# Patient Record
Sex: Female | Born: 2000
Health system: Southern US, Community
[De-identification: ages and names within clinical notes are randomized; demographics above are authoritative.]

## PROBLEM LIST (undated history)

## (undated) DIAGNOSIS — G40909 Epilepsy, unspecified, not intractable, without status epilepticus: Secondary | ICD-10-CM

## (undated) DIAGNOSIS — F32A Depression, unspecified: Secondary | ICD-10-CM

## (undated) DIAGNOSIS — R569 Unspecified convulsions: Secondary | ICD-10-CM

## (undated) HISTORY — PX: APPENDECTOMY (OPEN): SHX54

## (undated) HISTORY — DX: Depression, unspecified: F32.A

## (undated) HISTORY — DX: Epilepsy, unspecified, not intractable, without status epilepticus: G40.909

---
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
## 2020-05-30 NOTE — Congregational Nurse Program (Signed)
Patient mother came to see me requesting to see a doctor. States that patient has a "Cyst in the brain". Co-Sponsor reports developmental delay. Family requesting a referral to neurologist. I will call internal medicine to establish care with PCP. [name] RN BSn PCCN  [facility] [phone]-office

## 2020-06-20 ENCOUNTER — Ambulatory Visit (INDEPENDENT_AMBULATORY_CARE_PROVIDER_SITE_OTHER): Payer: Medicaid Other | Admitting: Student

## 2020-06-20 VITALS — BP 103/61 | HR 89 | Temp 98.7°F | Wt 117.7 lb

## 2020-06-20 DIAGNOSIS — Z0000 Encounter for general adult medical examination without abnormal findings: Secondary | ICD-10-CM

## 2020-06-20 DIAGNOSIS — G40909 Epilepsy, unspecified, not intractable, without status epilepticus: Secondary | ICD-10-CM

## 2020-06-20 DIAGNOSIS — F32A Depression, unspecified: Secondary | ICD-10-CM | POA: Diagnosis not present

## 2020-06-20 NOTE — Progress Notes (Signed)
   CC: Establish care, falls and shaking episodes   HPI:  [name] is a 20 y.o.with history below presents with mother and sponsor to establish as a new patient. Interview conducted with interpreter in the room. Please refer to problem based charting for further details and assessment and plan of current problem and chronic medical conditions.    Past Medical History:  Diagnosis Date  . Depression   . Seizure disorder (HCC)    Review of Systems:  Review of Systems  Constitutional: Negative for chills and fever.  HENT: Negative for congestion and sore throat.   Eyes: Positive for blurred vision. Negative for double vision and photophobia.  Respiratory: Negative for cough, shortness of breath and wheezing.   Cardiovascular: Negative for chest pain, palpitations and leg swelling.  Gastrointestinal: Negative for abdominal pain, blood in stool, diarrhea, nausea and vomiting.  Genitourinary: Negative for dysuria, frequency and hematuria.  Musculoskeletal: Negative for myalgias and neck pain.  Neurological: Positive for seizures and loss of consciousness. Negative for dizziness, sensory change, speech change, focal weakness and headaches.  Psychiatric/Behavioral: Positive for memory loss. Negative for depression and suicidal ideas. The patient does not have insomnia.      Physical Exam:  Vitals:   06/20/20 1000  BP: 103/61  Pulse: 89  Temp: 98.7 F (37.1 C)  TempSrc: Oral  SpO2: 100%  Weight: 117 lb 11.6 oz (53.4 kg)   Physical Exam Constitutional:      Appearance: Normal appearance.  HENT:     Head: Normocephalic and atraumatic.     Right Ear: External ear normal.     Left Ear: External ear normal.     Mouth/Throat:     Mouth: Mucous membranes are moist.     Pharynx: Oropharynx is clear.  Eyes:     Extraocular Movements: Extraocular movements intact.     Pupils: Pupils are equal, round, and reactive to light.  Cardiovascular:     Rate and Rhythm: Normal rate and  regular rhythm.     Pulses: Normal pulses.     Heart sounds: Normal heart sounds. No murmur heard.   Pulmonary:     Effort: Pulmonary effort is normal.     Breath sounds: Normal breath sounds. No wheezing.  Abdominal:     General: Abdomen is flat. Bowel sounds are normal. There is no distension.     Palpations: Abdomen is soft.     Tenderness: There is no abdominal tenderness.  Musculoskeletal:        General: No deformity or signs of injury. Normal range of motion.     Cervical back: Neck supple.     Right lower leg: No edema.     Left lower leg: No edema.  Neurological:     General: No focal deficit present.     Mental Status: She is alert and oriented to person, place, and time.     Cranial Nerves: No cranial nerve deficit.     Sensory: No sensory deficit.     Motor: No weakness.     Coordination: Coordination normal.     Gait: Gait normal.     Deep Tendon Reflexes: Reflexes normal.  Psychiatric:        Mood and Affect: Mood normal.        Behavior: Behavior normal.      Assessment & Plan:   See Encounters Tab for problem based charting.  Patient discussed with Dr. Antony Contras

## 2020-06-20 NOTE — Patient Instructions (Addendum)
It was a pleasure meeting you in clinic.   Today we discussed falls and shaking: I am concerned Sara Moon is having seizures that is causing this. Once we have her insurance information we will refer her to see a Neurologist and have and MRI. I have ordered lab work today and will call you with the results. I will start her on medication depending on the lab results and we will talk about it when I call you.  Please bring you paperwork for vaccines and other tests to you next visit. Please bring your insurance information if you have it too.  We can discuss having a Pap smear at our next visit as well.  Please come back for a visit in 2 weeks.   If you have any questions or concerns, please call our clinic at [phone] between 9am-5pm and after hours call [phone] and ask for the internal medicine resident on call. If you feel you are having a medical emergency please call 911.   Thank you, we look forward to helping you remain healthy!

## 2020-06-21 LAB — BMP8+ANION GAP
Anion Gap: 16 mmol/L (ref 10.0–18.0)
BUN/Creatinine Ratio: 23 (ref 9–23)
BUN: 14 mg/dL (ref 6–20)
CO2: 22 mmol/L (ref 20–29)
Calcium: 10.1 mg/dL (ref 8.7–10.2)
Chloride: 103 mmol/L (ref 96–106)
Creatinine, Ser: 0.6 mg/dL (ref 0.57–1.00)
GFR calc Af Amer: 151 mL/min/{1.73_m2} (ref >59)
GFR calc non Af Amer: 131 mL/min/{1.73_m2} (ref >59)
Glucose: 90 mg/dL (ref 65–99)
Potassium: 4.6 mmol/L (ref 3.5–5.2)
Sodium: 141 mmol/L (ref 134–144)

## 2020-06-21 MED ORDER — LEVETIRACETAM 500 MG PO TABS: 500.0000 mg | ORAL_TABLET | Freq: Two times a day (BID) | ORAL | 2 refills | Status: DC

## 2020-06-21 MED FILL — levETIRAcetam 500 MG TABS: 500 | 30 days supply | Qty: 60 | Fill #0

## 2020-06-23 ENCOUNTER — Encounter: Payer: Self-pay | Admitting: Student

## 2020-06-23 DIAGNOSIS — Z0000 Encounter for general adult medical examination without abnormal findings: Secondary | ICD-10-CM | POA: Insufficient documentation

## 2020-06-23 DIAGNOSIS — F32A Depression, unspecified: Secondary | ICD-10-CM | POA: Insufficient documentation

## 2020-06-23 DIAGNOSIS — G40909 Epilepsy, unspecified, not intractable, without status epilepticus: Secondary | ICD-10-CM | POA: Insufficient documentation

## 2020-06-23 NOTE — Assessment & Plan Note (Signed)
Patient with history of depression and prescribed medication for this while in [place] 2 years ago. Did not take the medications. Mother is concerned that patient is not as outgoing and prefers bing by self. Today states she is a happy person with much family support and positive outlook on life. Denies feeling depressed. States she has difficulty with criticism from family and is sometimes upset easily. Will revisit this as she adjusts to new living situation and have her complete PHQ9 and GAD at next visit.

## 2020-06-23 NOTE — Assessment & Plan Note (Signed)
Reports vaccines administered when entering country will bring records to next visit  Deferred pap smear at this visit, will revisit this

## 2020-06-23 NOTE — Assessment & Plan Note (Signed)
Patient is a refugee from [place] who presents with mother and sponsor today to establish care. Per mother patient has had episodes since she was 20 years old where she falls and has lapses in her memory. Patient states she sometimes feels that her vision is darkening and she suddenly become SOB with leg tingling prior to this, which is very frightening to her. She often has no memory of these episodes and is surprised when family talks bout it later. This most recently happened yesterday after her mother heard a thud in another room and found the patient laying on her back.. These episodes happen 1-2 times a day as last between 2-5 minutes. Today she denies any pain or injuries from falling. Generally mother tries to keep near her and keep her from falling and injuring herself.   Her mother also notes that's she also has episodes where she observes her daughter with shaking in her right arm and patient is unresponsive, during this time patient has had urinary incontinence. After the shaking stops patient has not knowledge of this and is confused by what has happened. This also happens about once a day. Mother feels that the patient has more episodes when patient is excited or agitated and staying calm helps prevent them.  Patient has multiple tests for these episodes including a heart cath in 2008 that was negative. She had a MRI in 2019 in [place] showing a 79mm small subarachnoid cyst. Mother was told to keep the patient calm but otherwise not started on any medications. While living in a refugee camp in [place] between August and November of 2021 patient was taken to a hospital 5 times for her episodes of shaking, mother states no workup was done due her refugee status.  Patient denies chest pain, dyspnea, exertional SOB, nausea, vomiting, weakness, dizziness, or headache. Denies prior facial injuries or fractures from falling or tongue biting with shaking episodes. Given her lack of awareness of the  shaking episodes and urinary incontinence suspect this may be a seizure disorder vs pseudoseizures. Do not think this is a cardiac issues. Patient currently uninsured but anticipates getting medicaid soon. Will check renal function and start on keppra if normal. Further workup with MRI and referral to neurology once she get insurance.   Plan BMP today wnl Start keppra 500 mg twice daily Follow up in 2 weeks

## 2020-06-25 NOTE — Progress Notes (Signed)
Internal Medicine Attending Note:  This patient's plan of care was discussed with the house staff. Please see their note for complete details. I concur with their findings.  Patient is here with history very concerning for seizures / epilepsy. She has frequent daily episodes of unresponsiveness with associated rhythmic shaking and urinary incontinence. Episodes started at age 20 and usually last a couple of minutes. History is limited due to language barrier but it sounds as if she might be post ictal following episodes as well. She has no memory of event afterwards. She is a refugee from [place] and currently uninsured. She is in the process of applying for medicaid. Pending approval, she will need urgent referral to neurology and neuroimaging. She did have an MRI done in [place] in 2019 and mother brought a copy of the report. 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 We have started Keppra BID today ($4 list at [facility] outpatient pharmacy) and she will need close follow up.   Reymundo Poll, MD 06/25/2020, 11:00 AM

## 2020-07-04 ENCOUNTER — Ambulatory Visit (INDEPENDENT_AMBULATORY_CARE_PROVIDER_SITE_OTHER): Payer: Medicaid Other | Admitting: Student

## 2020-07-04 ENCOUNTER — Other Ambulatory Visit: Payer: Self-pay

## 2020-07-04 ENCOUNTER — Other Ambulatory Visit: Payer: Self-pay | Admitting: Student

## 2020-07-04 ENCOUNTER — Encounter: Payer: Self-pay | Admitting: Student

## 2020-07-04 VITALS — BP 109/67 | HR 64 | Temp 98.4°F | Ht 65.0 in | Wt 116.1 lb

## 2020-07-04 DIAGNOSIS — Z0000 Encounter for general adult medical examination without abnormal findings: Secondary | ICD-10-CM

## 2020-07-04 DIAGNOSIS — G40909 Epilepsy, unspecified, not intractable, without status epilepticus: Secondary | ICD-10-CM

## 2020-07-04 MED ORDER — DIVALPROEX SODIUM 500 MG PO DR TAB: 500.0000 mg | DELAYED_RELEASE_TABLET | Freq: Two times a day (BID) | ORAL | 2 refills | Status: DC

## 2020-07-04 MED FILL — DIVALPROEX SOD DR 500 MG TA: 500 | 30 days supply | Qty: 60 | Fill #0

## 2020-07-04 NOTE — Progress Notes (Signed)
   CC: follow up of seizure like episodes  HPI:  [name] is a 20 y.o. with history below presents for follow up of seizure like episodes. Please refer to problem based charting for further details and assessment and plan of current problem and chronic medical conditions.   Past Medical History:  Diagnosis Date  . Concern for seizure disorder (HCC)   . Depression    Review of Systems:  Negative as per HPI  Physical Exam:  Vitals:   07/04/20 0918 07/04/20 1016  BP: (!) 82/66 109/67  Pulse: 86 64  Temp: 98.4 F (36.9 C)   TempSrc: Oral   SpO2: 100%   Weight: 116 lb 1.6 oz (52.7 kg)   Height: 5\' 5"  (1.651 m)    Physical Exam Constitutional:      Appearance: Normal appearance.  HENT:     Head: Normocephalic and atraumatic.     Right Ear: External ear normal.     Left Ear: External ear normal.     Mouth/Throat:     Mouth: Mucous membranes are moist.     Pharynx: Oropharynx is clear.  Eyes:     Extraocular Movements: Extraocular movements intact.     Pupils: Pupils are equal, round, and reactive to light.  Cardiovascular:     Rate and Rhythm: Normal rate and regular rhythm.     Pulses: Normal pulses.     Heart sounds: Normal heart sounds.  Pulmonary:     Effort: Pulmonary effort is normal.     Breath sounds: Normal breath sounds. No wheezing.  Abdominal:     General: Abdomen is flat. Bowel sounds are normal.     Palpations: Abdomen is soft.  Musculoskeletal:        General: No signs of injury. Normal range of motion.  Skin:    General: Skin is warm and dry.     Capillary Refill: Capillary refill takes less than 2 seconds.  Neurological:     Mental Status: She is alert and oriented to person, place, and time.     Cranial Nerves: Dysarthria present.     Sensory: Sensation is intact.     Motor: Motor function is intact. No weakness.     Coordination: Romberg sign negative. Coordination normal.     Gait: Gait abnormal (intermittent squats down when stepping  with the right foot).     Deep Tendon Reflexes:     Reflex Scores:      Bicep reflexes are 2+ on the right side and 2+ on the left side.      Brachioradialis reflexes are 2+ on the right side and 2+ on the left side.      Patellar reflexes are 2+ on the right side and 2+ on the left side.      Achilles reflexes are 2+ on the right side and 2+ on the left side.    Comments: Multiple episodes where patient jerks both arms over hands and extends her next during exam.   Psychiatric:        Behavior: Behavior normal.      Assessment & Plan:   See Encounters Tab for problem based charting.  Patient discussed with Dr. 

## 2020-07-04 NOTE — Patient Instructions (Addendum)
It was a pleasure seeing you in clinic. Today we discussed:   Seizure episodes: Please start taking depakote 500 mg twice a day. Do not take anymore of the keppra from before.   I have sent a referral to neurology to further discuss your shaking episodes.  I have ordered labs to monitor you liver function and you will need to follow up in 1 to 2 weeks to recheck your liver function.   If you have any questions or concerns, please call our clinic at [phone] between 9am-5pm and after hours call [phone] and ask for the internal medicine resident on call. If you feel you are having a medical emergency please call 911.   Thank you, we look forward to helping you remain healthy!

## 2020-07-05 LAB — CMP14 + ANION GAP
ALT: 7 IU/L (ref 0–32)
AST: 17 IU/L (ref 0–40)
Albumin/Globulin Ratio: 2.3 — ABNORMAL HIGH (ref 1.2–2.2)
Albumin: 5.3 g/dL — ABNORMAL HIGH (ref 3.9–5.0)
Alkaline Phosphatase: 54 IU/L (ref 44–121)
Anion Gap: 13 mmol/L (ref 10.0–18.0)
BUN/Creatinine Ratio: 20 (ref 9–23)
BUN: 12 mg/dL (ref 6–20)
Bilirubin Total: 0.8 mg/dL (ref 0.0–1.2)
CO2: 24 mmol/L (ref 20–29)
Calcium: 10.1 mg/dL (ref 8.7–10.2)
Chloride: 102 mmol/L (ref 96–106)
Creatinine, Ser: 0.59 mg/dL (ref 0.57–1.00)
GFR calc Af Amer: 152 mL/min/{1.73_m2} (ref >59)
GFR calc non Af Amer: 131 mL/min/{1.73_m2} (ref >59)
Globulin, Total: 2.3 g/dL (ref 1.5–4.5)
Glucose: 91 mg/dL (ref 65–99)
Potassium: 4.8 mmol/L (ref 3.5–5.2)
Sodium: 139 mmol/L (ref 134–144)
Total Protein: 7.6 g/dL (ref 6.0–8.5)

## 2020-07-05 LAB — CBC
Hematocrit: 37.7 % (ref 34.0–46.6)
Hemoglobin: 12.6 g/dL (ref 11.1–15.9)
MCH: 28.2 pg (ref 26.6–33.0)
MCHC: 33.4 g/dL (ref 31.5–35.7)
MCV: 84 fL (ref 79–97)
Platelets: 302 10*3/uL (ref 150–450)
RBC: 4.47 x10E6/uL (ref 3.77–5.28)
RDW: 13.1 % (ref 11.7–15.4)
WBC: 5.1 10*3/uL (ref 3.4–10.8)

## 2020-07-05 NOTE — Progress Notes (Signed)
Internal Medicine Clinic Attending ? ?Case discussed with Dr. Liang  At the time of the visit.  We reviewed the resident?s history and exam and pertinent patient test results.  I agree with the assessment, diagnosis, and plan of care documented in the resident?s note. ? ?

## 2020-07-05 NOTE — Assessment & Plan Note (Signed)
Patient brought vaccine documentation to this visit. Has had single dose of COVID vaccine and discussed need to get her second dose.

## 2020-07-05 NOTE — Assessment & Plan Note (Addendum)
Patient presents for follow up for episodes of shaking. She was started on Levetiracetam 500 mg twice a day and has not had any more episodes of shaking. However notes she has not developed intermittent right leg to foot numbness and sensation of left hand rigidity since starting the medication. These episodes of numbness and hand rigidity last a few minutes before she feels back to normal. States numbness in leg and foot make it difficult to walk and she need to squat down as she walks as she feels off balance.  In addition, 3 days ago she started having episodes of jerking of both arms over her head. Mother is also concern that the patient seesm more restless and agitated than before, she continues to have episodes where she forgets thing such as forgetting to put on clothes after showering. She and mother are concerned that these new symptoms are due to the new medication. Today she states she is not having any leg numbness, but feels that her left hand and wrist feels stiff and difficult to move. Patient denies fevers, chills, chest pain, recent falls, weakness, dizziness, changes in speech, or LOC.   On exam patient appears restless and intermittently gets up to pace however occasionally squats does when using her left leg. She was observed have 4-5 sudden brief episodes  throw both hands up in the air and extend her neck during the exam. This was motion was also elicited when she was startled when placing a tuning fork on her left hand. Was not able to elicit rigidity with ROM of the upper extremities.  Sensation to cold, proprioception, and vibration intact of bilateral lower extremities.  Patient's medicaid status is still pending. Mother expressed frustration patient has not been abled to see an neurologist. Discussed that I will make a referral to neurology today given patient is anticipated to have insurance in the near future and speak to the on call neurologist at the hospital today. Discussed case  with on call neurologist Dr. Amada Jupiter and concern that her new symptoms may be side effects of Keppra. Was advised that dystonic movements are very rarely associated with Keppra. Dr. Amada Jupiter expressed that patient may be having focal seizures with secondary generalization and the medication may be preventing secondary generalization and her current symptoms may represents residual symptoms of focal seizures. Recommended that she either be increased on her dose of keppra vs switching to Depakote 500 mg twice a daily. Did not that this is was a neurological emergency and did not need repeat imaging given recent MRI in 2019.   Discussed options with patient and her mother who would prefer switching to depakote.  Plan Start Depakote 500 mg twice daily CBC, CMP today WNL Referral to neurology Follow up in 2 weeks will need repeat CMP at that time

## 2020-07-09 ENCOUNTER — Ambulatory Visit: Payer: Medicaid Other | Admitting: Diagnostic Neuroimaging

## 2020-07-09 ENCOUNTER — Encounter: Payer: Self-pay | Admitting: Diagnostic Neuroimaging

## 2020-07-09 ENCOUNTER — Telehealth: Payer: Self-pay | Admitting: Diagnostic Neuroimaging

## 2020-07-09 ENCOUNTER — Other Ambulatory Visit: Payer: Self-pay

## 2020-07-09 VITALS — BP 111/65 | Temp 100.0°F | Ht 65.0 in | Wt 114.8 lb

## 2020-07-09 DIAGNOSIS — G40909 Epilepsy, unspecified, not intractable, without status epilepticus: Secondary | ICD-10-CM | POA: Diagnosis not present

## 2020-07-09 DIAGNOSIS — R6250 Unspecified lack of expected normal physiological development in childhood: Secondary | ICD-10-CM

## 2020-07-09 NOTE — Patient Instructions (Signed)
  developmental delay / seizure disorder / behavior spells - check MRI brain, EEG - start divalproex 500mg  daily; after 1-2 weeks increase to 500mg  twice a day   depression / PTSD - consider therapist / counselor

## 2020-07-09 NOTE — Progress Notes (Signed)
[facility]  PATIENT: Sara Moon DOB: Sep 07, 2000  REFERRING CLINICIAN: Tyson Alias,* HISTORY FROM: patient  REASON FOR VISIT: new consult    HISTORICAL  CHIEF COMPLAINT:  Chief Complaint  Patient presents with   Seizures    Rm 7 New Pt mom- [place], interpreter- Mahin "patient started on Levertiracetam but was having side effects, Divalproex ordered but hasn't started taking"     HISTORY OF PRESENT ILLNESS:   20 year old female here for evaluation of abnormal spells.  Patient accompanied by mother, Nurse, learning disability, and sponsor.  Patient was born in [place] in 2002, with normal uncomplicated birth.  However she had developmental delay and did not start talking or walking until age 89 years old.  Even at that time her language abilities were limited.  Eventually she was able to attend school and complete through 11th grade.  However she only attained seventh grade ability.    At age 76 years old patient was having some abnormal spells of eye blinking, freezing, falling down.  Her family took her to [place] for evaluation and patient had MRI, EEG and neurology evaluation.  No specific diagnosis was made but patient was started on a medication for about 6 months which seems to help.  Mother thinks this might have been an antiseizure medication but is not sure.  Patient returned to [place], completed medication for 6 months and then stopped it.  At age 19 years old patient had another evaluation in [place] at a different hospital, with MRI and EEG.  At that time she was diagnosed with a small arachnoid cyst, which is thought to be an incidental finding.  She was again started on some type of medication for about 6 months, possibly antiseizure medication.  She was recommended to follow-up, but could not because of Covid pandemic.  Patient came to the [place] as a refugee, initially to [place] and then to [place].  She saw internal medicine clinic  in [place], was started on levetiracetam but this caused some side effect and abnormal movements.  Medication was stopped and patient referred here for further evaluation.  Patient having at least two episodes per day of memory lapse, abnormal involuntary movements, almost falling down, falling down, confusion.  Patient had several of these episodes during her evaluation today, with abnormal flinging movements of her arm, almost falling down but then catching herself, without confusion or memory lapse.  These did not seem like seizure events but more like behavioral spells.   REVIEW OF SYSTEMS: Full 14 system review of systems performed and negative with exception of: As per HPI.  ALLERGIES: No Known Allergies  HOME MEDICATIONS: Outpatient Medications Prior to Visit  Medication Sig Dispense Refill   divalproex (DEPAKOTE) 500 MG DR tablet Take 1 tablet (500 mg total) by mouth 2 (two) times daily. (Patient not taking: Reported on 07/09/2020) 60 tablet 2   No facility-administered medications prior to visit.    PAST MEDICAL HISTORY: Past Medical History:  Diagnosis Date   Concern for seizure disorder [facility])    Depression     PAST SURGICAL HISTORY: Past Surgical History:  Procedure Laterality Date   APPENDECTOMY  2008    FAMILY HISTORY: No family history on file.  SOCIAL HISTORY: Social History   Socioeconomic History   Marital status: Single    Spouse name: Not on file   Number of children: 0   Years of education: Not on file   Highest education level: Not on file  Occupational History  Not on file  Tobacco Use   Smoking status: Never Smoker   Smokeless tobacco: Never Used  Vaping Use   Vaping Use: Never used  Substance and Sexual Activity   Alcohol use: Never   Drug use: Never   Sexual activity: Never    Birth control/protection: None  Other Topics Concern   Not on file  Social History Narrative   Lives with mother and father and older brother  in [place]. Sixth of seven children. Other siblings in refugee camps across [place] and [place] anticipate will be joining family soon. Currently taking English classes    Social Determinants of Health   Financial Resource Strain: Not on file  Food Insecurity: Not on file  Transportation Needs: Not on file  Physical Activity: Not on file  Stress: Not on file  Social Connections: Not on file  Intimate Partner Violence: Not on file     PHYSICAL EXAM  GENERAL EXAM/CONSTITUTIONAL: Vitals:  Vitals:   07/09/20 1106  BP: 111/65  Temp: 100 F (37.8 C)  Weight: 114 lb 12.8 oz (52.1 kg)  Height: 5\' 5"  (1.651 m)     Body mass index is 19.1 kg/m. Wt Readings from Last 3 Encounters:  07/09/20 114 lb 12.8 oz (52.1 kg)  07/04/20 116 lb 1.6 oz (52.7 kg)  06/20/20 117 lb 11.6 oz (53.4 kg)     Patient is in no distress; well developed, nourished and groomed; neck is supple  CARDIOVASCULAR:  Examination of carotid arteries is normal; no carotid bruits  Regular rate and rhythm, no murmurs  Examination of peripheral vascular system by observation and palpation is normal  EYES:  Ophthalmoscopic exam of optic discs and posterior segments is normal; no papilledema or hemorrhages  No exam data present  MUSCULOSKELETAL:  Gait, strength, tone, movements noted in Neurologic exam below  NEUROLOGIC: MENTAL STATUS:  No flowsheet data found.  awake, alert, oriented to person, place and time  recent and remote memory intact  normal attention and concentration  language fluent, comprehension intact, naming intact  fund of knowledge appropriate  CRANIAL NERVE:   2nd - no papilledema on fundoscopic exam  2nd, 3rd, 4th, 6th - pupils equal and reactive to light, visual fields full to confrontation, extraocular muscles intact, no nystagmus  5th - facial sensation symmetric  7th - facial strength symmetric  8th - hearing intact  9th - palate elevates symmetrically, uvula  midline  11th - shoulder shrug symmetric  12th - tongue protrusion midline  MOTOR:   normal bulk and tone, full strength in the BUE, BLE  SENSORY:   normal and symmetric to light touch, temperature, vibration  COORDINATION:   finger-nose-finger, fine finger movements normal  REFLEXES:   deep tendon reflexes present and symmetric  GAIT/STATION:   narrow based gait     DIAGNOSTIC DATA (LABS, IMAGING, TESTING) - I reviewed patient records, labs, notes, testing and imaging myself where available.  Lab Results  Component Value Date   WBC 5.1 07/04/2020   HGB 12.6 07/04/2020   HCT 37.7 07/04/2020   MCV 84 07/04/2020   PLT 302 07/04/2020      Component Value Date/Time   NA 139 07/04/2020 1126   K 4.8 07/04/2020 1126   CL 102 07/04/2020 1126   CO2 24 07/04/2020 1126   GLUCOSE 91 07/04/2020 1126   BUN 12 07/04/2020 1126   CREATININE 0.59 07/04/2020 1126   CALCIUM 10.1 07/04/2020 1126   PROT 7.6 07/04/2020 1126   ALBUMIN 5.3 (H)  07/04/2020 1126   AST 17 07/04/2020 1126   ALT 7 07/04/2020 1126   ALKPHOS 54 07/04/2020 1126   BILITOT 0.8 07/04/2020 1126   GFRNONAA 131 07/04/2020 1126   GFRAA 152 07/04/2020 1126   No results found for: CHOL, HDL, LDLCALC, LDLDIRECT, TRIG, CHOLHDL No results found for: RJJO8C No results found for: VITAMINB12 No results found for: TSH     ASSESSMENT AND PLAN  20 y.o. year old female here with developmental delay, possible seizure disorder, abnormal behavior spells, depression and PTSD.  Dx:  1. Developmental delay   2. Seizure disorder (HCC)     PLAN:  developmental delay / seizure disorder / behavior spells - check MRI brain, EEG - start divalproex 500mg  daily; after 1-2 weeks increase to 500mg  twice a day   depression / PTSD - consider therapist / counselor  Orders Placed This Encounter  Procedures   MR BRAIN W WO CONTRAST   EEG adult   Return in about 3 months (around 10/07/2020).    , MD 07/09/2020, 12:05 PM Certified in Neurology, Neurophysiology and Neuroimaging  [facility] [address] [place], [address] [place] [phone]

## 2020-07-09 NOTE — Telephone Encounter (Signed)
medicaid order sent to GI. No auth they will reach out to the patient.

## 2020-07-11 ENCOUNTER — Encounter: Payer: Self-pay | Admitting: Student

## 2020-07-19 ENCOUNTER — Telehealth: Payer: Self-pay | Admitting: Diagnostic Neuroimaging

## 2020-07-19 NOTE — Telephone Encounter (Signed)
Pt's sponsor, Jamey Ripa (on [place]) called,requesting physician to prescribe medication for her to take before her MRI on 2/10. Would like a call from the nurse.

## 2020-07-22 ENCOUNTER — Other Ambulatory Visit: Payer: Self-pay | Admitting: Diagnostic Neuroimaging

## 2020-07-22 MED ORDER — ALPRAZOLAM 0.5 MG PO TABS: ORAL_TABLET | ORAL | 0 refills | Status: DC

## 2020-07-22 MED FILL — ALPRAZolam 0.5 MG TABS: 0.5 | 1 days supply | Qty: 3 | Fill #0

## 2020-07-22 NOTE — Telephone Encounter (Signed)
Called Sara Moon and informed her of Rx sent in. She  verbalized understanding, appreciation.

## 2020-07-22 NOTE — Telephone Encounter (Signed)
Meds ordered this encounter  Medications  . ALPRAZolam (XANAX) 0.5 MG tablet    Sig: for sedation before MRI scan; take 1 tab 1 hour before scan; may repeat 1 tab 15 min before scan    Dispense:  3 tablet    Refill:  0    [name], MD 07/22/2020, 4:27 PM Certified in Neurology, Neurophysiology and Neuroimaging  [facility] [address], [address] [place], [place] [address] [phone]

## 2020-07-23 NOTE — Congregational Nurse Program (Signed)
Patient was seen today with her mother complaining of her heart beating fast and out of her chest. The patient has a history of a potential seizure disorder. She was recently switched from Levetiracetam to Depakote on 07/04/2020. Last seizure was 1 month ago.   According to the patient's mother she stopped the Depakote and has not taken it today because she felt like her heart was pounding. She denies any nausea, fever, or chest pain. No headache. No abdominal pain. The patient had 3 episodes of arm shaking and then falling yesterday. No LOC per mother. Afterwards, the patient for 10 minutes did not remember where she was and did not recognize her mother.   Blood pressure was checked today and was within normal limits at 101/60. Pulse was 86. Patient was counseled on stress relief techniques. Patient has an MRI scheduled for 2/10 at 2pm as well as follow up with her PCP that day at 9:45am.  [name] RN B[facility]  [facility] [phone]-cell [phone]-office

## 2020-07-24 ENCOUNTER — Telehealth: Payer: Self-pay

## 2020-07-24 ENCOUNTER — Telehealth: Payer: Self-pay | Admitting: Diagnostic Neuroimaging

## 2020-07-24 ENCOUNTER — Telehealth: Payer: Self-pay | Admitting: *Deleted

## 2020-07-24 NOTE — Telephone Encounter (Signed)
Pt.'s sponsor [name] is on [place]. She states that pt is having serious problems, she is having memory issues. Pt. is not eating, she states that pt. has stopped medication without consulting doctor first. She is concerned about pt.'s condition & is thinking of taking her to the ER. Please advise.

## 2020-07-24 NOTE — Telephone Encounter (Signed)
Called Sara Moon who stated the mother stopped giving patient depakote 6 days ago do to loss of appetite and "worry over her liver function". Mother called [name] today stating daughters not doing well, now has agitation, insomnia, memory loss.  Sara Moon asked if she could be having withdrawal form depakote, is unsure what to do. Patient has appointment tomorrow with Dr Maryla Morrow, Internal med. She also has MRI tomorrow. [name] also asked if it'll be okay to give patient Xanax before MRI tomorrow. I advised will discuss with Dr Marjory Lies and let her know. She verbalized understanding, appreciation.

## 2020-07-24 NOTE — Telephone Encounter (Signed)
Aqree thank you

## 2020-07-24 NOTE — Telephone Encounter (Addendum)
Called Maura, LVM advising her of Dr [employer] message, also advised per Dr Marjory Lies patient may take Xanax before MRI. Left # for questions.

## 2020-07-24 NOTE — Telephone Encounter (Signed)
Sara Moon called back stating mother says daughter needs to see a Dr today.  Stated she thinks daughter is hallucinating, caregiver asking what to do.  I advised if mother doesn't think they can wait until her PCP appointment tomorrow morning then she can call PCP or go to Urgent Care or ED. Maura verbalized understanding, appreciation.

## 2020-07-24 NOTE — Telephone Encounter (Signed)
Patient sponsor called me seeking advice related to non compliance with  Medication.Sponsor states that patient mother stopped anti-seizure medication because  the patient was not eating enough.she also stated that patient symptoms are exacerbated. Sponsor informed that it is not advisable to stop anti seizure medication without doctor`s advise. Sponsor advised to inform family to resume medication to prevent seizure occurrence and encourage patient to eat small frequent meals. Noted patient has appointment tomorrow and should discuss this with the doctor.   [name] RN [facility]  [facility] [phone]-cell [phone]-office

## 2020-07-24 NOTE — Telephone Encounter (Signed)
Maura, patient's co-sponsor, called in stating patient was referred to Neurology who started her on a med but patient stopped taking it 3 days ago and now her "sx are exacerbated." Patient has MRI scheduled for tomorrow. [name] is advised to call neurology now to advise. She is in agreement. Kinnie Feil, BSN, RN-BC

## 2020-07-24 NOTE — Telephone Encounter (Signed)
Stay off depakote. Follow up with PCP and may need to get lab testing. -VRP

## 2020-07-24 NOTE — Telephone Encounter (Addendum)
Received call from [facility] who stated mother called her, is worried about her daughter having psychosis. Mother is  wanting something to help her sleep. I advised Dr Marjory Lies will not prescribe sleeping medicine, but she may take OTC sleep aids. Sara Moon  verbalized understanding, appreciation.

## 2020-07-25 ENCOUNTER — Emergency Department (HOSPITAL_COMMUNITY): Payer: Medicaid Other

## 2020-07-25 ENCOUNTER — Ambulatory Visit: Payer: Medicaid Other | Admitting: Internal Medicine

## 2020-07-25 ENCOUNTER — Encounter: Payer: Self-pay | Admitting: Internal Medicine

## 2020-07-25 ENCOUNTER — Emergency Department (HOSPITAL_COMMUNITY)
Admission: EM | Admit: 2020-07-25 | Discharge: 2020-07-31 | Disposition: A | Discharge status: Home / Self Care | Payer: Medicaid Other | Attending: Emergency Medicine | Admitting: Emergency Medicine

## 2020-07-25 ENCOUNTER — Encounter (HOSPITAL_COMMUNITY): Payer: Self-pay | Admitting: Emergency Medicine

## 2020-07-25 ENCOUNTER — Other Ambulatory Visit: Payer: Self-pay

## 2020-07-25 ENCOUNTER — Ambulatory Visit: Payer: Self-pay

## 2020-07-25 VITALS — BP 111/69 | HR 123 | Temp 99.0°F | Ht 65.0 in | Wt 113.9 lb

## 2020-07-25 DIAGNOSIS — F329 Major depressive disorder, single episode, unspecified: Secondary | ICD-10-CM | POA: Insufficient documentation

## 2020-07-25 DIAGNOSIS — R000 Tachycardia, unspecified: Secondary | ICD-10-CM | POA: Diagnosis not present

## 2020-07-25 DIAGNOSIS — Z20822 Contact with and (suspected) exposure to covid-19: Secondary | ICD-10-CM | POA: Insufficient documentation

## 2020-07-25 DIAGNOSIS — F203 Undifferentiated schizophrenia: Secondary | ICD-10-CM | POA: Diagnosis not present

## 2020-07-25 DIAGNOSIS — Z046 Encounter for general psychiatric examination, requested by authority: Secondary | ICD-10-CM | POA: Insufficient documentation

## 2020-07-25 DIAGNOSIS — F29 Unspecified psychosis not due to a substance or known physiological condition: Secondary | ICD-10-CM | POA: Diagnosis not present

## 2020-07-25 DIAGNOSIS — F23 Brief psychotic disorder: Secondary | ICD-10-CM

## 2020-07-25 LAB — I-STAT BETA HCG BLOOD, ED (MC, WL, AP ONLY): I-stat hCG, quantitative: <5 m[IU]/mL (ref <5)

## 2020-07-25 LAB — COMPREHENSIVE METABOLIC PANEL
ALT: 13 U/L (ref 0–44)
AST: 16 U/L (ref 15–41)
Albumin: 5 g/dL (ref 3.5–5.0)
Alkaline Phosphatase: 39 U/L (ref 38–126)
Anion gap: 11 (ref 5–15)
BUN: 13 mg/dL (ref 6–20)
CO2: 24 mmol/L (ref 22–32)
Calcium: 9.5 mg/dL (ref 8.9–10.3)
Chloride: 104 mmol/L (ref 98–111)
Creatinine, Ser: 0.51 mg/dL (ref 0.44–1.00)
GFR, Estimated: >60 mL/min (ref >60)
Glucose, Bld: 99 mg/dL (ref 70–99)
Potassium: 3.3 mmol/L — ABNORMAL LOW (ref 3.5–5.1)
Sodium: 139 mmol/L (ref 135–145)
Total Bilirubin: 1 mg/dL (ref 0.3–1.2)
Total Protein: 8.4 g/dL — ABNORMAL HIGH (ref 6.5–8.1)

## 2020-07-25 LAB — CBC
HCT: 38.2 % (ref 36.0–46.0)
Hemoglobin: 13.1 g/dL (ref 12.0–15.0)
MCH: 29.4 pg (ref 26.0–34.0)
MCHC: 34.3 g/dL (ref 30.0–36.0)
MCV: 85.8 fL (ref 80.0–100.0)
Platelets: 321 10*3/uL (ref 150–400)
RBC: 4.45 MIL/uL (ref 3.87–5.11)
RDW: 13.7 % (ref 11.5–15.5)
WBC: 7.2 10*3/uL (ref 4.0–10.5)
nRBC: 0 % (ref 0.0–0.2)

## 2020-07-25 LAB — SALICYLATE LEVEL: Salicylate Lvl: <7 mg/dL — ABNORMAL LOW (ref 7.0–30.0)

## 2020-07-25 LAB — ETHANOL: Alcohol, Ethyl (B): <10 mg/dL (ref <10)

## 2020-07-25 LAB — ACETAMINOPHEN LEVEL: Acetaminophen (Tylenol), Serum: <10 ug/mL — ABNORMAL LOW (ref 10–30)

## 2020-07-25 MED ORDER — ZIPRASIDONE MESYLATE 20 MG IM SOLR
INTRAMUSCULAR | Status: AC
  Filled 2020-07-25: qty 20

## 2020-07-25 MED ORDER — LORAZEPAM 2 MG/ML IJ SOLN
1.0000 mg | Freq: Once | INTRAMUSCULAR | Status: DC
  Filled 2020-07-25: qty 1

## 2020-07-25 MED ORDER — STERILE WATER FOR INJECTION IJ SOLN
INTRAMUSCULAR | Status: AC
  Filled 2020-07-25: qty 10

## 2020-07-25 MED ORDER — LORAZEPAM 2 MG/ML IJ SOLN
2.0000 mg | Freq: Once | INTRAMUSCULAR | Status: AC
  Administered 2020-07-25: 2 mg via INTRAMUSCULAR
  Filled 2020-07-25: qty 1

## 2020-07-25 MED ORDER — LORAZEPAM 1 MG PO TABS
1.0000 mg | ORAL_TABLET | Freq: Once | ORAL | Status: DC
  Filled 2020-07-25: qty 1

## 2020-07-25 MED ORDER — ZIPRASIDONE MESYLATE 20 MG IM SOLR
10.0000 mg | Freq: Once | INTRAMUSCULAR | Status: AC
  Administered 2020-07-25: 10 mg via INTRAMUSCULAR

## 2020-07-25 NOTE — ED Notes (Signed)
Patient screaming out, patient trying to pull at restraints with teeth.
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
## 2020-07-25 NOTE — BH Assessment (Signed)
TTS:  Attempted 2028--pt had MRI done and is now sleeping (sedated). Per pt nurse, Colin Mulders, RN--will send secure chat once pt is awake and able to participate in assessment.  Farsi interpreter needed  Weyman Pedro, MSW, LCSW Outpatient Therapist/Triage Specialist
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
## 2020-07-25 NOTE — ED Notes (Signed)
Patient screaming out water, water offered and patient shakes her head and refuses. Patient rocking head back and forth
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
## 2020-07-25 NOTE — Patient Instructions (Addendum)
 ?~    ?    ?    ? ӁӐ?.   ? ?Ԑ ?    . ǐ ? ??   ?   ? ?? ?    [facility] long ?. Depakote    ?  ? ?      ~?.       ?  ?  2  ?   ~??~  ~?.  ?     ?   Z  ~?.      ? ?  ~??~ ?   [phone]  ??.  ~!   Thank you for allowing [place] to provide your care today.  I have ordered some labs for you. I will call if any are abnormal.   Please go to the [facility] long emergency room for further evaluation.  Do not take Depakote until further notice and more recommendation from neurology.   Please come back to clinic in 2 weeks or earlier if your symptoms get worse or not improved. As always, if having severe symptoms, please seek medical attention at emergency room. Should you have any questions or concerns please call the internal medicine clinic at [phone].    Thank you!

## 2020-07-25 NOTE — ED Notes (Signed)
Went to administered Ativan and patient refused. Tried to get mother to leave room to see if she would take medication and she followed mother out. Pt states " I am so sorry" over and over. Sister called and asked patient to take medication and patient still refused. PA notified
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
## 2020-07-25 NOTE — ED Notes (Signed)
Sara Moon [phone] to [place] to her and her family.

## 2020-07-25 NOTE — ED Notes (Signed)
Pt given dinner tray.

## 2020-07-25 NOTE — ED Provider Notes (Signed)
I provided a substantive portion of the care of this patient.  I personally performed the entirety of the medical decision making for this encounter.    20 year old female presents with acute psychosis.  Interpreter phone was used to communicate with her as he speaks [place].  Patient required sedation with Geodon as well as restraint.  Placed under IVC.  We will have psychiatry see   Lorre Nick, MD 07/25/20 [phone]

## 2020-07-25 NOTE — ED Notes (Signed)
Patient and mother states recent CT scan and doesn't want it.

## 2020-07-25 NOTE — ED Notes (Signed)
PA in room to talk to patient and family

## 2020-07-25 NOTE — ED Notes (Signed)
Patient unrestrained to attempt bathroom use and while washing hands patient ran for the door, patient restrained again.
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
## 2020-07-25 NOTE — ED Notes (Signed)
Order for soft restraints and MD IVC patient. Mother asked to leave due to escalating the situation, May call mother at [phone] and sister at [phone]. Will use soft restraints bilateral ankles and wrist. Tried to talk to patient with interpreter prior to applying and patient ran back to restroom locked door.

## 2020-07-25 NOTE — ED Notes (Signed)
Patient presents to TCU ambulatory, mother with patient.

## 2020-07-26 ENCOUNTER — Other Ambulatory Visit: Payer: Self-pay

## 2020-07-26 LAB — RAPID URINE DRUG SCREEN, HOSP PERFORMED
Amphetamines: NOT DETECTED
Barbiturates: NOT DETECTED
Benzodiazepines: POSITIVE — AB
Cocaine: NOT DETECTED
Opiates: NOT DETECTED
Tetrahydrocannabinol: NOT DETECTED

## 2020-07-26 LAB — CMP14 + ANION GAP
ALT: 9 IU/L (ref 0–32)
AST: 12 IU/L (ref 0–40)
Albumin/Globulin Ratio: 2.3 — ABNORMAL HIGH (ref 1.2–2.2)
Albumin: 5.2 g/dL — ABNORMAL HIGH (ref 3.9–5.0)
Alkaline Phosphatase: 51 IU/L (ref 44–121)
Anion Gap: 19 mmol/L — ABNORMAL HIGH (ref 10.0–18.0)
BUN/Creatinine Ratio: 22 (ref 9–23)
BUN: 13 mg/dL (ref 6–20)
Bilirubin Total: 0.5 mg/dL (ref 0.0–1.2)
CO2: 21 mmol/L (ref 20–29)
Calcium: 10.3 mg/dL — ABNORMAL HIGH (ref 8.7–10.2)
Chloride: 101 mmol/L (ref 96–106)
Creatinine, Ser: 0.6 mg/dL (ref 0.57–1.00)
GFR calc Af Amer: 151 mL/min/{1.73_m2} (ref >59)
GFR calc non Af Amer: 131 mL/min/{1.73_m2} (ref >59)
Globulin, Total: 2.3 g/dL (ref 1.5–4.5)
Glucose: 110 mg/dL — ABNORMAL HIGH (ref 65–99)
Potassium: 3.9 mmol/L (ref 3.5–5.2)
Sodium: 141 mmol/L (ref 134–144)
Total Protein: 7.5 g/dL (ref 6.0–8.5)

## 2020-07-26 LAB — VALPROIC ACID LEVEL: Valproic Acid Lvl: <4 ug/mL — ABNORMAL LOW (ref 50–100)

## 2020-07-26 LAB — BETA HCG QUANT (REF LAB): hCG Quant: <1 m[IU]/mL

## 2020-07-26 MED ORDER — TRAZODONE HCL 50 MG PO TABS
50.0000 mg | ORAL_TABLET | Freq: Every evening | ORAL | Status: DC | PRN
  Administered 2020-07-27: 50 mg via ORAL
  Filled 2020-07-26 (×4): qty 1

## 2020-07-26 MED ORDER — OLANZAPINE 5 MG PO TBDP
5.0000 mg | ORAL_TABLET | Freq: Two times a day (BID) | ORAL | Status: DC
  Administered 2020-07-26 – 2020-07-27 (×2): 5 mg via ORAL
  Filled 2020-07-26 (×2): qty 1

## 2020-07-26 NOTE — BH Assessment (Incomplete)
Comprehensive Clinical Assessment (CCA) Note  07/26/2020 Sara Moon [id number]  Chief Complaint:  Patient is 20 year old female refugee from [place] seen at [facility] for bizarre behavior. Interpreter [name] [phone]) used to translate. Patient complained of having a baby inside of her, "I want the baby out of my body." The patient pregnancy test is negative. Patient stated throughout the assessment she wanted the baby out of her body. Patient denied wanting to herself, denied wanting to hurt other people, denied seeing things and denied hearing things. Patient presented with bizarre and she rocked back and forth during the assessment and starred with a glaze. Due to language barrier and cultural difference additional information taking from chart review.          Chief Complaint  Patient presents with  . Psychiatric Evaluation   Visit Diagnosis: ***    CCA Screening, Triage and Referral (STR)  Patient Reported Information How did you hear about [place]? No data recorded Referral name: No data recorded Referral phone number: No data recorded  Whom do you see for routine medical problems? No data recorded Practice/Facility Name: No data recorded Practice/Facility Phone Number: No data recorded Name of Contact: No data recorded Contact Number: No data recorded Contact Fax Number: No data recorded Prescriber Name: No data recorded Prescriber Address (if known): No data recorded  What Is the Reason for Your Visit/Call Today? No data recorded How Long Has This Been Causing You Problems? No data recorded What Do You Feel Would Help You the Most Today? No data recorded  Have You Recently Been in Any Inpatient Treatment (Hospital/Detox/Crisis Center/28-Day Program)? No data recorded Name/Location of Program/Hospital:No data recorded How Long Were You There? No data recorded When Were You Discharged? No data recorded  Have You Ever Received Services From [facility] Before? No data  recorded Who Do You See at [facility]? No data recorded  Have You Recently Had Any Thoughts About Hurting Yourself? No data recorded Are You Planning to Commit Suicide/Harm Yourself At This time? No data recorded  Have you Recently Had Thoughts About Hurting Someone [name]? No data recorded Explanation: No data recorded  Have You Used Any Alcohol or Drugs in the Past 24 Hours? No data recorded How Long Ago Did You Use Drugs or Alcohol? No data recorded What Did You Use and How Much? No data recorded  Do You Currently Have a Therapist/Psychiatrist? No data recorded Name of Therapist/Psychiatrist: No data recorded  Have You Been Recently Discharged From Any Office Practice or Programs? No data recorded Explanation of Discharge From Practice/Program: No data recorded    CCA Screening Triage Referral Assessment Type of Contact: No data recorded Is this Initial or Reassessment? No data recorded Date Telepsych consult ordered in CHL:  No data recorded Time Telepsych consult ordered in CHL:  No data recorded  Patient Reported Information Reviewed? No data recorded Patient Left Without Being Seen? No data recorded Reason for Not Completing Assessment: No data recorded  Collateral Involvement: No data recorded  Does Patient Have a Court Appointed Legal Guardian? No data recorded Name and Contact of Legal Guardian: No data recorded If Minor and Not Living with Parent(s), Who has Custody? No data recorded Is CPS involved or ever been involved? No data recorded Is APS involved or ever been involved? No data recorded  Patient Determined To Be At Risk for Harm To Self or Others Based on Review of Patient Reported Information or Presenting Complaint? No data recorded Method: No data recorded Availability of  Means: No data recorded Intent: No data recorded Notification Required: No data recorded Additional Information for Danger to Others Potential: No data recorded Additional Comments for  Danger to Others Potential: No data recorded Are There Guns or Other Weapons in Your Home? No data recorded Types of Guns/Weapons: No data recorded Are These Weapons Safely Secured?                            No data recorded Who Could Verify You Are Able To Have These Secured: No data recorded Do You Have any Outstanding Charges, Pending Court Dates, Parole/Probation? No data recorded Contacted To Inform of Risk of Harm To Self or Others: No data recorded  Location of Assessment: No data recorded  Does Patient Present under Involuntary Commitment? No data recorded IVC Papers Initial File Date: No data recorded  [place] of Residence: No data recorded  Patient Currently Receiving the Following Services: No data recorded  Determination of Need: No data recorded  Options For Referral: No data recorded    CCA Biopsychosocial Intake/Chief Complaint:  No data recorded Current Symptoms/Problems: No data recorded  Patient Reported Schizophrenia/Schizoaffective Diagnosis in Past: No data recorded  Strengths: "I like shopping" "i like to clean"  Preferences: No data recorded Abilities: No data recorded  Type of Services Patient Feels are Needed: No data recorded  Initial Clinical Notes/Concerns: No data recorded  Mental Health Symptoms Depression:  -- ([place] refugee dealing with trauma and depression)   Duration of Depressive symptoms: No data recorded  Mania:  N/A   Anxiety:   N/A   Psychosis:  Delusions   Duration of Psychotic symptoms: -- (patient expressed psychotic break)   Trauma:  -- (patient refugee from [place] experienced trauma)   Obsessions:  N/A   Compulsions:  N/A   Inattention:  N/A   Hyperactivity/Impulsivity:  No data recorded  Oppositional/Defiant Behaviors:  No data recorded  Emotional Irregularity:  No data recorded  Other Mood/Personality Symptoms:  No data recorded   Mental Status Exam Appearance and self-care  Stature:  Small    Weight:  Thin   Clothing:  No data recorded  Grooming:  Bizarre   Cosmetic use:  No data recorded  Posture/gait:  Bizarre   Motor activity:  No data recorded  Sensorium  Attention:  Confused   Concentration:  -- (Bizarre behavior)   Orientation:  Place   Recall/memory:  No data recorded  Affect and Mood  Affect:  Labile   Mood:  No data recorded  Relating  Eye contact:  Avoided   Facial expression:  No data recorded  Attitude toward examiner:  No data recorded  Thought and Language  Speech flow: No data recorded  Thought content:  No data recorded  Preoccupation:  No data recorded  Hallucinations:  No data recorded  Organization:  No data recorded  [employer] of Knowledge:  -- (UTA)   Intelligence:  -- (UTA)   Abstraction:  No data recorded  Judgement:  No data recorded  Reality Testing:  No data recorded  Insight:  No data recorded  Decision Making:  No data recorded  Social Functioning  Social Maturity:  No data recorded  Social Judgement:  No data recorded  Stress  Stressors:  No data recorded  Coping Ability:  No data recorded  Skill Deficits:  No data recorded  Supports:  No data recorded    Religion:    Leisure/Recreation:  Exercise/Diet:     CCA Employment/Education Employment/Work Situation: Employment / Work Psychologist, occupational Employment situation:  Industrial/product designer)  Education:     CCA Family/Childhood History Family and Relationship History:    Childhood History:     Child/Adolescent Assessment:     CCA Substance Use Alcohol/Drug Use:                           ASAM's:  Six Dimensions of Multidimensional Assessment  Dimension 1:  Acute Intoxication and/or Withdrawal Potential:      Dimension 2:  Biomedical Conditions and Complications:      Dimension 3:  Emotional, Behavioral, or Cognitive Conditions and Complications:     Dimension 4:  Readiness to Change:     Dimension 5:  Relapse, Continued use, or  Continued Problem Potential:     Dimension 6:  Recovery/Living Environment:     ASAM Severity Score:    ASAM Recommended Level of Treatment:     Substance use Disorder (SUD)    Recommendations for Services/Supports/Treatments:    DSM5 Diagnoses: Patient Active Problem List   Diagnosis Date Noted  . Psychosis (HCC) 07/25/2020  . Seizure disorder (HCC) 06/23/2020  . Healthcare maintenance 06/23/2020  . Depression 06/23/2020    Patient Centered Plan: Patient is on the following Treatment Plan(s):  {CH[facility] AMB BH OP Treatment Plans:21091129}   Referrals to Alternative Service(s): Referred to Alternative Service(s):   Place:   Date:   Time:    Referred to Alternative Service(s):   Place:   Date:   Time:    Referred to Alternative Service(s):   Place:   Date:   Time:    Referred to Alternative Service(s):   Place:   Date:   Time:     Corliss Coggeshall, LCAS

## 2020-07-26 NOTE — Consult Note (Signed)
  Spoke with patient's RN who reports concern for patient's escalating behavior.  Attending nurse reports increasing aggressive behavior throughout this shift. Patient reviewed with Dr. Bronwen Betters. EKG order initiated.  Medication: -Zyprexa Zydis 5 mg twice daily

## 2020-07-26 NOTE — ED Notes (Signed)
Patient speaks the language Dari wall-e pad at door side.

## 2020-07-26 NOTE — ED Notes (Signed)
Patient ate a sandwich.

## 2020-07-26 NOTE — ED Notes (Signed)
Nurse having a difficulty with communication. The wall-e is hard to get a Dari interrupter. They are not understanding what the patient is saying. " she wants to take a knife and make the pain go away" Th interrupter states she saying a bunch of random things. Nurse talked with sponsor and mother will come to the hospital around 9.   * call sister she speaks good english  [phone]  Mom: [phone]  Brother:[phone]  Sponsor: [name] : [phone]

## 2020-07-26 NOTE — BH Assessment (Signed)
TTS per note review attempted to see patient on 07/25/20 at 2028 and patient was sleeping (sedated) after having a MRI completed. TTS was later informed at 0641 this date by RN who was sitting up WALL-E for assessment reporting at that time the translator was having problems communicating with patient using WALL-E. Per notes, patient speaks a dialect of Dari that the interrupter is having difficultly understanding. This writer attempted to assess patient at 1030 hours this date and was informed that in person interpretor that was located through [facility] was available only by appointment. This writer contact [name] [phone] at that time who reported they were not available until 1500 hours this date. Beth RN contacted this Clinical research associate once translator arrived to inquire if TTS was available at that time to assess patient. This Clinical research associate contacted all TTS staff to inquire if they were available for assessment. All staff were busy at [facility] with walk ins and informed RN. TTS will attempt to coordinate later this date.

## 2020-07-26 NOTE — ED Notes (Signed)
Pt having manic behaviors. Labile with emotions. Episode of banging head on bed, pt redirected and reassured.  Medication compliant.

## 2020-07-26 NOTE — ED Notes (Signed)
Patient woke up confused, staff used wall-e pad to orient patient to whereshe is at and where her family is at. Patient was cooperative and used the bathroom, brushed her teeth, and laid back down. Patient refused to eat but she did drink water.

## 2020-07-26 NOTE — BH Assessment (Signed)
Comprehensive Clinical Assessment (CCA) Note  07/26/2020 Sara Moon [id number]  Chief Complaint:  Patient is 20 year old female refugee from [place] seen at [facility] for[facility] psychosis. Interpreter [name] [phone]) used to translate. Patient complained of having a baby inside of her, "I want the baby out of my body." The patient pregnancy test is negative. Patient stated throughout the assessment she wanted the baby out of her body. Patient denied wanting to herself, denied wanting to hurt other people, denied seeing things and denied hearing things. Patient presented with bizarre and she rocked back and forth during the assessment and starred with a glaze. Due to language barrier and cultural difference additional information taking from chart review.   07/23/2020 patient seen by [name] RN [facility] complaining of her heart beating fast and out of her chest. The patient has a history of a potential seizure disorder. She was recently switched from Levetiracetam to Depakote on 07/04/2020. Last seizure was 1 month ago. According to the patient's mother she stopped the Depakote and has not taken it today because she felt like her heart was pounding. The patient had 3 episodes of arm shaking and then falling yesterday. Afterwards, the patient for 10 minutes did not remember where she was and did not recognize her mother.   07/24/2020 Pt.'s sponsor [name] is on DPR. She states that pt is having serious problems, she is having memory issues. Pt. is not eating, she states that pt. has stopped medication without consulting doctor first.   [name], MD - referred patient stay of depakote. Follow up with PCP and may need to get lab testing. -[facility]P  Pt having manic behaviors. Labile with emotions. Episode of banging head on bed, pt redirected and reassured.  Medication compliant.    Disposition:    Chief Complaint  Patient presents with  . Psychiatric Evaluation    Visit Diagnosis:    CCA Screening, Triage and Referral (STR)  Patient Reported Information How did you hear about [place]? No data recorded Referral name: No data recorded Referral phone number: No data recorded  Whom do you see for routine medical problems? No data recorded Practice/Facility Name: No data recorded Practice/Facility Phone Number: No data recorded Name of Contact: No data recorded Contact Number: No data recorded Contact Fax Number: No data recorded Prescriber Name: No data recorded Prescriber Address (if known): No data recorded  What Is the Reason for Your Visit/Call Today? No data recorded How Long Has This Been Causing You Problems? No data recorded What Do You Feel Would Help You the Most Today? No data recorded  Have You Recently Been in Any Inpatient Treatment (Hospital/Detox/Crisis Center/28-Day Program)? No data recorded Name/Location of Program/Hospital:No data recorded How Long Were You There? No data recorded When Were You Discharged? No data recorded  Have You Ever Received Services From [facility] Before? No data recorded Who Do You See at [facility]? No data recorded  Have You Recently Had Any Thoughts About Hurting Yourself? No data recorded Are You Planning to Commit Suicide/Harm Yourself At This time? No data recorded  Have you Recently Had Thoughts About Hurting Someone [name]? No data recorded Explanation: No data recorded  Have You Used Any Alcohol or Drugs in the Past 24 Hours? No data recorded How Long Ago Did You Use Drugs or Alcohol? No data recorded What Did You Use and How Much? No data recorded  Do You Currently Have a Therapist/Psychiatrist? No data recorded Name of Therapist/Psychiatrist: No data  recorded  Have You Been Recently Discharged From Any Office Practice or Programs? No data recorded Explanation of Discharge From Practice/Program: No data recorded    CCA Screening Triage Referral Assessment Type of Contact: No  data recorded Is this Initial or Reassessment? No data recorded Date Telepsych consult ordered in CHL:  No data recorded Time Telepsych consult ordered in CHL:  No data recorded  Patient Reported Information Reviewed? No data recorded Patient Left Without Being Seen? No data recorded Reason for Not Completing Assessment: No data recorded  Collateral Involvement: No data recorded  Does Patient Have a Court Appointed Legal Guardian? No data recorded Name and Contact of Legal Guardian: No data recorded If Minor and Not Living with Parent(s), Who has Custody? No data recorded Is CPS involved or ever been involved? No data recorded Is APS involved or ever been involved? No data recorded  Patient Determined To Be At Risk for Harm To Self or Others Based on Review of Patient Reported Information or Presenting Complaint? No data recorded Method: No data recorded Availability of Means: No data recorded Intent: No data recorded Notification Required: No data recorded Additional Information for Danger to Others Potential: No data recorded Additional Comments for Danger to Others Potential: No data recorded Are There Guns or Other Weapons in Your Home? No data recorded Types of Guns/Weapons: No data recorded Are These Weapons Safely Secured?                            No data recorded Who Could Verify You Are Able To Have These Secured: No data recorded Do You Have any Outstanding Charges, Pending Court Dates, Parole/Probation? No data recorded Contacted To Inform of Risk of Harm To Self or Others: No data recorded  Location of Assessment: No data recorded  Does Patient Present under Involuntary Commitment? No data recorded IVC Papers Initial File Date: No data recorded  [place] of Residence: No data recorded  Patient Currently Receiving the Following Services: No data recorded  Determination of Need: No data recorded  Options For Referral: No data recorded    CCA  Biopsychosocial Intake/Chief Complaint:  No data recorded Current Symptoms/Problems: No data recorded  Patient Reported Schizophrenia/Schizoaffective Diagnosis in Past: No data recorded  Strengths: "I like shopping" "i like to clean"  Preferences: No data recorded Abilities: No data recorded  Type of Services Patient Feels are Needed: No data recorded  Initial Clinical Notes/Concerns: No data recorded  Mental Health Symptoms Depression:  -- ([place] refugee dealing with trauma and depression)   Duration of Depressive symptoms: No data recorded  Mania:  N/A   Anxiety:   N/A   Psychosis:  Delusions   Duration of Psychotic symptoms: -- (patient expressed psychotic break)   Trauma:  -- (patient refugee from [place] experienced trauma)   Obsessions:  N/A   Compulsions:  N/A   Inattention:  N/A   Hyperactivity/Impulsivity:  No data recorded  Oppositional/Defiant Behaviors:  No data recorded  Emotional Irregularity:  No data recorded  Other Mood/Personality Symptoms:  No data recorded   Mental Status Exam Appearance and self-care  Stature:  Small   Weight:  Thin   Clothing:  No data recorded  Grooming:  Bizarre   Cosmetic use:  No data recorded  Posture/gait:  Bizarre   Motor activity:  No data recorded  Sensorium  Attention:  Confused   Concentration:  -- (Bizarre behavior)   Orientation:  Place  Recall/memory:  No data recorded  Affect and Mood  Affect:  Labile   Mood:  No data recorded  Relating  Eye contact:  Avoided   Facial expression:  No data recorded  Attitude toward examiner:  No data recorded  Thought and Language  Speech flow: No data recorded  Thought content:  No data recorded  Preoccupation:  No data recorded  Hallucinations:  No data recorded  Organization:  No data recorded  [employer] of Knowledge:  -- (UTA)   Intelligence:  -- (UTA)   Abstraction:  No data recorded  Judgement:  No data recorded   Reality Testing:  No data recorded  Insight:  No data recorded  Decision Making:  No data recorded  Social Functioning  Social Maturity:  No data recorded  Social Judgement:  No data recorded  Stress  Stressors:  No data recorded  Coping Ability:  No data recorded  Skill Deficits:  No data recorded  Supports:  No data recorded    Religion:    Leisure/Recreation:    Exercise/Diet:     CCA Employment/Education Employment/Work Situation: Employment / Work Psychologist, occupational Employment situation:  Industrial/product designer)  Education:     CCA Family/Childhood History Family and Relationship History:    Childhood History:     Child/Adolescent Assessment:     CCA Substance Use Alcohol/Drug Use:                           ASAM's:  Six Dimensions of Multidimensional Assessment  Dimension 1:  Acute Intoxication and/or Withdrawal Potential:      Dimension 2:  Biomedical Conditions and Complications:      Dimension 3:  Emotional, Behavioral, or Cognitive Conditions and Complications:     Dimension 4:  Readiness to Change:     Dimension 5:  Relapse, Continued use, or Continued Problem Potential:     Dimension 6:  Recovery/Living Environment:     ASAM Severity Score:    ASAM Recommended Level of Treatment:     Substance use Disorder (SUD)    Recommendations for Services/Supports/Treatments:    DSM5 Diagnoses: Patient Active Problem List   Diagnosis Date Noted  . Psychosis (HCC) 07/25/2020  . Seizure disorder (HCC) 06/23/2020  . Healthcare maintenance 06/23/2020  . Depression 06/23/2020    Patient Centered Plan: Patient is on the following Treatment Plan(s):  Depression   Referrals to Alternative Service(s): Referred to Alternative Service(s):   Place:   Date:   Time:    Referred to Alternative Service(s):   Place:   Date:   Time:    Referred to Alternative Service(s):   Place:   Date:   Time:    Referred to Alternative Service(s):   Place:   Date:   Time:      Skie Vitrano, LCAS

## 2020-07-26 NOTE — Progress Notes (Signed)
Internal Medicine Clinic Attending  Case discussed with Dr. Masoudi  At the time of the visit.  We reviewed the resident's history and exam and pertinent patient test results.  I agree with the assessment, diagnosis, and plan of care documented in the resident's note.  

## 2020-07-26 NOTE — BH Assessment (Signed)
Clinician attempted to completed patient's TTS assessment. However, patient will need interpretation services. Contacted nursing [employer],  RN) to inquire if interpreter services were present. She stated that the interpreter was not present, but could be called to make arrangements for services needed.   Clinician contacted the interpreter services directly. The interpreter is [facility]) [phone] states that it would take her approximately 30 minutes to arrive [facility] for interpretation services. Clinician discussed with [name] if "over the phone" options for interpretation  in order to expedite this clinician completing the TTS assessment. Per [name], based on her ealier interaction with patient she feels that "face to face" interpretation would better for patient.  Clinician will share the above information with next shift in order to complete patient's TTS assessment.
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
## 2020-07-26 NOTE — ED Provider Notes (Signed)
Emergency Medicine Observation Re-evaluation Note  Sara Moon is a 20 y.o. female, seen on rounds today.  Pt initially presented to the ED for complaints of Psychiatric Evaluation Currently, the patient is sitting up in bed looking frightened.  Physical Exam  BP 107/70   Pulse 86   Temp 98.3 F (36.8 C) (Oral)   Resp 18   SpO2 100%  Physical Exam General: No acute distress Cardiac: Well-perfused Lungs: Nonlabored Psych: Paranoid appearing  ED Course / MDM  EKG:  Clinical Course as of 07/26/20 0733  Thu Jul 25, 2020  1738 Lab work is generally reassuring.  Patient not taking any oral medications or following commands at this time.  Will IVC patient.  We will give a dose of Ativan as well as Geodon.  Will obtain an MRI of the brain. [LJ]    Clinical Course User Index [LJ] Placido Sou, PA-C   I have reviewed the labs performed to date as well as medications administered while in observation.  Recent changes in the last 24 hours include TTS attempted to evaluate patient.  They will try again today.  Mother is also supposed to be coming up today.  Apparently there is difficulty with communicating given via the iPad interpreter as the dialect is not covered..  Plan  Current plan is for TTS evaluation. Patient is under full IVC at this time.   Terrilee Files, MD 07/27/20 [phone]

## 2020-07-27 MED ORDER — LORAZEPAM 2 MG/ML IJ SOLN
1.0000 mg | Freq: Once | INTRAMUSCULAR | Status: AC
  Administered 2020-07-27: 1 mg via INTRAMUSCULAR
  Filled 2020-07-27: qty 1

## 2020-07-27 MED ORDER — HALOPERIDOL LACTATE 5 MG/ML IJ SOLN
5.0000 mg | Freq: Once | INTRAMUSCULAR | Status: AC
  Administered 2020-07-27: 5 mg via INTRAMUSCULAR
  Filled 2020-07-27: qty 1

## 2020-07-27 MED ORDER — OLANZAPINE 5 MG PO TBDP
5.0000 mg | ORAL_TABLET | Freq: Two times a day (BID) | ORAL | Status: DC
Start: 2020-07-27 — End: 2020-07-31
  Administered 2020-07-28 – 2020-07-31 (×7): 5 mg via ORAL
  Filled 2020-07-27 (×7): qty 1

## 2020-07-27 MED ORDER — DIPHENHYDRAMINE HCL 50 MG/ML IJ SOLN
50.0000 mg | Freq: Once | INTRAMUSCULAR | Status: AC
  Administered 2020-07-27: 50 mg via INTRAMUSCULAR
  Filled 2020-07-27: qty 1

## 2020-07-27 NOTE — Progress Notes (Signed)
Per Melbourne Abts, PA-C, patient meets criteria for inpatient treatment. There are no available beds at [facility] today. CSW faxed referrals to the following facilities for review:  [facility]  TTS will continue to seek bed placement.   Trula Slade, MSW, LCSW Clinical Social Worker 07/27/2020 12:08 PM
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
## 2020-07-27 NOTE — ED Provider Notes (Signed)
Emergency Medicine Observation Re-evaluation Note  Sara Moon is a 20 y.o. female, seen on rounds today.  Pt initially presented to the ED for complaints of Psychiatric Evaluation Currently, the patient is psychotic with IVC. Level 5 caveat patient speaks Farsi Physical Exam  BP (!) 93/58 (BP Location: Right Arm)   Pulse 70   Temp 97.8 F (36.6 C) (Axillary)   Resp 18   SpO2 95%  Physical Exam General: wdwn sitting up in bed  Cardiac: rrr  Psych:  Swaying head back and forth in bed  ED Course / MDM  EKG:EKG Interpretation  Date/Time:  Friday July 26 2020 16:36:43 EST Ventricular Rate:  106 PR Interval:  134 QRS Duration: 78 QT Interval:  320 QTC Calculation: 425 R Axis:   59 Text Interpretation: Sinus tachycardia no acute ST/T changes Confirmed by Pricilla Loveless [phone]) on 07/26/2020 4:49:13 PM  Clinical Course as of 07/27/20 0934  Thu Jul 25, 2020  1738 Lab work is generally reassuring.  Patient not taking any oral medications or following commands at this time.  Will IVC patient.  We will give a dose of Ativan as well as Geodon.  Will obtain an MRI of the brain. [LJ]    Clinical Course User Index [LJ] Placido Sou, PA-C   I have reviewed the labs performed to date as well as medications administered while in observation.  Recent changes in the last 24 hours include none.  Plan  Current plan is for inpatient placement. Patient is under full IVC at this time. Expire 08/01/20   Margarita Grizzle, MD 07/27/20 [phone]
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
## 2020-07-28 MED ORDER — HYDROXYZINE HCL 25 MG PO TABS: 25.0000 mg | ORAL_TABLET | Freq: Three times a day (TID) | ORAL | Status: DC | PRN

## 2020-07-28 MED ORDER — LIP MEDEX EX OINT
TOPICAL_OINTMENT | CUTANEOUS | Status: AC
  Administered 2020-07-28: 1
  Filled 2020-07-28: qty 7

## 2020-07-28 NOTE — ED Notes (Signed)
Messaging [facility] for an update on estimated time of TTS.

## 2020-07-28 NOTE — ED Notes (Signed)
TTS consult in process. 

## 2020-07-28 NOTE — ED Provider Notes (Signed)
Emergency Medicine Observation Re-evaluation Note  Sara Moon is a 20 y.o. female, seen on rounds today.  Pt initially presented to the ED for complaints of Psychiatric Evaluation Currently, the patient is awaiting placement.  Physical Exam  BP 106/64 (BP Location: Right Arm)   Pulse 66   Temp 98.3 F (36.8 C) (Oral)   Resp 18   SpO2 96%  Physical Exam General: Calm and cooperative  ED Course / MDM  EKG:EKG Interpretation  Date/Time:  Friday July 26 2020 16:36:43 EST Ventricular Rate:  106 PR Interval:  134 QRS Duration: 78 QT Interval:  320 QTC Calculation: 425 R Axis:   59 Text Interpretation: Sinus tachycardia no acute ST/T changes Confirmed by Pricilla Loveless [phone]) on 07/26/2020 4:49:13 PM  Clinical Course as of 07/28/20 0925  Thu Jul 25, 2020  1738 Lab work is generally reassuring.  Patient not taking any oral medications or following commands at this time.  Will IVC patient.  We will give a dose of Ativan as well as Geodon.  Will obtain an MRI of the brain. [LJ]    Clinical Course User Index [LJ] Placido Sou, PA-C   I have reviewed the labs performed to date as well as medications administered while in observation.  Recent changes in the last 24 hours include patient now with increased cooperation..  Plan  Current plan is for placement. Patient is under full IVC at this time.   Lorre Nick, MD 07/28/20 [phone]

## 2020-07-28 NOTE — ED Notes (Addendum)
Interpreter, Mahin Emmit Alexanders [phone], at bedside to interpret for TTS consult. She said the pt's family is upset because they do not understand what is going on with the pt. Said a doctor has not seen the pt and no one has explained what is going on with the pt. Messaged ED provider covering [facility] today. Messaging [facility] to let them know the interpreter is at bedside.

## 2020-07-28 NOTE — ED Notes (Signed)
EDP spoke with pt, pt mother, and interpreter. The interpreter thanked EDP for explaining results of medical testing and MRI. She said the family has a better understanding of pt's condition. They are satisfied with explanation EDP provided. And they would like her to be placed on a psychiatric inpatient unit asap. The interpreter, mother, and I created a paper with [place] and Farsi requests for blankets, food, and water for the pt to show [place] when she needs these things. Pt mother and interpreter departed.

## 2020-07-28 NOTE — BH Assessment (Signed)
Reassessment Note:  Pt's mother was present and interpreter services by Blanca Friend. Pt sitting upright in bed with her head in constant motion swinging side to side. Pt states she is doing well and much better. She denied SI, HI and AVH. Pt stated she is not pregnant anymore. She said she remembers she had earlier thought that was true. She stated a long time ago when she was a kid she had these thoughts before "but I don't remember it". Then pt stated the MD told her she was pregnant. When asked questions for clarification, pt answered she knows she is pregnant because she is feeling pregnant. She states she wants the MD to take the baby out.   Pt's mother reported concerns about pt's care in ED. Through interpreter she said she was considering calling the complaint line. Mother states she has not spoken with an MD about pt's MRI. She reports pt had an MRI in [place] that showed a cyst and she would like to know if there were any changes. Mother said "it's out of the question, pt cannot go to a hospital in [facility]- it's too far away and they don't have cars. She said getting to [facility] is even a burden for them.   Pt continues to meet criteria for inpt psychiatric tx

## 2020-07-28 NOTE — ED Notes (Signed)
Messaged and spoke with Ophelia Shoulder. Let them know interpreter's schedule and that the interpreter wants to be here for pt psych assessment today. Asking when a TTS consult will be scheduled. They will get back to me.

## 2020-07-28 NOTE — ED Notes (Signed)
Pt mother at bedside

## 2020-07-28 NOTE — ED Notes (Addendum)
Pt interpreter, Mahin Emmit Alexanders [phone], called for an update on pt and wants to know when her assessment is scheduled today. Said she is available now through 11:45 am, 1 pm-3:30 pm, and after 7:30 pm. Told her I would try to find out when her [facility] assessment is scheduled. Explained pt is medically cleared, and we are waiting on psychiatric inpatient placement.

## 2020-07-28 NOTE — ED Notes (Signed)
Messaging with [facility] for an update on TTS assessment. Let them know times the interpreter can be here to interpret for pt.

## 2020-07-28 NOTE — BH Assessment (Addendum)
Per am bed meeting, Dr. Jola Babinski recommended reaching out to [facility] in [place], [place]. He reported that patient's need for interpretation services would be accomodated at the facility. Clinician called [facility] and spoke to intake staff Josiah Lobo). She stated that they have not bed availability for today nor a wait list. She recommended that [facility] Disposition staff call back daily to inquire about bed availability.   Per Ophelia Shoulder, RN, patient meets criteria for inpatient treatment. There are no available beds at [facility] today. LCSW/Disposition Counselor faxed referrals to the following facilities for review: There are no appropriate beds at [facility] today. LCSW/Disposition Counselor re-faxed referrals to the following facilities for review:  [facility] [place] [facility] [place] [facility]en  LCSW/Disposition Counselor will continue to seek bed placement.

## 2020-07-29 NOTE — ED Provider Notes (Signed)
Emergency Medicine Observation Re-evaluation Note  Sara Moon is a 20 y.o. female, seen on rounds today.  Pt initially presented to the ED for complaints of Psychiatric Evaluation Currently, the patient is resting.  Physical Exam  BP (!) 103/57 (BP Location: Left Arm)   Pulse 65   Temp 97.7 F (36.5 C) (Oral)   Resp 16   SpO2 97%  Physical Exam General: in bed, answers to name Skin: dry Lungs: no respiratory distress, unlabored breathing Psych: currently calm and resting  ED Course / MDM  EKG:EKG Interpretation  Date/Time:  Friday July 26 2020 16:36:43 EST Ventricular Rate:  106 PR Interval:  134 QRS Duration: 78 QT Interval:  320 QTC Calculation: 425 R Axis:   59 Text Interpretation: Sinus tachycardia no acute ST/T changes Confirmed by Pricilla Loveless [phone]) on 07/26/2020 4:49:13 PM  Clinical Course as of 07/29/20 0849  Thu Jul 25, 2020  1738 Lab work is generally reassuring.  Patient not taking any oral medications or following commands at this time.  Will IVC patient.  We will give a dose of Ativan as well as Geodon.  Will obtain an MRI of the brain. [LJ]    Clinical Course User Index [LJ] Placido Sou, PA-C   I have reviewed the labs performed to date as well as medications administered while in observation.  Recent changes in the last 24 hours include awaiting placement.  Plan  Current plan is for inpt psychiatric care and treatment.   Rozelle Logan, [place] 07/29/20 [phone]

## 2020-07-29 NOTE — ED Notes (Signed)
Pt alert. Pt ate breakfast.  Pt took medication. Pt calm. Pt moves head side to side continuously.

## 2020-07-29 NOTE — BH Assessment (Signed)
BHH Assessment Progress Note  Per Shuvon Rankin, NP, this pt continues to require psychiatric hospitalization at this time.  Pt presents under IVC initiated by EDP Lorre Nick, MD.  The following facilities have been contacted to seek placement for this pt, with results as noted:  Beds available, information sent, decision pending: [facility]  At capacity: [facility]    Doylene Canning, [place] Behavioral Health Coordinator [phone]

## 2020-07-30 ENCOUNTER — Encounter (HOSPITAL_COMMUNITY): Payer: Self-pay | Admitting: Registered Nurse

## 2020-07-30 NOTE — Progress Notes (Signed)
Mom's # [phone]  Mom wants to be called when transported to facility.

## 2020-07-30 NOTE — Progress Notes (Signed)
Translator [phone]

## 2020-07-30 NOTE — Consult Note (Signed)
  [facility] Consult Tele psych Reassessment Patient location:  [facility] ED Provider location:  [facility]   [place], 20 y.o., Yr. old female patient at [facility] ED reassessed via tele health by this provider.  Chart reviewed and discussed with Dr. Nelly Rout on 07/30/20.  Interpreter used to assist with communication.  On evaluation Jeannine Virrueta continues to report she is pregnant.  Patient upset because no one believes what she is saying.  Patient reports she has been sleeping and eating without any difficulty.  States she has been taking her medications without any adverse reaction.  Patient denies suicidal/self-harm/homicidal ideations, psychosis, paranoia.  Patient asked if she knew why she was in the hospital and she responded yes " because of the baby."  During evaluation Carlise Curiale is alert/oriented x 3; calm/cooperative but mood is irritable related to the subject of no one believing she is pregnant and keeping her in the hospital.  Patient continues to have the delusional thinking that she is pregnant.  But she does not appear to be responding to auditory/visual hallucinations.  Patient is sitting up in bed and she shaking her head from left to right.  When asked why she was shaking her head patient stated that she does not sometimes when she is sitting and reports that it helps her to relax.  Patient was able to stop shaking her head when she wanted to demonstrate that she did not have to do it.    Recommendations: Continue seeking inpatient psychiatric treatment  Disposition:   Recommend psychiatric Inpatient admission when medically cleared.    Assunta Found, NP

## 2020-07-30 NOTE — ED Provider Notes (Signed)
Emergency Medicine Observation Re-evaluation Note  Sara Moon is a 20 y.o. female, seen on rounds today.  Pt initially presented to the ED for complaints of Psychiatric Evaluation Currently, the patient is eating breakfast with mother at her side.  Physical Exam  BP (!) 92/50 (BP Location: Right Arm)   Pulse (!) 58   Temp 97.8 F (36.6 C) (Oral)   Resp 14   SpO2 97%  Physical Exam General: Calm Cardiac: Heart rate slightly low  lungs: Normal respiratory rate Psych: Cooperative  ED Course / MDM  EKG:EKG Interpretation  Date/Time:  Friday July 26 2020 16:36:43 EST Ventricular Rate:  106 PR Interval:  134 QRS Duration: 78 QT Interval:  320 QTC Calculation: 425 R Axis:   59 Text Interpretation: Sinus tachycardia no acute ST/T changes Confirmed by Pricilla Loveless [phone]) on 07/26/2020 4:49:13 PM  Clinical Course as of 07/30/20 0957  Thu Jul 25, 2020  1738 Lab work is generally reassuring.  Patient not taking any oral medications or following commands at this time.  Will IVC patient.  We will give a dose of Ativan as well as Geodon.  Will obtain an MRI of the brain. [LJ]    Clinical Course User Index [LJ] Placido Sou, PA-C   I have reviewed the labs performed to date as well as medications administered while in observation.  Recent changes in the last 24 hours include being assessed by TTS.  Plan  Current plan is for psychiatric placement. Patient is under full IVC at this time.   Mancel Bale, MD 07/30/20 [phone]

## 2020-07-30 NOTE — BH Assessment (Signed)
BHH Assessment Progress Note  Per Shuvon Rankin, NP this involuntary pt continues to require psychiatric hospitalization at this time.  Pt is under consideration for admission to [facility].  Final decision is pending as of this writing.  Doylene Canning, [place] Behavioral Health Coordinator [phone]

## 2020-07-31 ENCOUNTER — Telehealth: Payer: Self-pay | Admitting: Pediatric Intensive Care

## 2020-07-31 ENCOUNTER — Encounter (HOSPITAL_COMMUNITY): Payer: Self-pay | Admitting: Psychiatry

## 2020-07-31 ENCOUNTER — Other Ambulatory Visit: Payer: Self-pay

## 2020-07-31 ENCOUNTER — Other Ambulatory Visit: Payer: Self-pay | Admitting: Registered Nurse

## 2020-07-31 ENCOUNTER — Inpatient Hospital Stay (HOSPITAL_COMMUNITY)
Admission: AD | Admit: 2020-07-31 | Discharge: 2020-08-03 | DRG: 885 | Disposition: A | Discharge status: Other Acute Inpatient Hospital | Payer: Medicaid Other | Source: Intra-hospital | Attending: Emergency Medicine | Admitting: Emergency Medicine

## 2020-07-31 DIAGNOSIS — E538 Deficiency of other specified B group vitamins: Secondary | ICD-10-CM | POA: Diagnosis not present

## 2020-07-31 DIAGNOSIS — Z046 Encounter for general psychiatric examination, requested by authority: Secondary | ICD-10-CM | POA: Diagnosis not present

## 2020-07-31 DIAGNOSIS — G629 Polyneuropathy, unspecified: Secondary | ICD-10-CM | POA: Diagnosis present

## 2020-07-31 DIAGNOSIS — G929 Unspecified toxic encephalopathy: Secondary | ICD-10-CM | POA: Diagnosis present

## 2020-07-31 DIAGNOSIS — G40909 Epilepsy, unspecified, not intractable, without status epilepticus: Secondary | ICD-10-CM | POA: Diagnosis present

## 2020-07-31 DIAGNOSIS — F23 Brief psychotic disorder: Secondary | ICD-10-CM | POA: Diagnosis present

## 2020-07-31 DIAGNOSIS — Z79899 Other long term (current) drug therapy: Secondary | ICD-10-CM

## 2020-07-31 DIAGNOSIS — F29 Unspecified psychosis not due to a substance or known physiological condition: Secondary | ICD-10-CM | POA: Diagnosis not present

## 2020-07-31 DIAGNOSIS — G930 Cerebral cysts: Secondary | ICD-10-CM | POA: Diagnosis present

## 2020-07-31 DIAGNOSIS — R32 Unspecified urinary incontinence: Secondary | ICD-10-CM | POA: Diagnosis present

## 2020-07-31 DIAGNOSIS — F22 Delusional disorders: Secondary | ICD-10-CM | POA: Diagnosis not present

## 2020-07-31 LAB — RESP PANEL BY RT-PCR (FLU A&B, COVID) ARPGX2
Influenza A by PCR: NEGATIVE
Influenza B by PCR: NEGATIVE
SARS Coronavirus 2 by RT PCR: NEGATIVE

## 2020-07-31 MED ORDER — HYDROXYZINE HCL 25 MG PO TABS
25.0000 mg | ORAL_TABLET | Freq: Three times a day (TID) | ORAL | Status: DC | PRN
  Filled 2020-07-31: qty 1

## 2020-07-31 MED ORDER — OLANZAPINE 5 MG PO TBDP
ORAL_TABLET | ORAL | Status: AC
  Filled 2020-07-31: qty 1

## 2020-07-31 MED ORDER — OLANZAPINE 5 MG PO TBDP
5.0000 mg | ORAL_TABLET | Freq: Two times a day (BID) | ORAL | Status: DC
  Administered 2020-07-31 – 2020-08-02 (×5): 5 mg via ORAL
  Filled 2020-07-31 (×14): qty 1

## 2020-07-31 MED ORDER — ACETAMINOPHEN 325 MG PO TABS: 650.0000 mg | ORAL_TABLET | Freq: Four times a day (QID) | ORAL | Status: DC | PRN

## 2020-07-31 MED ORDER — TRAZODONE HCL 50 MG PO TABS
50.0000 mg | ORAL_TABLET | Freq: Every evening | ORAL | Status: DC | PRN
  Administered 2020-07-31 – 2020-08-01 (×2): 50 mg via ORAL
  Filled 2020-07-31 (×3): qty 1

## 2020-07-31 NOTE — ED Notes (Signed)
Pt off unit to [facility] per provider. Pt alert, no s/s of distress.  DC information and belongings given to [employer]. Pt ambulatory off unit, escorted off unit and transported by [employer]
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
## 2020-07-31 NOTE — Tx Team (Signed)
Initial Treatment Plan 07/31/2020 4:21 PM [place] [facility] [id number]    PATIENT STRESSORS: Health problems Medication change or noncompliance Traumatic event   PATIENT STRENGTHS: Active sense of humor Capable of independent living Motivation for treatment/growth Supportive family/friends   PATIENT IDENTIFIED PROBLEMS: delusional  paranoid  disorganized  Med noncompliant                DISCHARGE CRITERIA:  Ability to meet basic life and health needs Improved stabilization in mood, thinking, and/or behavior Motivation to continue treatment in a less acute level of care Need for constant or close observation no longer present  PRELIMINARY DISCHARGE PLAN: Attend aftercare/continuing care group Outpatient therapy Participate in family therapy Return to previous living arrangement  PATIENT/FAMILY INVOLVEMENT: This treatment plan has been presented to and reviewed with the patient, Sara Moon.  The patient and family have been given the opportunity to ask questions and make suggestions.  Raylene Miyamoto, RN 07/31/2020, 4:21 PM

## 2020-07-31 NOTE — Telephone Encounter (Signed)
Left HIPAA compliant message to return call at [phone]. Shann Medal RN BSN CNP [phone]

## 2020-07-31 NOTE — Progress Notes (Signed)
Adult Psychoeducational Group Note  Date:  07/31/2020 Time:  11:22 PM  Group Topic/Focus:  Wrap-Up Group:   The focus of this group is to help patients review their daily goal of treatment and discuss progress on daily workbooks.  Participation Level:  Did Not Attend  Participation Quality:  Did Not Attend  Affect:  Did Not Attend  Cognitive:  Did Not Attend  Insight: None  Engagement in Group:  Did Not Attend  Modes of Intervention:  Did Not Attend  Additional Comments:  Pt did not attend evening wrap up group tonight.  Felipa Furnace 07/31/2020, 11:22 PM

## 2020-07-31 NOTE — BH Assessment (Addendum)
BHH Assessment Progress Note  Per Shuvon Rankin, NP, this pt requires psychiatric hospitalization.  Danika, RN, [facility] has assigned pt to [facility] Rm 502-1 to the service of Landry Mellow, MD.  [facility] will be ready to receive pt at 15:00.  Pt presents under IVC initiated by EDP Lorre Nick, MD, and IVC documents have been faxed to [facility].  EDP Derwood Kaplan, MD and pt's nurse, Waynetta Sandy, have been notified, and Waynetta Sandy agrees to call report to [phone].  Pt is to be transported via Patent examiner.  Of note, pt's family, who have been involved with her during this ED encounter, have reportedly called for updates on her status and are upset about lack of information provided.  Interpreter will reportedly come to [facility] at an unspecified time later today.  This Clinical research associate will speak to pt when she arrives and will discuss consent to release information with her.   Doylene Canning, [place] Behavioral Health Coordinator [phone]   Addendum:  Pt has signed Consent to Release information to her mother.  Doylene Canning, [place] Behavioral Health Coordinator [phone]

## 2020-08-01 DIAGNOSIS — F29 Unspecified psychosis not due to a substance or known physiological condition: Secondary | ICD-10-CM | POA: Diagnosis not present

## 2020-08-01 MED ORDER — LORAZEPAM 1 MG PO TABS
2.0000 mg | ORAL_TABLET | Freq: Four times a day (QID) | ORAL | Status: DC | PRN
  Administered 2020-08-01 – 2020-08-02 (×3): 2 mg via ORAL
  Filled 2020-08-01 (×3): qty 2

## 2020-08-01 MED ORDER — DIVALPROEX SODIUM 250 MG PO DR TAB
750.0000 mg | DELAYED_RELEASE_TABLET | Freq: Two times a day (BID) | ORAL | Status: DC
  Administered 2020-08-01 – 2020-08-02 (×3): 750 mg via ORAL
  Filled 2020-08-01 (×8): qty 3

## 2020-08-01 NOTE — BHH Suicide Risk Assessment (Signed)
[facility] Admission Suicide Risk Assessment   Nursing information obtained from:  Patient Demographic factors:  Unemployed,Adolescent or young adult Current Mental Status:  NA Loss Factors:  Decline in physical health Historical Factors:  Impulsivity Risk Reduction Factors:  Pregnancy,Sense of responsibility to family,Positive social support,Positive coping skills or problem solving skills,Religious beliefs about death,Living with another person, especially a relative,Positive therapeutic relationship  Total Time spent with patient: 20 minutes Principal Problem: Psychotic disorder (HCC) Diagnosis:  Principal Problem:   Psychotic disorder (HCC) Active Problems:   Seizure disorder (HCC)  Subjective Data: 20 year old female presenting in a psychotic state with delusional beliefs, SI, and bizarre aggressive behavior.   Continued Clinical Symptoms:  Alcohol Use Disorder Identification Test Final Score (AUDIT): 0 The "Alcohol Use Disorders Identification Test", Guidelines for Use in Primary Care, Second Edition.  World Science writer [facility]). Score between 0-7:  no or low risk or alcohol related problems. Score between 8-15:  moderate risk of alcohol related problems. Score between 16-19:  high risk of alcohol related problems. Score 20 or above:  warrants further diagnostic evaluation for alcohol dependence and treatment.   CLINICAL FACTORS:   Currently Psychotic  See H&P for mental status exam  COGNITIVE FEATURES THAT CONTRIBUTE TO RISK:  None    SUICIDE RISK:   Moderate:  Frequent suicidal ideation with limited intensity, and duration, some specificity in terms of plans, no associated intent, good self-control, limited dysphoria/symptomatology, some risk factors present, and identifiable protective factors, including available and accessible social support.   PLAN OF CARE: Continue care on inpatient unit  I certify that inpatient services furnished can reasonably be expected to  improve the patient's condition.   Clement Sayres, MD 08/01/2020, 4:18 PM
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
## 2020-08-01 NOTE — Progress Notes (Signed)
Pt denies HI/SI/AVH. Pt Preoccupied with wanting to go home.  RN spoke to pt through interpreter services machine.  Pt requesting anxiety medication. 2mg  ativan given.  Pt requested pitcher of water and asked that staff bring back pt's meals to her room.  Pt took medications without incident, and no adverse reactions were noted. Pt calm at this time. Pt remains safe with q 15 min checks in place.

## 2020-08-01 NOTE — BHH Counselor (Signed)
CSW attempted to complete PSA with this patient. CSW attempted to use language interpretor for Fars[place] after pt stated she was able to speak [place]. CSW attempted to use two different Farsi interpretors who both stated that this patient speaks Dari and will need to use a Dari interpretor. CSW attempted to use interpretor for Dari using the audio only option, however no interpretors were available. CSW scheduled an appointment with interpretor to complete PSA on Friday 2/18 at 10:00am.     Ruthann Cancer MSW, LCSW Clincal Social Worker  [facility]

## 2020-08-02 ENCOUNTER — Ambulatory Visit (HOSPITAL_COMMUNITY)
Admit: 2020-08-02 | Discharge: 2020-08-02 | Disposition: A | Discharge status: Home / Self Care | Payer: Medicaid Other | Attending: Psychiatry | Admitting: Psychiatry

## 2020-08-02 ENCOUNTER — Ambulatory Visit (HOSPITAL_COMMUNITY)
Admission: RE | Admit: 2020-08-02 | Discharge: 2020-08-02 | Disposition: A | Discharge status: Home / Self Care | Payer: Medicaid Other | Source: Ambulatory Visit | Attending: Psychiatry | Admitting: Psychiatry

## 2020-08-02 ENCOUNTER — Telehealth: Payer: Self-pay | Admitting: Pediatric Intensive Care

## 2020-08-02 ENCOUNTER — Other Ambulatory Visit (HOSPITAL_COMMUNITY): Payer: Medicaid Other

## 2020-08-02 DIAGNOSIS — G929 Unspecified toxic encephalopathy: Secondary | ICD-10-CM | POA: Diagnosis present

## 2020-08-02 DIAGNOSIS — F29 Unspecified psychosis not due to a substance or known physiological condition: Secondary | ICD-10-CM | POA: Diagnosis not present

## 2020-08-02 LAB — CBC WITH DIFFERENTIAL/PLATELET
Abs Immature Granulocytes: 0.04 10*3/uL (ref 0.00–0.07)
Basophils Absolute: 0.1 10*3/uL (ref 0.0–0.1)
Basophils Relative: 1 %
Eosinophils Absolute: 0.2 10*3/uL (ref 0.0–0.5)
Eosinophils Relative: 3 %
HCT: 39.7 % (ref 36.0–46.0)
Hemoglobin: 12.7 g/dL (ref 12.0–15.0)
Immature Granulocytes: 1 %
Lymphocytes Relative: 29 %
Lymphs Abs: 1.7 10*3/uL (ref 0.7–4.0)
MCH: 28.4 pg (ref 26.0–34.0)
MCHC: 32 g/dL (ref 30.0–36.0)
MCV: 88.8 fL (ref 80.0–100.0)
Monocytes Absolute: 0.4 10*3/uL (ref 0.1–1.0)
Monocytes Relative: 7 %
Neutro Abs: 3.5 10*3/uL (ref 1.7–7.7)
Neutrophils Relative %: 59 %
Platelets: 319 10*3/uL (ref 150–400)
RBC: 4.47 MIL/uL (ref 3.87–5.11)
RDW: 13.6 % (ref 11.5–15.5)
WBC: 5.9 10*3/uL (ref 4.0–10.5)
nRBC: 0 % (ref 0.0–0.2)

## 2020-08-02 LAB — TSH: TSH: 0.406 u[IU]/mL (ref 0.350–4.500)

## 2020-08-02 LAB — COMPREHENSIVE METABOLIC PANEL
ALT: 12 U/L (ref 0–44)
AST: 15 U/L (ref 15–41)
Albumin: 4.4 g/dL (ref 3.5–5.0)
Alkaline Phosphatase: 50 U/L (ref 38–126)
Anion gap: 11 (ref 5–15)
BUN: 16 mg/dL (ref 6–20)
CO2: 24 mmol/L (ref 22–32)
Calcium: 9.1 mg/dL (ref 8.9–10.3)
Chloride: 106 mmol/L (ref 98–111)
Creatinine, Ser: 0.87 mg/dL (ref 0.44–1.00)
GFR, Estimated: >60 mL/min (ref >60)
Glucose, Bld: 112 mg/dL — ABNORMAL HIGH (ref 70–99)
Potassium: 3.9 mmol/L (ref 3.5–5.1)
Sodium: 141 mmol/L (ref 135–145)
Total Bilirubin: 0.6 mg/dL (ref 0.3–1.2)
Total Protein: 7.3 g/dL (ref 6.5–8.1)

## 2020-08-02 LAB — LIPID PANEL
Cholesterol: 152 mg/dL (ref 0–200)
HDL: 54 mg/dL (ref >40)
LDL Cholesterol: 85 mg/dL (ref 0–99)
Total CHOL/HDL Ratio: 2.8 RATIO
Triglycerides: 64 mg/dL (ref <150)
VLDL: 13 mg/dL (ref 0–40)

## 2020-08-02 LAB — HEMOGLOBIN A1C
Hgb A1c MFr Bld: 5.3 % (ref 4.8–5.6)
Mean Plasma Glucose: 105.41 mg/dL

## 2020-08-02 MED ORDER — LORAZEPAM 1 MG PO TABS: 2.0000 mg | ORAL_TABLET | Freq: Once | ORAL | Status: DC

## 2020-08-02 MED ORDER — LORAZEPAM 1 MG PO TABS
1.0000 mg | ORAL_TABLET | Freq: Once | ORAL | Status: AC | PRN
  Administered 2020-08-02: 1 mg via ORAL
  Filled 2020-08-02: qty 1

## 2020-08-02 MED ORDER — GADOBUTROL 1 MMOL/ML IV SOLN
5.5000 mL | Freq: Once | INTRAVENOUS | Status: AC | PRN
  Administered 2020-08-02: 5.5 mL via INTRAVENOUS

## 2020-08-02 NOTE — Progress Notes (Signed)
Pt returned from MRI at the hospital. Pt VS assessed. VS WNL. Pt given fluids and nighttime meds per MAR. According to sitter, pt because agitated when time to leave and had to be escorted from the facility by security. Pt did not want to leave. Pt also given PRN medication per [facility] for agitation. Will continue to monitor.

## 2020-08-02 NOTE — BHH Counselor (Signed)
CSW spoke with Garwin Brothers with [employer] who has been assisting this patient and her family in the community. She is currently serving as their intensive case Production designer, theatre/television/film. Ms Chelette's contact information is: Cell- [phone] and Office- [phone].   CSW will try to obtain ROI from patient in order to speak further with Ms. Chelette.     Ruthann Cancer MSW, LCSW Clincal Social Worker  [facility]

## 2020-08-02 NOTE — Tx Team (Signed)
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 acetaminophen (TYLENOL) tablet 650 mg  650 mg Oral Q6H PRN Rankin, Shuvon B, NP      . divalproex (DEPAKOTE) DR tablet 750 mg  750 mg Oral BID Briant Cedar, MD   750 mg at 08/02/20 0909  . hydrOXYzine (ATARAX/VISTARIL) tablet 25 mg  25 mg Oral TID PRN Rankin, Shuvon B, NP      . LORazepam (ATIVAN) tablet 2 mg  2 mg Oral Q6H PRN Briant Cedar, MD   2 mg at 08/01/20 2126  . OLANZapine zydis (ZYPREXA) disintegrating tablet 5 mg  5 mg Oral BID Rankin, Shuvon B, NP   5 mg at 08/02/20 0909  . traZODone (DESYREL) tablet 50 mg  50 mg Oral QHS PRN Rankin, Shuvon B, NP   50 mg at 08/01/20 2126   PTA Medications: Medications Prior to Admission  Medication Sig Dispense Refill Last Dose  . ALPRAZolam (XANAX) 0.5 MG tablet for sedation before MRI scan; take 1 tab 1 hour before scan; may repeat 1 tab 15 min before scan 3 tablet 0   . divalproex (DEPAKOTE) 500 MG DR tablet Take 1 tablet (500 mg total) by mouth 2 (two) times daily. (Patient not taking: No sig reported) 60 tablet 2     Patient Stressors: Health problems Medication change or noncompliance Traumatic event  Patient Strengths: Active sense of humor Capable of independent living Motivation for treatment/growth Supportive family/friends  Treatment Modalities: Medication Management, Group therapy, Case management,  1 to 1 session with clinician, Psychoeducation, Recreational therapy.   Physician Treatment Plan for Primary Diagnosis: Psychotic disorder ([place]) Long Term Goal(s): Improvement in symptoms so as ready  for discharge Improvement in symptoms so as ready for discharge   Short Term Goals: Ability to verbalize feelings will improve Ability to disclose and discuss suicidal ideas Ability to demonstrate self-control will improve Ability to identify and develop effective coping behaviors will improve Ability to verbalize feelings will improve Ability to disclose and discuss suicidal ideas Ability to demonstrate self-control will improve Ability to identify and develop effective coping behaviors will improve  Medication Management: Evaluate patient's response, side effects, and tolerance of medication regimen.  Therapeutic Interventions: 1 to 1 sessions, Unit Group sessions and Medication administration.  Evaluation of Outcomes: Not Met  Physician Treatment Plan for Secondary Diagnosis: Principal Problem:   Psychotic disorder ([place]) Active Problems:   Seizure disorder ([place])  Long Term Goal(s): Improvement in symptoms so as ready for discharge Improvement in symptoms so as ready for discharge   Short Term Goals: Ability to verbalize feelings will improve Ability to disclose and discuss suicidal ideas Ability to demonstrate self-control will improve Ability to identify and develop effective coping behaviors will improve Ability to verbalize feelings will improve Ability to disclose and discuss suicidal ideas Ability to demonstrate self-control will improve Ability to identify and develop effective coping behaviors will improve     Medication Management: Evaluate patient's response, side effects, and tolerance of medication regimen.  Therapeutic Interventions: 1 to 1 sessions, Unit Group sessions and Medication administration.  Evaluation of Outcomes: Not Met   RN Treatment Plan for Primary Diagnosis: Psychotic disorder ([place]) Long Term Goal(s):  Knowledge of disease and therapeutic regimen to maintain health will improve  Short Term Goals: Ability to remain free from injury will  improve, Ability to participate in decision making will improve, Ability to verbalize feelings will improve, Ability to disclose and discuss suicidal ideas and Ability to identify and develop effective coping behaviors will improve  Medication Management: RN will administer medications as ordered by provider, will assess and evaluate patient's response and provide education to patient for prescribed medication. RN will report any adverse and/or side effects to prescribing provider.  Therapeutic Interventions: 1 on 1 counseling sessions, Psychoeducation, Medication administration, Evaluate responses to treatment, Monitor vital signs and CBGs as ordered, Perform/monitor CIWA, COWS, AIMS and Fall Risk screenings as ordered, Perform wound care treatments as ordered.  Evaluation of Outcomes: Not Met   LCSW Treatment Plan for Primary Diagnosis: Psychotic disorder [facility]) Long Term Goal(s): Safe transition to appropriate next level of care at discharge, Engage patient in therapeutic group addressing interpersonal concerns.  Short Term Goals: Engage patient in aftercare planning with referrals and resources, Increase social support, Increase emotional regulation, Facilitate acceptance of mental health diagnosis and concerns, Identify triggers associated with mental health/substance abuse issues and Increase skills for wellness and recovery  Therapeutic Interventions: Assess for all discharge needs, 1 to 1 time with Social worker, Explore available resources and support systems, Assess for adequacy in community support network, Educate family and significant other(s) on suicide prevention, Complete Psychosocial Assessment, Interpersonal group therapy.  Evaluation of Outcomes: Not Met   Progress in Treatment: Attending groups: No. Participating in groups: No. Taking medication as prescribed: Yes. Toleration medication: Yes. Family/Significant other contact made: No, will contact:  If consents are given  \ Patient understands diagnosis: No. Discussing patient identified problems/goals with staff: Yes. Medical problems stabilized or resolved: Yes. Denies suicidal/homicidal ideation: Yes. Issues/concerns per patient self-inventory: No.   New problem(s) identified: No, Describe:  None  New Short Term/Long Term Goal(s): medication stabilization, elimination of SI thoughts, development of comprehensive mental wellness plan.   Patient Goals:  "To go home"   Discharge Plan or Barriers: Patient recently admitted. CSW will continue to follow and assess for appropriate referrals and possible discharge planning.   Reason for Continuation of Hospitalization: Delusions  Medication stabilization  Estimated Length of Stay: 3 to 5 days   Attendees: Patient: Sara Moon 08/02/2020   Physician: Lala Lund, MD 08/02/2020   Nursing:  08/02/2020   RN Care Manager: 08/02/2020   Social Worker: Verdis Frederickson, Ephraim  08/02/2020   Recreational Therapist:  08/02/2020   Other:  08/02/2020   Other:  08/02/2020   Other: 08/02/2020     Scribe for Treatment Team: Darleen Crocker, LCSWA 08/02/2020 10:57 AM

## 2020-08-02 NOTE — Progress Notes (Signed)
   08/02/20 2241  Psych Admission Type (Psych Patients Only)  Admission Status Involuntary  Psychosocial Assessment  Patient Complaints Other (Comment) (thirsty)  Eye Contact Fair  Facial Expression Anxious;Pensive;Worried  Affect Anxious;Apprehensive;Preoccupied  Speech Language other than English (Speaks Dari)  Interaction Assertive  Motor Activity Slow  Appearance/Hygiene Unremarkable  Behavior Characteristics Cooperative;Anxious;Guarded  Mood Anxious;Sad  Thought Process  Coherency Circumstantial  Content Preoccupation  Delusions Somatic (believes she is pregnant)  Perception UTA  Hallucination None reported or observed  Judgment WDL  Confusion None  Danger to Self  Current suicidal ideation? Denies  Danger to Others  Danger to Others None reported or observed   Spoke with pt using Dari audio interpreter [place] [phone]. Pt denies SI, HI, AVH and pain. Pt denies anxiety although has anxious affect. Pt given water and VS assessed upon return from MRI. Pt sad and guarded as well.

## 2020-08-02 NOTE — [facility] Group Notes (Signed)
08/02/20   Type of Therapy and Topic:  Group Therapy:  Setting Goals   Participation Level:  Did not attend   Description of Group: In this process group, patients discussed using strengths to work toward goals and address challenges.  Patients identified two positive things about themselves and one goal they were working on.  Patients were given the opportunity to share openly and support each other's plan for self-empowerment.  The group discussed the value of gratitude and were encouraged to have a daily reflection of positive characteristics or circumstances.  Patients were encouraged to identify a plan to utilize their strengths to work on current challenges and goals.   Therapeutic Goals 1. Patient will verbalize personal strengths/positive qualities and relate how these can assist with achieving desired personal goals 2. Patients will verbalize affirmation of peers plans for personal change and goal setting 3. Patients will explore the value of gratitude and positive focus as related to successful achievement of goals 4. Patients will verbalize a plan for regular reinforcement of personal positive qualities and circumstances.   Summary of Patient Progress: Patient identified the definition of goals. Patients was given the opportunity to share openly and support other group members' plan for self-empowerment. Patient verbalized personal strength and how they relate to achieving the desired goal. Patient was able to identify positive goals to work towards when she returns home.     Sara Moon did not attend the group and did not want to accept the handouts.  She began shaking her head no and making noises at the CSW.  CSW left the group packet in Sara Moon's room.    Therapeutic Modalities Cognitive Behavioral Therapy Motivational Interviewing

## 2020-08-02 NOTE — BHH Counselor (Signed)
CSW met with this pt with the assistance of [name] for interpretation [phone]). CSW was unable to complete PSA with this patient due to patient being frustrated and yelling at interpretor. Pt told interpretor to answer all the questions for her and would not provide answers. She stated she felt as if she is going crazy and that we need to help her with this. Patient expressed that she wants to go home, but also states she does not feel right. Patient believes she is pregnant and that this is the only issue she came to seek help for.   CSW was unable to get consent for pt's family at this time.  CSW will attempt to schedule another appointment with [name] to attempt PSA at a later time.     Darletta Moll MSW, LCSW Clincal Social Worker  [facility]

## 2020-08-02 NOTE — Progress Notes (Signed)
Dar Note: Patient presents with irritable affect and mood.  Used Licensed [employer] for [employer].  Patient voiced concern and frustration about being in the hospital.  Stated she is more stressed being here than when at home.  Requesting for discharge and wants to know her plan of care after discharge.  Denies suicidal thoughts, auditory and visual hallucinations.  Medication given as prescribed.  Routine safety checks maintained.  Patient is safe on the unit.

## 2020-08-02 NOTE — Progress Notes (Signed)
   08/01/20 2030  Psych Admission Type (Psych Patients Only)  Admission Status Involuntary  Psychosocial Assessment  Patient Complaints Worrying  Eye Contact Fair  Facial Expression Anxious;Pensive;Worried  Affect Anxious;Apprehensive;Preoccupied  Speech Language other than English (Farsi)  Teacher, music;Restless  Appearance/Hygiene Unremarkable  Behavior Characteristics Cooperative;Anxious;Fidgety  Mood Anxious;Preoccupied  Thought Process  Coherency Disorganized;Flight of ideas  Content Preoccupation  Delusions Other (Comment) (wanting food)  Perception UTA  Hallucination None reported or observed  Judgment WDL  Confusion None  Danger to Self  Current suicidal ideation? Denies  Danger to Others  Danger to Others None reported or observed   Used Interpreter Services ([employer] [phone]) for Farsi to speak with patient. Pt asking about meat provided in meals and asking if it complies with standards of Halal. Told pt that it was doubtful that there were any Halal-compliant meats prepared here. Pt asked for other foods. Given garden salad and saltine crackers to eat. Pt denies SI, HI, AVH and pain. Pt states her day was good. Pt fidgety and anxious even though denies anxiety. Pt took all meds as prescribed.
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
## 2020-08-02 NOTE — Progress Notes (Signed)
Recreation Therapy Notes  Date:  2.18.22 Time: 0930 Location: [address] Group Room  Group Topic: Stress Management  Goal Area(s) Addresses:  Patient will identify positive stress management techniques. Patient will identify benefits of using stress management post d/c.  Intervention: Stress Management  Activity: Meditation.  LRT played a meditation on mindful walking. Patients were to listen and envision themselves walking along the beach enjoying sights, sounds and the feeling of what it would be like to feel the sun and water on their skin.  Patients were to listen and follow along as meditation played to engage in activity.    Education:  Stress Management, Discharge Planning.   Education Outcome: Acknowledges Education  Clinical Observations/Feedback: Pt did not attend group session.    Farris Geiman, LRT/CTRS         Sara Moon A 08/02/2020 11:28 AM 

## 2020-08-03 ENCOUNTER — Encounter (HOSPITAL_COMMUNITY): Payer: Self-pay | Admitting: Internal Medicine

## 2020-08-03 ENCOUNTER — Other Ambulatory Visit (HOSPITAL_COMMUNITY): Payer: Self-pay | Admitting: Psychiatry

## 2020-08-03 ENCOUNTER — Inpatient Hospital Stay (HOSPITAL_COMMUNITY)
Admit: 2020-08-03 | Discharge: 2020-08-05 | DRG: 101 | Disposition: A | Discharge status: Home / Self Care | Payer: Medicaid Other | Attending: Internal Medicine | Admitting: Internal Medicine

## 2020-08-03 DIAGNOSIS — R45851 Suicidal ideations: Secondary | ICD-10-CM | POA: Diagnosis present

## 2020-08-03 DIAGNOSIS — G40909 Epilepsy, unspecified, not intractable, without status epilepticus: Secondary | ICD-10-CM | POA: Diagnosis not present

## 2020-08-03 DIAGNOSIS — F29 Unspecified psychosis not due to a substance or known physiological condition: Secondary | ICD-10-CM | POA: Diagnosis not present

## 2020-08-03 DIAGNOSIS — F22 Delusional disorders: Secondary | ICD-10-CM | POA: Diagnosis present

## 2020-08-03 DIAGNOSIS — R32 Unspecified urinary incontinence: Secondary | ICD-10-CM | POA: Diagnosis present

## 2020-08-03 DIAGNOSIS — Z9049 Acquired absence of other specified parts of digestive tract: Secondary | ICD-10-CM

## 2020-08-03 DIAGNOSIS — F09 Unspecified mental disorder due to known physiological condition: Secondary | ICD-10-CM

## 2020-08-03 DIAGNOSIS — E538 Deficiency of other specified B group vitamins: Secondary | ICD-10-CM

## 2020-08-03 DIAGNOSIS — Z79899 Other long term (current) drug therapy: Secondary | ICD-10-CM

## 2020-08-03 DIAGNOSIS — Z20822 Contact with and (suspected) exposure to covid-19: Secondary | ICD-10-CM | POA: Diagnosis present

## 2020-08-03 DIAGNOSIS — F32A Depression, unspecified: Secondary | ICD-10-CM | POA: Diagnosis present

## 2020-08-03 DIAGNOSIS — G9340 Encephalopathy, unspecified: Secondary | ICD-10-CM | POA: Diagnosis present

## 2020-08-03 MED ORDER — DIVALPROEX SODIUM 250 MG PO DR TAB
1000.0000 mg | DELAYED_RELEASE_TABLET | Freq: Every day | ORAL | Status: DC
  Administered 2020-08-04: 1000 mg via ORAL
  Filled 2020-08-03: qty 4

## 2020-08-03 MED ORDER — DIVALPROEX SODIUM 500 MG PO DR TAB: 1000.0000 mg | DELAYED_RELEASE_TABLET | Freq: Every day | ORAL | 2 refills | Status: DC

## 2020-08-03 MED ORDER — OLANZAPINE 10 MG PO TABS
10.0000 mg | ORAL_TABLET | Freq: Every day | ORAL | Status: DC
  Administered 2020-08-03: 10 mg via ORAL
  Filled 2020-08-03 (×2): qty 1

## 2020-08-03 MED ORDER — ACETAMINOPHEN 650 MG RE SUPP: 650.0000 mg | Freq: Four times a day (QID) | RECTAL | Status: DC | PRN

## 2020-08-03 MED ORDER — SODIUM CHLORIDE 0.9 % IV SOLN
75.0000 mL/h | INTRAVENOUS | Status: AC
  Administered 2020-08-04: 75 mL/h via INTRAVENOUS

## 2020-08-03 MED ORDER — ACETAMINOPHEN 325 MG PO TABS: 650.0000 mg | ORAL_TABLET | Freq: Four times a day (QID) | ORAL | Status: DC | PRN

## 2020-08-03 MED ORDER — DIVALPROEX SODIUM ER 500 MG PO TB24
1000.0000 mg | ORAL_TABLET | Freq: Every day | ORAL | Status: DC
  Administered 2020-08-03: 1000 mg via ORAL
  Filled 2020-08-03 (×2): qty 2

## 2020-08-03 NOTE — H&P (Signed)
Sara Moon JXB:[id number] DOB: 23-May-2001 DOA: 08/03/2020     PCP: Quincy Simmonds, MD   Outpatient Specialists:  NONE    Patient arrived to ER on  at  Referred by Attending Therisa Doyne, MD   Patient coming from: home Lives With family   Chief Complaint: psychosis   HPI: Sara Moon is a Farsi  Speaking Afgan 20 y.o. 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 seizure Do   Interpreter was not available at the time of my evaluation of the patient history was obtained through the chart interpreter may be available tomorrow Presented with   originally presented to emergency department last week with delusions that she is pregnant and wanting to kill it because she is not a married woman she had a plan to starve herself to death. Family stated patient's behavior has been progressively getting more bizarre for past 6 months Patient as noted above [place] and has gradually decompensated the last few months with ongoing depressive symptoms. Admitted to behavioral health for psychosis Per family she has been having daily episodes of unresponsiveness associated rhythmic shaking urine incontinence Since age 58 lasting a few minutes.  Apparently NUS patient was started on Depakote.  This was stopped about a week ago as patient was not tolerating he had no appetite. Neurology was consulted while behavioral health recommended restarting Depakote at 750 p.o. twice daily Patient was somnolent during the day so her Depakote was changed again for ER 1000 mg p.o. 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      Hospitalist was called for admission for evaluation of possible seizure DO   Otherwise labs showing:    Recent Labs  Lab 08/02/20 1805  NA 141  K 3.9  CO2 24  GLUCOSE 112*  BUN 16  CREATININE 0.87  CALCIUM 9.1    Cr stable,   Lab Results  Component Value Date   CREATININE 0.87 08/02/2020   CREATININE 0.51 07/25/2020   CREATININE 0.60 07/25/2020    Recent Labs  Lab 08/02/20 1805  AST 15  ALT 12  ALKPHOS 50  BILITOT 0.6  PROT 7.3  ALBUMIN 4.4   Lab Results  Component Value Date   CALCIUM 9.1 08/02/2020  WBC     Component Value Date/Time   WBC 5.9 08/02/2020 1805   LYMPHSABS 1.7 08/02/2020 1805   MONOABS 0.4 08/02/2020 1805   EOSABS 0.2 08/02/2020 1805   BASOSABS 0.1 08/02/2020 1805    Plt: Lab Results  Component Value Date   PLT 319 08/02/2020       COVID-19 Labs  No results for input(s): DDIMER, FERRITIN, LDH, CRP in the last 72 hours.  Lab Results  Component Value Date   SARSCOV2NAA NEGATIVE 07/31/2020    HG/HCT  stable,     Component Value Date/Time   HGB 12.7 08/02/2020 1805   HGB 12.6 07/04/2020 1126   HCT 39.7 08/02/2020 1805   HCT 37.7 07/04/2020 1126   MCV 88.8 08/02/2020 1805  MCV 84 07/04/2020 1126    ECG: Ordered Personally reviewed by me showing: HR : 106 Rhythm:  Sinus tachycardia no evidence of ischemic changes QTC 425   BNP (last 3 results) No results for input(s): BNP in the last 8760 hours.        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 These are nonspecific and could be chronic insults, even related to prenatal or perinatal insult.    ED Triage Vitals [08/03/20 2211]  Enc Vitals Group     BP  108/69     Pulse Rate 86     Resp 20     Temp 98.9 F (37.2 C)     Temp Source Oral     SpO2 100 %     Weight      Height      Head Circumference      Peak Flow      Pain Score      Pain Loc      Pain Edu?      Excl. in GC?   ZOXW(96)@TMAX(24)@       Latest  Blood pressure 108/69, pulse 86, temperature 98.9 F (37.2 C), temperature source Oral, resp. rate 20, SpO2 100 %.     Review of Systems:    Pertinent positives include:  confusion change in mood depression or anxiety.  Constitutional:  No weight loss, night sweats, Fevers, chills, fatigue, weight loss  HEENT:  No headaches, Difficulty swallowing,Tooth/dental problems,Sore throat,  No sneezing, itching, ear ache, nasal congestion, post nasal drip,  Cardio-vascular:  No chest pain, Orthopnea, PND, anasarca, dizziness, palpitations.no Bilateral lower extremity swelling  GI:  No heartburn, indigestion, abdominal pain, nausea, vomiting, diarrhea, change in bowel habits, loss of appetite, melena, blood in stool, hematemesis Resp:  no shortness of breath at rest. No dyspnea on exertion, No excess mucus, no productive cough, No non-productive cough, No coughing up of blood.No change in color of mucus.No wheezing. Skin:  no rash or lesions. No jaundice GU:  no dysuria, change in color of urine, no urgency or frequency. No straining to urinate.  No flank pain.  Musculoskeletal:  No joint pain or no joint swelling. No decreased range of motion. No back pain.  Psych:  No or affect. No  No memory loss.  Neuro: no localizing neurological complaints, no tingling, no weakness, no double vision, no gait abnormality, no slurred speech, no All systems reviewed and apart from HOPI all are negative  Past Medical History:   Past Medical History:  Diagnosis Date  . Concern for seizure disorder (HCC)   . Depression        Past Surgical History:  Procedure Laterality Date  . APPENDECTOMY  2008    Social History:  Ambulatory   independently     reports that she has never smoked. She has never used smokeless tobacco. She reports that she does not drink alcohol and does not use drugs.   Family History:   Family History  Problem Relation Age of Onset  . Hypertension Other     Allergies: No Known Allergies   Prior to Admission medications   Medication Sig Start Date End Date Taking? Authorizing Provider  divalproex (DEPAKOTE) 500 MG DR tablet Take 2 tablets (1,000 mg total) by  mouth at bedtime. 08/03/20 11/01/20  Laveda Abbe, NP   Physical Exam: Vitals with BMI 08/03/2020 08/02/2020 07/31/2020  Height - - 5\' 1"   Weight - - 112 lbs  BMI - - 21.17  Systolic 108 113 97  Diastolic 69 68 56  Pulse 86 91 98   1. General:  in No Acute distress   Chronically ill  -appearing 2. Psychological: Alert and  Oriented 3. Head/ENT:     Dry Mucous Membranes                          Head Non traumatic, neck supple                            Poor Dentition 4. SKIN:   decreased Skin turgor,  Skin clean Dry and intact no rash 5. Heart: Regular rate and rhythm no  Murmur, no Rub or gallop 6. Lungs:  , no wheezes or crackles   7. Abdomen: Soft,  non-tender, Non distended  bowel sounds present 8. Lower extremities: no clubbing, cyanosis, no edema 9. Neurologically   strength 5 out of 5 in all 4 extremities cranial nerves II through XII intact 10. MSK: Normal range of motion   All other LABS:     Recent Labs  Lab 08/02/20 1805  WBC 5.9  NEUTROABS 3.5  HGB 12.7  HCT 39.7  MCV 88.8  PLT 319     Recent Labs  Lab 08/02/20 1805  NA 141  K 3.9  CL 106  CO2 24  GLUCOSE 112*  BUN 16  CREATININE 0.87  CALCIUM 9.1     Recent Labs  Lab 08/02/20 1805  AST 15  ALT 12  ALKPHOS 50  BILITOT 0.6  PROT 7.3  ALBUMIN 4.4   Cultures: No results found for: SDES, SPECREQUEST, CULT, REPTSTATUS   Radiological Exams on Admission: MR BRAIN W CONTRAST  Result Date: 08/03/2020 CLINICAL DATA:   Encephalopathy. Follow-up of MRI without contrast done 07/25/2020. EXAM: MRI HEAD WITH CONTRAST TECHNIQUE: Multiplanar, multiecho pulse sequences of the brain and surrounding structures were obtained with intravenous contrast. CONTRAST:  5.34mL GADAVIST GADOBUTROL 1 MMOL/ML IV SOLN COMPARISON:  None. FINDINGS: There is no abnormal contrast enhancement. Nonspecific foci of hyperintense T2-weighted signal within the periventricular white matter are unchanged. Benign arachnoid cyst adjacent to the right frontal operculum. IMPRESSION: No contrast-enhancing lesion. Unchanged nonspecific foci of hyperintense T2-weighted signal. Electronically Signed   By: 4m M.D.   On: 08/03/2020 03:03   MR CERVICAL SPINE W WO CONTRAST  Result Date: 08/03/2020 CLINICAL DATA:  Encephalopathy.  Question demyelinating disease. EXAM: MRI CERVICAL SPINE WITHOUT AND WITH CONTRAST TECHNIQUE: Multiplanar and multiecho pulse sequences of the cervical spine, to include the craniocervical junction and cervicothoracic junction, were obtained without and with intravenous contrast. CONTRAST:  5.5 mL GADAVIST IV SOLN COMPARISON:  Brain MRI this same day. FINDINGS: Alignment: Normal. Vertebrae: No fracture, evidence of discitis, or bone lesion. Cord: Normal signal and morphology. No pathologic enhancement after contrast administration. Posterior Fossa, vertebral arteries, paraspinal tissues: Negative. Disc levels: Disc height and hydration are maintained at all levels. The central canal and foramina are widely patent throughout. IMPRESSION: Negative for demyelinating disease.  Normal cervical spine MRI. Electronically Signed   By: 08/05/2020 M.D.   On: 08/03/2020 11:18    Chart has been reviewed  Assessment/Plan  20 y.o. 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 seizure Do Admitted for work up for possible seizure  Present on Admission: . Psychotic disorder (HCC) -behavioral health  consult ordered Sitter for safety  until assessed by psychiatry  Possible seizure Do - appreciate neurology consult, initiate work up for rheumatologic causes with ANA, sed rate, CRP, check heavy metal screen, cerulloplasmin Other plan as per orders. Seizure precautions Continue Depakote Appreciate neurology consult for EEG continues with bedside and video monitoring.  History of suicidal ideations -initially was endorsing suicidal ideations appears to be at rest currently but unable to elucidate that currently secondary to lack of interpreter.  Continue with safety precautions for now DVT prophylaxis:  SCD        Code Status:    Code Status: Prior FULL CODE   Family Communication:   Family not at  Bedside     Disposition Plan:     likely will long-term psychiatric placement                          Following barriers for discharge:                                                       Will need consultants to evaluate patient prior to discharge                       Behavioral health  consulted                    Consults called: neurology is aware   Obs   Level of care   tele  For 12H    Lab Results  Component Value Date   SARSCOV2NAA NEGATIVE 07/31/2020     Precautions: admitted as   Covid Negative     PPE: Used by the provider:   N95  eye Goggles,  Gloves    Sara Moon 08/04/2020, 1:16 AM    [facility]     after 2 AM please page floor coverage PA If 7AM-7PM, please contact the day team taking care of the patient using [email or web address]   Patient was evaluated in the context of the global COVID-19 pandemic, which necessitated consideration that the patient might be at risk for infection with the SARS-CoV-2 virus that causes COVID-19. Institutional protocols and algorithms that pertain to the evaluation of patients at risk for COVID-19 are in a state of rapid change based on information released by regulatory bodies including the CDC and federal and  state organizations. These policies and algorithms were followed during the patient's care.
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
## 2020-08-03 NOTE — Consult Note (Addendum)
Neurology Consultation  Reason for Consult: seizure like activity Referring Physician: Wilhelmenia Blaselary Gm MD  CC: seizure like activity  History is obtained from: chart  HPI: [place] is a 20 y.o. female with a PMHx of seizure like activity and behavioral issues. She is a refugee from [place]. Per chart, mother states she is withdrawn at home and spends most of her time in a dark room. She also retreats there when she hears anything positive about her siblings. Over past couple of days patient expressed that her family and her needed to die. Patient also thought she was pregnant. [facility].   Also, this seizure like activity is reported by family as frequent daily episodes of unresponsiveness with associated rhythmic shaking an urinary incontinence. Episodes started at age 89 and usually last a couple of minutes. There is a question of her being post ictal afterwards. 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 Keppra was started then and unknown her hx since then except that when patient arrived in [place], she was evaluated and put on Depakot. Mother d/c'd this a week ago due to lack of appetite.   [facility], pt's out patient neurologist was contact and advised to restart Depakote at 750mg  po bid. Depakote level pending.   Interpretor used today. Patient answered a few of my questions, concentrating on only asking when she could leave. After a few minutes, she told NP that she did not want to talk to NP or anyone anymore.   Neurology was asked to consult for seizures. Per RN, they have not seen any seizure like activity since admission.  ROS: Unable to obtain due to lack of cooperation.   Past Medical History:  Diagnosis Date  . Concern for seizure disorder (HCC)   . Depression     History reviewed. No pertinent family history.  ? seizures  Social History:   reports that she has never smoked. She has never  used smokeless tobacco. She reports that she does not drink alcohol and does not use drugs.  Medications  Current Facility-Administered Medications:  .  acetaminophen (TYLENOL) tablet 650 mg, 650 mg, Oral, Q6H PRN, Rankin, Shuvon B, NP .  divalproex (DEPAKOTE ER) 24 hr tablet 1,000 mg, 1,000 mg, Oral, QHS, Pashayan, Mardelle MatteAlexander S, MD .  hydrOXYzine (ATARAX/VISTARIL) tablet 25 mg, 25 mg, Oral, TID PRN, Rankin, Shuvon B, NP .  LORazepam (ATIVAN) tablet 2 mg, 2 mg, Oral, Q6H PRN, Lauro FranklinPashayan, Alexander S, MD, 2 mg at 08/02/20 2221 .  OLANZapine (ZYPREXA) tablet 10 mg, 10 mg, Oral, QHS, Pashayan, Mardelle MatteAlexander S, MD .  traZODone (DESYREL) tablet 50 mg, 50 mg, Oral, QHS PRN, Rankin, Shuvon B, NP, 50 mg at 08/01/20 2126   Exam: Current vital signs: BP 113/68 (BP Location: Right Arm)   Pulse 91   Temp 98 F (36.7 C) (Oral)   Resp 16   Ht 5\' 1"  (1.549 m)   Wt 50.8 kg   SpO2 100%   BMI 21.16 kg/m  Vital signs in last 24 hours: Pulse Rate:  [91] 91 (02/18 2229) BP: (113)/(68) 113/68 (02/18 2229) SpO2:  [100 %] 100 % (02/18 2229)  GENERAL: Awake, alert in NAD HEENT: - Normocephalic and atraumatic LUNGS - Normal respiratory effort.  CV: uncooperative for exam. ABDOMEN - uncooperative Psych: distracted, withdrawn, calm  NEURO:  Mental Status: AA&Ox3  Speech/Language: speech is without dysarthria. Would not participated in naming,  repetition, fluency. Comprehension is intact with use of interpretor.  Cranial Nerves:  II: III, IV, VI: Lid elevation symmetric and full.  V, VII-would not allow testing.  VIII:hearing intact to voice IX, X: Phonation normal.  XI, XII-would not allow testing. Motor: would not allow testing. Bulk is normal. Ambulation is without ataxia.  Sensation-would not allow testing.  Coordination: would not allow testing.  Gait- observed patient walking to bathroom. No ataxia or slowing of ambulation. Normal stride and arm movement.   Labs I have reviewed labs in epic and  the results pertinent to this consultation are:  CBC    Component Value Date/Time   WBC 5.9 08/02/2020 1805   RBC 4.47 08/02/2020 1805   HGB 12.7 08/02/2020 1805   HGB 12.6 07/04/2020 1126   HCT 39.7 08/02/2020 1805   HCT 37.7 07/04/2020 1126   PLT 319 08/02/2020 1805   PLT 302 07/04/2020 1126   MCV 88.8 08/02/2020 1805   MCV 84 07/04/2020 1126   MCH 28.4 08/02/2020 1805   MCHC 32.0 08/02/2020 1805   RDW 13.6 08/02/2020 1805   RDW 13.1 07/04/2020 1126   LYMPHSABS 1.7 08/02/2020 1805   MONOABS 0.4 08/02/2020 1805   EOSABS 0.2 08/02/2020 1805   BASOSABS 0.1 08/02/2020 1805    CMP     Component Value Date/Time   NA 141 08/02/2020 1805   NA 141 07/25/2020 1020   K 3.9 08/02/2020 1805   CL 106 08/02/2020 1805   CO2 24 08/02/2020 1805   GLUCOSE 112 (H) 08/02/2020 1805   BUN 16 08/02/2020 1805   BUN 13 07/25/2020 1020   CREATININE 0.87 08/02/2020 1805   CALCIUM 9.1 08/02/2020 1805   PROT 7.3 08/02/2020 1805   PROT 7.5 07/25/2020 1020   ALBUMIN 4.4 08/02/2020 1805   ALBUMIN 5.2 (H) 07/25/2020 1020   AST 15 08/02/2020 1805   ALT 12 08/02/2020 1805   ALKPHOS 50 08/02/2020 1805   BILITOT 0.6 08/02/2020 1805   BILITOT 0.5 07/25/2020 1020   GFRNONAA >60 08/02/2020 1805   GFRAA 151 07/25/2020 1020    Lipid Panel     Component Value Date/Time   CHOL 152 08/02/2020 1805   TRIG 64 08/02/2020 1805   HDL 54 08/02/2020 1805   CHOLHDL 2.8 08/02/2020 1805   VLDL 13 08/02/2020 1805   LDLCALC 85 08/02/2020 1805     Imaging MD reviewed the images obtained  MRI brain with contrast No contrast-enhancing lesion. Unchanged nonspecific foci of hyperintense T2-weighted signal.  Assessment: 20 yo female with a questionable history of seizures but has been on medicines in the past. Keppra was started here, then on advice of out patient neurologist, was changed to Depakote 750mg  bid. Daily dose made her sleepy, so resident changed dose to Depakote ER 1000mg  po qhs. She was  admitted to [facility] for psychosis.    Impression: Seizure like activity with history of seizure disorder in the past.   Recommendations: -Continue Depakote ER 1 Gm at hs.  -We would consider EEG, but am unsure if this can be done at your facility and NP questions her cooperation.    Pt seen by , NP/Neuro and later by MD. Note/plan to be edited by MD as needed.  Pager: [facility]  Attestation:  I saw this patient with the APP on 08/03/20, obtained pertinent aspects of the history, and performed relevant physical and neurological examination as documented. Also, I reviewed the available laboratory data and neuroimages, and other relevant tests/notes/procedures.  I obtained history from the patient through a Dari interpreter, and also from the mother and members of the health care team. She has a history of a developmental delay of some sort, and functions at the "7th grade" level. She has a pathological jealousy about her new sister-in-law, and also has a history of seizure like episodes consisting of amnestic spells and shaking/jerking. The jerks do not occur during sleep and there is no family history of seizures. In addition to the jerks which sound myoclonic-like, she also has mannerisms consisting of rocking back and forth or side to side head swaying/turning. She denies depression or obsessions, but has a delusion of being pregnant. She's expressed homicidal and suicidal thoughts at times. She is currently on an involuntary commitment at the [facility]. Her first evaluation in the computer was 06/20/20, at which point she was noted to have episodes of rhythmic shaking suspicious for seizures. A history dating back to 20 years of age was obtained, and her mother says that she was treated in [place] with an unknown medication, and this completely stopped the posturing/deforming body contortions that occurred with these episodes. An EEG was ordered, but has not been  completed. 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 Basic metabolic screening labs are unrevealing.  Of interest, she was treated with depakote for the jerking spells, and subsequently seemed to get worse, with acute mania (slept only 2 hours a day), agitation, and threatening speech/behaviors. She developed paranoid delusions and the delusion that she is pregnant afterwards. Was taken off due to lack of appetite, and the level was barely detectable upon admission to the behavioral health hospital. She was subsequently increased to 1500 mg daily, and given neuroleptics. Now she's sleeping excessively and the dose was reduced to 1000 mg qhs. 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 In order to expedite her organic workup I was asked to see her and facilitate transfer to [facility] where she could undergo EEG monitoring and other testing as outlined below.  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 Question microcephaly (did not measure). Heart and lung examination normal. CN's II through XII normal with no obvious KF rings. Full visual fields. No nystagmus. Normal hearing. Normal tongue/palate. On motor exam, there is no tremor. No rigidity and normal muscle tone/bulk. The swaying movements (rocking her body and turning her head) are fully suppressible on command. Strength is 5/5. Coordination normal to HKS, FO, FNF, and FTN maneuvers. Sensory examination suggests subtle distal temperature and vibratory deficit. Areflexic in LEs and hyporeflexic in UE. Toes downgoing, bilaterally. She continues to be delusional, and had trouble understanding when I tried to explain the reason for transfer for more testing.  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 History of seizures in childhood, with spells suspicious of seizures, recently Borderline intellectual function vs poor educational attainment, or both Language barrier complicating assessment Episode of manic-like behavior (not diagnosed as true bipolar disorder by psychiatry) perhaps provoked by depakote Pathological jealousy suggesting that some of this behavior may be behavioral rather than organic  Recommendations: Transfer to [facility] for full organic workup to include: EEG and LTVM EEG to capture spells Medication taper (depakote), while on LTVM to assess whether this may in fact  be contributing to mental status changes Blood work to include full peripheral neuropathy screen, nutritional screen, autoimmune disorders, etc. Copper and ceruloplasmin (though I see no KF rings, and there is no susceptibility effect in lentiform nuclei on MRI) CSF testing for MS profile and other infectious, autoimmune disorders Urine testing for heavy metals, porphyrins Other testing per oncoming medicine and neurology teams.  I spent at least 2 hours in direct patient care, including conferring with several team members, the patient, family, interpreter, and performing the history and examination of this complicated patient.  I have place a call to the bed control office [phone]) to initiate the transfer process.  Thank you.  Meredeth Ide, MD

## 2020-08-03 NOTE — [facility] Group Notes (Signed)
[facility] Group Notes: (Clinical Social Work)   08/03/2020      Type of Therapy:  Group Therapy   Participation Level:  Did Not Attend - was invited both individually by [facility] and by overhead announcement, chose not to attend.   Ambrose Mantle, LCSW 08/03/2020, 3:11 PM
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
## 2020-08-03 NOTE — BHH Suicide Risk Assessment (Signed)
[facility] Discharge Suicide Risk Assessment   Principal Problem: Psychotic disorder [facility]) Discharge Diagnoses: Principal Problem:   Psychotic disorder (HCC) Active Problems:   Seizure disorder (HCC)  Total Time Spent in Direct Patient Care:  I personally spent 30 minutes on the unit in direct patient care. The direct patient care time included face-to-face time with the patient, reviewing the patient's chart, communicating with other professionals, and coordinating care. Greater than 50% of this time was spent in counseling or coordinating care with the patient regarding goals of hospitalization, psycho-education, and discharge planning needs.  Psychiatric Specialty Exam:   Blood pressure 113/68, pulse 91, temperature 98 F (36.7 C), temperature source Oral, resp. rate 16, height 5\' 1"  (1.549 m), weight 50.8 kg, SpO2 100 %.Body mass index is 21.16 kg/m.  General Appearance: casually dressed, fair hygiene  Eye Contact:: Fair - frequently glances about the room  Speech:  speaks via interpreter - Dari  Volume:  Decreased  Mood:  Anxious and Dysphoric  Affect:  Flat  Thought Process:  Concrete, superficially goal directed  Orientation:  Disoriented to year, month, or city  Thought Content:  Delusions that she has been pregnant; does not appear to be responding to interna/external stimuli; appears paranoid on exam  Suicidal Thoughts:  Denied  Homicidal Thoughts:  Denied  Memory:  Recent;   Limited  Judgement:  Impaired  Insight:  Lacking  Psychomotor Activity:  Occasional rocking of body and head swaying with sitting up in bed but appears to clear at times when speaking  Concentration:  Fair  Recall:  Poor  Fund of Knowledge:Limited  Language: unable to assess - speaks with interpreter  Akathisia:  Negative  Assets:  Desire for Improvement Resilience Social Support  Sleep:  Number of Hours: 6.5  Cognition: Impaired,  Mild  ADL's:  fair   Mental Status Per Nursing Assessment::   On  Admission: delusions, and agitation  Demographic Factors:  Adolescent or young adult, unemployed, Refugee from [phone]  Loss Factors: Refugee from [place]   Historical Factors: Impulsivity  Risk Reduction Factors:   Sense of responsibility to family, Living with another person, especially a relative and Positive social support  Continued Clinical Symptoms:  Currently delusional  Cognitive Features That Contribute To Risk:  Reportedly 7th grade working knowledge by hx    Suicide Risk:  Mild:  Suicidal ideation of limited frequency, intensity, duration, and specificity.  There are no identifiable plans, no associated intent, mild dysphoria and related symptoms, good self-control (both objective and subjective assessment), few other risk factors, and identifiable protective factors, including available and accessible social support.   Plan Of Care/Follow-up recommendations:  Activity:  as tolerated Diet:  heart healthy Other:  Transfer to Hospitalist/Neuro service for continued medical w/u of current symptoms.   [place], MD, FAPA 08/03/2020, 7:01 PM

## 2020-08-03 NOTE — Progress Notes (Signed)
Psychoeducational Group Note  Date:  08/03/2020 Time:  2015 Group Topic/Focus:  wrap up group  Participation Level: Did Not Attend  Participation Quality:  Not Applicable  Affect:  Not Applicable  Cognitive:  Not Applicable  Insight:  Not Applicable  Engagement in Group: Not Applicable  Additional Comments:  Pt was in her room during wrap up group   Marcille Buffy 08/03/2020, 8:43 PM

## 2020-08-03 NOTE — BHH Group Notes (Signed)
Psychoeducational Group Note  Date: 08-03-2020 Time: 0900-1000    Goal Setting   Purpose of Group: This group helps to provide patients with the steps of setting a goal that is specific, measurable, attainable, realistic and time specific. A discussion on how we keep ourselves stuck with negative self talk.    Participation Level:  Did not attend   Janalyn Higby A  

## 2020-08-03 NOTE — Progress Notes (Signed)
   08/03/20 1600  Psych Admission Type (Psych Patients Only)  Admission Status Involuntary  Psychosocial Assessment  Patient Complaints None  Eye Contact Fair  Facial Expression Anxious;Pensive;Worried  Affect Anxious;Apprehensive;Preoccupied  Speech Language other than English (Speaks Dari)  Interaction Assertive  Motor Activity Slow  Appearance/Hygiene Unremarkable  Behavior Characteristics Cooperative;Anxious;Calm  Mood Anxious;Sad  Thought Process  Coherency Circumstantial  Content Preoccupation  Delusions Somatic (believes she is pregnant)  Perception UTA  Hallucination None reported or observed  Judgment WDL  Confusion None  Danger to Self  Current suicidal ideation? Denies  Danger to Others  Danger to Others None reported or observed

## 2020-08-03 NOTE — Progress Notes (Signed)
Meet with patient's mother, Family Sponsor, Engineer, technical sales, and Neurologist Dr. Clydene Pugh. Discussed Neuro findings and plan to transfer patient to [facility] for further Neuro work up. Meeting took approximately 60 minutes.

## 2020-08-03 NOTE — Progress Notes (Addendum)
Received pt via carelink, reported by the [facility] RN that Pt's daytime interpreter is Mahin, [phone]. Admissions paged regarding pt's arrival and awaiting MD orders. Admission requirements could not be completed due to language barrier

## 2020-08-03 NOTE — Progress Notes (Signed)
Pt transferred to [facility] at 2130 via [facility].  Per dayshift family of patient made aware and patient aware of reason to be transferred.  Interpreter unavailable at time of transfer.  Pt pleasant, appears somewhat confused by process.  All belongings and discharge information sent with patient.  AC spoke with [facility] to arrange transport, report called by this staff member to [facility].  Pt will be going to room #39.

## 2020-08-03 NOTE — Progress Notes (Addendum)
[facility] MD Progress Note  08/03/2020 7:07 AM Sara Moon  MRN:  [id number] Subjective:   Sara Moon is a 20 yr old female who presents with acute psychosis. PPHx is significant for being a recent refugee from [place], seizure like activity, and behavioral issues with family.  During interview a Farsi interpreter was used because a dedicated Armed forces training and education officer was not available.  She reports that she does no think she currently has a baby in her body. However, when asked why she is in the hospital she states that her baby was sick and so came here for help. When told that she was not pregnant she does not accept this. She reports she does not know the last time she showered. She states she does not know the month, she does not know the year, and she does not know where she is. She reports no SI, HI, or AVH.  Lab work shows that A1C is 5.3 WNL. CBC, CMP, and Lipid Panel are all WNL. TSH is 0.406 WNL.  Principal Problem: Psychotic disorder (HCC) Diagnosis: Principal Problem:   Psychotic disorder (HCC) Active Problems:   Seizure disorder (HCC)  Total Time spent with patient: 30 minutes  Past Psychiatric History: Denies any  Past Medical History:  Past Medical History:  Diagnosis Date  . Concern for seizure disorder (HCC)   . Depression     Past Surgical History:  Procedure Laterality Date  . APPENDECTOMY  2008   Family History: History reviewed. No pertinent family history. Family Psychiatric  History: She reports none Social History:  Social History   Substance and Sexual Activity  Alcohol Use Never     Social History   Substance and Sexual Activity  Drug Use Never    Social History   Socioeconomic History  . Marital status: Single    Spouse name: Not on file  . Number of children: 0  . Years of education: Not on file  . Highest education level: Not on file  Occupational History  . Not on file  Tobacco Use  . Smoking status: Never Smoker  . Smokeless tobacco:  Never Used  Vaping Use  . Vaping Use: Never used  Substance and Sexual Activity  . Alcohol use: Never  . Drug use: Never  . Sexual activity: Never    Birth control/protection: None  Other Topics Concern  . Not on file  Social History Narrative   Lives with mother and father and older brother in [place]. Sixth of seven children. Other siblings in refugee camps across [place] and [place] anticipate will be joining family soon. Currently taking English classes    Social Determinants of Health   Financial Resource Strain: Not on file  Food Insecurity: Not on file  Transportation Needs: Not on file  Physical Activity: Not on file  Stress: Not on file  Social Connections: Not on file   Additional Social History:                         Sleep: Good  Appetite:  Fair  Current Medications: Current Facility-Administered Medications  Medication Dose Route Frequency Provider Last Rate Last Admin  . acetaminophen (TYLENOL) tablet 650 mg  650 mg Oral Q6H PRN Rankin, Shuvon B, NP      . divalproex (DEPAKOTE) DR tablet 750 mg  750 mg Oral BID Lauro Franklin, MD   750 mg at 08/02/20 2220  . hydrOXYzine (ATARAX/VISTARIL) tablet 25 mg  25 mg Oral TID PRN  Rankin, Shuvon B, NP      . LORazepam (ATIVAN) tablet 2 mg  2 mg Oral Q6H PRN Lauro Franklin, MD   2 mg at 08/02/20 2221  . OLANZapine zydis (ZYPREXA) disintegrating tablet 5 mg  5 mg Oral BID Rankin, Shuvon B, NP   5 mg at 08/02/20 1756  . traZODone (DESYREL) tablet 50 mg  50 mg Oral QHS PRN Rankin, Shuvon B, NP   50 mg at 08/01/20 2126    Lab Results:  Results for orders placed or performed during the hospital encounter of 07/31/20 (from the past 48 hour(s))  Comprehensive metabolic panel     Status: Abnormal   Collection Time: 08/02/20  6:05 PM  Result Value Ref Range   Sodium 141 135 - 145 mmol/L   Potassium 3.9 3.5 - 5.1 mmol/L   Chloride 106 98 - 111 mmol/L   CO2 24 22 - 32 mmol/L   Glucose, Bld 112 (H) 70 - 99  mg/dL    Comment: Glucose reference range applies only to samples taken after fasting for at least 8 hours.   BUN 16 6 - 20 mg/dL   Creatinine, Ser 8.18 0.44 - 1.00 mg/dL   Calcium 9.1 8.9 - 59.0 mg/dL   Total Protein 7.3 6.5 - 8.1 g/dL   Albumin 4.4 3.5 - 5.0 g/dL   AST 15 15 - 41 U/L   ALT 12 0 - 44 U/L   Alkaline Phosphatase 50 38 - 126 U/L   Total Bilirubin 0.6 0.3 - 1.2 mg/dL   GFR, Estimated >93 >11 mL/min    Comment: (NOTE) Calculated using the CKD-EPI Creatinine Equation (2021)    Anion gap 11 5 - 15    Comment: Performed at [facility], [address] [place], [place] [address]  CBC with Differential/Platelet     Status: None   Collection Time: 08/02/20  6:05 PM  Result Value Ref Range   WBC 5.9 4.0 - 10.5 K/uL   RBC 4.47 3.87 - 5.11 MIL/uL   Hemoglobin 12.7 12.0 - 15.0 g/dL   HCT 46.9 50.7 - 22.5 %   MCV 88.8 80.0 - 100.0 fL   MCH 28.4 26.0 - 34.0 pg   MCHC 32.0 30.0 - 36.0 g/dL   RDW 75.0 51.8 - 33.5 %   Platelets 319 150 - 400 K/uL   nRBC 0.0 0.0 - 0.2 %   Neutrophils Relative % 59 %   Neutro Abs 3.5 1.7 - 7.7 K/uL   Lymphocytes Relative 29 %   Lymphs Abs 1.7 0.7 - 4.0 K/uL   Monocytes Relative 7 %   Monocytes Absolute 0.4 0.1 - 1.0 K/uL   Eosinophils Relative 3 %   Eosinophils Absolute 0.2 0.0 - 0.5 K/uL   Basophils Relative 1 %   Basophils Absolute 0.1 0.0 - 0.1 K/uL   Immature Granulocytes 1 %   Abs Immature Granulocytes 0.04 0.00 - 0.07 K/uL    Comment: Performed at [facility], [address] [place], [place] [address]  Hemoglobin A1c     Status: None   Collection Time: 08/02/20  6:05 PM  Result Value Ref Range   Hgb A1c MFr Bld 5.3 4.8 - 5.6 %    Comment: (NOTE) Pre diabetes:          5.7%-6.4%  Diabetes:              >6.4%  Glycemic control for   <7.0% adults with diabetes  Mean Plasma Glucose 105.41 mg/dL    Comment: Performed at [facility], [address] [place], [place] [address]  TSH      Status: None   Collection Time: 08/02/20  6:05 PM  Result Value Ref Range   TSH 0.406 0.350 - 4.500 uIU/mL    Comment: Performed by a 3rd Generation assay with a functional sensitivity of <=0.01 uIU/mL. Performed at [facility], [address], [place], [place] [address]   Lipid panel     Status: None   Collection Time: 08/02/20  6:05 PM  Result Value Ref Range   Cholesterol 152 0 - 200 mg/dL   Triglycerides 64 <009 mg/dL   HDL 54 >38 mg/dL   Total CHOL/HDL Ratio 2.8 RATIO   VLDL 13 0 - 40 mg/dL   LDL Cholesterol 85 0 - 99 mg/dL    Comment:        Total Cholesterol/HDL:CHD Risk Coronary Heart Disease Risk Table                     Men   Women  1/2 Average Risk   3.4   3.3  Average Risk       5.0   4.4  2 X Average Risk   9.6   7.1  3 X Average Risk  23.4   11.0        Use the calculated Patient Ratio above and the CHD Risk Table to determine the patient's CHD Risk.        ATP III CLASSIFICATION (LDL):  <100     mg/dL   Optimal  182-993  mg/dL   Near or Above                    Optimal  130-159  mg/dL   Borderline  716-967  mg/dL   High  >893     mg/dL   Very High Performed at [facility], [address] [place], [place] [address]     Blood Alcohol level:  Lab Results  Component Value Date   ETH <10 07/25/2020    Metabolic Disorder Labs: Lab Results  Component Value Date   HGBA1C 5.3 08/02/2020   MPG 105.41 08/02/2020   No results found for: PROLACTIN Lab Results  Component Value Date   CHOL 152 08/02/2020   TRIG 64 08/02/2020   HDL 54 08/02/2020   CHOLHDL 2.8 08/02/2020   VLDL 13 08/02/2020   LDLCALC 85 08/02/2020    Physical Findings: AIMS: Facial and Oral Movements Muscles of Facial Expression: None, normal Lips and Perioral Area: None, normal Jaw: None, normal Tongue: None, normal,Extremity Movements Upper (arms, wrists, hands, fingers): None, normal Lower (legs, knees, ankles, toes): None, normal,  Trunk Movements Neck, shoulders, hips: None, normal, Overall Severity Severity of abnormal movements (highest score from questions above): None, normal Incapacitation due to abnormal movements: None, normal Patient's awareness of abnormal movements (rate only patient's report): No Awareness, Dental Status Current problems with teeth and/or dentures?: No Does patient usually wear dentures?: No  CIWA:    COWS:     Musculoskeletal: Strength & Muscle Tone: within normal limits Gait & Station: normal Patient leans: N/A  Psychiatric Specialty Exam: Physical Exam Vitals and nursing note reviewed.  Constitutional:      General: She is not in acute distress.    Appearance: Normal appearance. She is normal weight. She is not ill-appearing, toxic-appearing or diaphoretic.  HENT:  Head: Normocephalic and atraumatic.  Cardiovascular:     Rate and Rhythm: Normal rate.  Pulmonary:     Effort: Pulmonary effort is normal.  Neurological:     Mental Status: She is alert.     Review of Systems  Constitutional: Negative for fever.  Cardiovascular: Negative for chest pain.  Gastrointestinal: Negative for abdominal pain and nausea.  Neurological: Negative for headaches.  Psychiatric/Behavioral: Negative for suicidal ideas.    Blood pressure 113/68, pulse 91, temperature 98 F (36.7 C), temperature source Oral, resp. rate 16, height 5\' 1"  (1.549 m), weight 50.8 kg, SpO2 100 %.Body mass index is 21.16 kg/m.  General Appearance: Casual and Disheveled  Eye Contact:  Good  Speech:  Clear and Coherent and Normal Rate  Volume:  Normal  Mood:  Dysphoric  Affect:  Flat  Thought Process:  Coherent  Orientation:  Other:  States she does not know the year, month, or where she is  Thought Content:  Delusions  Suicidal Thoughts:  No  Homicidal Thoughts:  No  Memory:  Immediate;   Fair Recent;   Fair  Judgement:  Impaired  Insight:  Shallow  Psychomotor Activity:  Normal  Concentration:   Concentration: Fair and Attention Span: Fair  Recall:  FiservFair  Fund of Knowledge:  Fair  Language:  Fair  Akathisia:  Negative  Handed:  Right  AIMS (if indicated):     Assets:  Manufacturing systems engineerCommunication Skills Physical Health Resilience Social Support  ADL's:  Impaired  Cognition:  WNL  Sleep:  Number of Hours: 6.5     Treatment Plan Summary: Daily contact with patient to assess and evaluate symptoms and progress in treatment  Sara Bonney AidHamdard is a 20 yr old female who presents with acute psychosis. PPHx is significant for being a recent refugee from [place], seizure like activity, and behavioral issues with family.   MRI Brain W Contrast shows No contrast-enhancing lesion. Unchanged nonspecific foci of hyperintense T2-weighted signal. Cervical Spine MRI results Negative for demyelinating disease. She has improved some in that she does not think she is pregnant but does not know where she or why she is there. Neuro did come to see her so will await their recommendations. Because of her sedation will consolidate her medications to QHS.   Psychotic Disorder, Unspecified: -Change Zyprexa to 10 mg QHS   Seizures/Epilepsy: -Change Depakote to Depakote ER 1000 mg QHS -Draw Depakote level on Monday morning -Start Ativan 2 mg PRN seizures   -Continue PRN's: Tylenol, Atarax, Trazodone   Lauro FranklinAlexander S Bertine Schlottman, MD 08/03/2020, 7:07 AM

## 2020-08-04 ENCOUNTER — Inpatient Hospital Stay (HOSPITAL_COMMUNITY): Payer: Medicaid Other

## 2020-08-04 ENCOUNTER — Encounter (HOSPITAL_COMMUNITY): Payer: Self-pay | Admitting: Internal Medicine

## 2020-08-04 ENCOUNTER — Telehealth: Payer: Self-pay

## 2020-08-04 DIAGNOSIS — E538 Deficiency of other specified B group vitamins: Secondary | ICD-10-CM

## 2020-08-04 DIAGNOSIS — F09 Unspecified mental disorder due to known physiological condition: Secondary | ICD-10-CM

## 2020-08-04 DIAGNOSIS — Z79899 Other long term (current) drug therapy: Secondary | ICD-10-CM | POA: Diagnosis not present

## 2020-08-04 DIAGNOSIS — R32 Unspecified urinary incontinence: Secondary | ICD-10-CM | POA: Diagnosis present

## 2020-08-04 DIAGNOSIS — G9340 Encephalopathy, unspecified: Secondary | ICD-10-CM | POA: Diagnosis present

## 2020-08-04 DIAGNOSIS — R45851 Suicidal ideations: Secondary | ICD-10-CM | POA: Diagnosis present

## 2020-08-04 DIAGNOSIS — F22 Delusional disorders: Secondary | ICD-10-CM | POA: Diagnosis present

## 2020-08-04 DIAGNOSIS — Z20822 Contact with and (suspected) exposure to covid-19: Secondary | ICD-10-CM | POA: Diagnosis present

## 2020-08-04 DIAGNOSIS — Z9049 Acquired absence of other specified parts of digestive tract: Secondary | ICD-10-CM | POA: Diagnosis not present

## 2020-08-04 DIAGNOSIS — F32A Depression, unspecified: Secondary | ICD-10-CM | POA: Diagnosis present

## 2020-08-04 DIAGNOSIS — G40909 Epilepsy, unspecified, not intractable, without status epilepticus: Secondary | ICD-10-CM | POA: Diagnosis present

## 2020-08-04 LAB — CBC WITH DIFFERENTIAL/PLATELET
Abs Immature Granulocytes: 0.04 10*3/uL (ref 0.00–0.07)
Basophils Absolute: 0.1 10*3/uL (ref 0.0–0.1)
Basophils Relative: 1 %
Eosinophils Absolute: 0.2 10*3/uL (ref 0.0–0.5)
Eosinophils Relative: 3 %
HCT: 36.3 % (ref 36.0–46.0)
Hemoglobin: 12.2 g/dL (ref 12.0–15.0)
Immature Granulocytes: 1 %
Lymphocytes Relative: 29 %
Lymphs Abs: 1.9 10*3/uL (ref 0.7–4.0)
MCH: 28.6 pg (ref 26.0–34.0)
MCHC: 33.6 g/dL (ref 30.0–36.0)
MCV: 85 fL (ref 80.0–100.0)
Monocytes Absolute: 0.6 10*3/uL (ref 0.1–1.0)
Monocytes Relative: 9 %
Neutro Abs: 3.7 10*3/uL (ref 1.7–7.7)
Neutrophils Relative %: 57 %
Platelets: 303 10*3/uL (ref 150–400)
RBC: 4.27 MIL/uL (ref 3.87–5.11)
RDW: 14 % (ref 11.5–15.5)
WBC: 6.5 10*3/uL (ref 4.0–10.5)
nRBC: 0 % (ref 0.0–0.2)

## 2020-08-04 LAB — COMPREHENSIVE METABOLIC PANEL
ALT: 12 U/L (ref 0–44)
AST: 14 U/L — ABNORMAL LOW (ref 15–41)
Albumin: 3.6 g/dL (ref 3.5–5.0)
Alkaline Phosphatase: 35 U/L — ABNORMAL LOW (ref 38–126)
Anion gap: 10 (ref 5–15)
BUN: 11 mg/dL (ref 6–20)
CO2: 23 mmol/L (ref 22–32)
Calcium: 9 mg/dL (ref 8.9–10.3)
Chloride: 108 mmol/L (ref 98–111)
Creatinine, Ser: 0.45 mg/dL (ref 0.44–1.00)
GFR, Estimated: >60 mL/min (ref >60)
Glucose, Bld: 79 mg/dL (ref 70–99)
Potassium: 3.7 mmol/L (ref 3.5–5.1)
Sodium: 141 mmol/L (ref 135–145)
Total Bilirubin: 0.3 mg/dL (ref 0.3–1.2)
Total Protein: 5.8 g/dL — ABNORMAL LOW (ref 6.5–8.1)

## 2020-08-04 LAB — SEDIMENTATION RATE: Sed Rate: 3 mm/hr (ref 0–22)

## 2020-08-04 LAB — HIV ANTIBODY (ROUTINE TESTING W REFLEX): HIV Screen 4th Generation wRfx: NONREACTIVE

## 2020-08-04 LAB — PHOSPHORUS: Phosphorus: 3.8 mg/dL (ref 2.5–4.6)

## 2020-08-04 LAB — MAGNESIUM: Magnesium: 1.8 mg/dL (ref 1.7–2.4)

## 2020-08-04 LAB — VITAMIN B12: Vitamin B-12: 169 pg/mL — ABNORMAL LOW (ref 180–914)

## 2020-08-04 LAB — RPR: RPR Ser Ql: NONREACTIVE

## 2020-08-04 LAB — C-REACTIVE PROTEIN: CRP: 0.6 mg/dL (ref <1.0)

## 2020-08-04 LAB — PREALBUMIN: Prealbumin: 18.8 mg/dL (ref 18–38)

## 2020-08-04 LAB — FOLATE: Folate: 7.9 ng/mL (ref >5.9)

## 2020-08-04 MED ORDER — THIAMINE HCL 100 MG PO TABS
100.0000 mg | ORAL_TABLET | Freq: Every day | ORAL | Status: DC
  Administered 2020-08-04 – 2020-08-05 (×2): 100 mg via ORAL
  Filled 2020-08-04 (×2): qty 1

## 2020-08-04 MED ORDER — CYANOCOBALAMIN 1000 MCG/ML IJ SOLN: 1000.0000 ug | Freq: Once | INTRAMUSCULAR | Status: DC

## 2020-08-04 MED ORDER — CYANOCOBALAMIN 1000 MCG/ML IJ SOLN
1000.0000 ug | INTRAMUSCULAR | Status: DC
  Administered 2020-08-04: 1000 ug via INTRAMUSCULAR
  Filled 2020-08-04: qty 1

## 2020-08-04 MED ORDER — SODIUM CHLORIDE 0.9 % IV BOLUS
500.0000 mL | Freq: Once | INTRAVENOUS | Status: AC
  Administered 2020-08-04: 500 mL via INTRAVENOUS

## 2020-08-04 NOTE — Progress Notes (Signed)
vLTM EEG started. Notified neuro.  

## 2020-08-04 NOTE — Progress Notes (Signed)
  [facility] Adult Case Management Discharge Plan :  Will you be returning to the same living situation after discharge:  No.  Transferred to medical floor for neurological work-up At discharge, do you have transportation home?: Yes,  when discharges home, sponsor can transport Do you have the ability to pay for your medications: Yes,  Medicaid  Release of information consent forms will need to be completed when discharge plan is determined.  Patient to Follow up at:  TBD - has discharged from [facility] to medical floor   Next level of care provider has access to [facility] Link:yes  Safety Planning and Suicide Prevention discussed: No.     Has patient been referred to the Quitline?: N/A patient is not a smoker  Patient has been referred for addiction treatment: N/A  Lynnell Chad, LCSW 08/04/2020, 11:52 AM

## 2020-08-04 NOTE — Progress Notes (Signed)
Neurology Progress Note  S: No events overnight. No seizure like activity reported by patient or RN. Patient says she is doing fine and is hungry.   Interpretor used for exam.  O: Current vital signs: BP (!) 100/55 (BP Location: Left Arm)   Pulse 62   Temp 98 F (36.7 C) (Oral)   Resp 18   SpO2 99%  Vital signs in last 24 hours: Temp:  [98 F (36.7 C)-98.9 F (37.2 C)] 98 F (36.7 C) (02/20 0651) Pulse Rate:  [60-86] 62 (02/20 0652) Resp:  [16-20] 18 (02/20 0651) BP: (87-108)/(40-69) 100/55 (02/20 0651) SpO2:  [99 %-100 %] 99 % (02/20 0651)  GENERAL: Awake, alert in NAD. She is smiling today. She continues to move her head back and forth, which seems to be purposeful and stops on occasion. HEENT: Normocephalic and atraumatic LUNGS: Normal respiratory effort.  CV: RRR ABDOMEN: Soft, nontender Ext: warm   NEURO:  Mental Status: AA&Ox3  Speech/Language: speech is without dysarthria.  Naming, repetition, fluency, and comprehension intact.  Cranial Nerves:  II: PERRL. Visual fields full.  III, IV, VI: EOMI. Eyelids elevate symmetrically.  V: Sensation is intact to light touch and symmetrical to face.  VII: Smile is symmetrical.  VIII: hearing intact to voice. IX, X: Palate elevates symmetrically. Phonation is normal.  UU:[id number] shrug 5/5. XII: tongue is midline without fasciculations. Motor: 5/5 strength to all muscle groups tested.  Tone: is normal and bulk is normal Sensation- Intact to light touch bilaterally. Extinction absent to light touch DSS.   Coordination: FTN intact bilaterally. No drift.  DTRs: Areflexic in BLEs and hyporeflexic in BUEs Gait- steady with normal step and arm swing.   Medications  Current Facility-Administered Medications:  .  acetaminophen (TYLENOL) tablet 650 mg, 650 mg, Oral, Q6H PRN **OR** acetaminophen (TYLENOL) suppository 650 mg, 650 mg, Rectal, Q6H PRN, Doutova, Anastassia, MD .  cyanocobalamin ((VITAMIN B-12)) injection 1,000  mcg, 1,000 mcg, Subcutaneous, Once, Rizwan, Saima, MD .  divalproex (DEPAKOTE) DR tablet 1,000 mg, 1,000 mg, Oral, QHS, Doutova, Anastassia, MD  Pertinent Labs: Vit B12 169            A1c 5.3        TSH 0.406  Imaging  MRI Brain  No contrast-enhancing lesion. Unchanged nonspecific foci of hyperintense T2-weighted signal.  Assessment: 20 yo female who was having odd behavior at home and was admitted to [facility] with psychosis. She also voiced desire for her family to die as well as herself. See yesterday's note for details. She is smiling today and participates with questions and exam. Her seizure like activity was related by mother to have started at age 44. Depakote was restarted at [facility]. Due to the need for EEG and more complicated lab studies, she was transferred to [facility].   Plan:  1. EEG 2. Labs include full peripheral neuropathy screen, nutritional screen, autoimmune disorders, etc. Copper and ceruloplasmin are pending.  3. CSF testing for MS profile and other infectious, autoimmune disorders.  4. Urine for heavy metals, porphyrins 5. Vitamin B12 low, will replete.    Pt seen by Jimmye Norman, MSN, APN-BC/Nurse Practitioner/Neuro and later by MD. Note and plan to be edited as needed by MD.  Pager: [phone]

## 2020-08-04 NOTE — Procedures (Signed)
Patient Name: Sara Moon  MRN: [id number]  Epilepsy Attending: Charlsie Quest  Referring Physician/Provider: Dr Merri Ray Date: 08/04/2020 Duration: 24.01 mins  Patient history: 19yo F with ams and psychosis. EEG to evaluate for seizure  Level of alertness: Awake, asleep  AEDs during EEG study: VPA  Technical aspects: This EEG study was done with scalp electrodes positioned according to the 10-20 International system of electrode placement. Electrical activity was acquired at a sampling rate of 500Hz  and reviewed with a high frequency filter of 70Hz  and a low frequency filter of 1Hz . EEG data were recorded continuously and digitally stored.   Description: No posterior dominant rhythm was seen. Sleep was characterized by vertex waves, sleep spindles (12 to 14 Hz), maximal frontocentral region.  EEG showed continuous generalized 3 to 6 Hz theta-delta slowing. Independent spikes were seen in left and right frontotemporal region. Hyperventilation and photic stimulation were not performed.     ABNORMALITY -Continuous slow, generalized -Spike, left and right frontotemporal region   IMPRESSION: This study showed evidence of independent epileptogenicity arising   from left and right frontotemporal region as well as moderate diffuse encephalopathy, nonspecific etiology. No seizures were seen throughout the recording.  Beatryce Colombo 

## 2020-08-04 NOTE — Consult Note (Signed)
20 yo client transferred to the medical floor from [facility] last evening.  This provider went to complete consult approximately 12:30 pm and EEG tech requested to give her 15 minutes.  Spoke with her RN who reported she was initially reluctant to care but resolved and calm this morning.  Left to complete other consults, returned 20 minutes later and she requested more time.  Wally interpreter machine moved and in use in another room.  Spoke with the RN again who reported it would be awhile, explained follow up would occur tomorrow as other consults at [place] were in need to be completed.  She stated understanding and nothing needed on her end or the clients.  Nanine Means, PMHNP

## 2020-08-04 NOTE — Telephone Encounter (Signed)
Patient sponsor contacted me with information that Ms Sara Moon has been transfered to [facility] [phone]. Patient mother would like to stay over night for support. I have spoken with nurse Lurena Joiner taking care of patient today. Unfortunately the patient does not qualify for unrestricted visitation.Patient mother informed. I will continue to offer support as needed.  [name] RN BSn PCCN  [facility] Nurse [phone]-cell [phone]-office

## 2020-08-04 NOTE — Progress Notes (Signed)
EEG completed, results pending. 

## 2020-08-04 NOTE — Progress Notes (Signed)
PROGRESS NOTE    Sara Moon   FAO:[id number]  DOB: Aug 02, 2000  DOA: 08/03/2020 PCP: Iona Beard, MD   Brief Narrative:  Sara Moon is a 20  y/o [place] who presents to the ED for months of progressive depression, confusion and delusions. At this point she believes she is pregnant, does not know who the father is and is asking for an abortion. Family notes episodes of unresponsiveness with rhythmic shaking. She was admitted to be evaluated for seizures.    Subjective: She has no complaints but asks me what is being done to help her have an abortion. She knows she is in [place] and in the hospital but when asked what month or year it is, she states she does not know. She does not know her age either when asked. She is not aware of how she may have gotten pregnant and does not know how long she has been pregnant for.     Assessment & Plan:   Principal Problem:   Psychotic disorder  - needs ongoing psychiatric and psychological care - TSH normal - ESR normal - UDS + for benzodiazepines - HCG negative- patient is not pregnant as she claims to be  Active Problems:  Possible seizure disorder  - awaiting EEG - MRI unrevealing  - appreciate neurology work up  B12 deficiency - B12 level is 169-- oddly, there is no macrocytosis or pancytopenia ( which I often see in B12 deficiency)  - I have ordered s/c B12 today - Will check level again tomorrow - Will order MMA level   Time spent in minutes: 35 DVT prophylaxis: SCDs Start: 08/03/20 2302  Code Status: Full code Family Communication:  Level of Care: Level of care: Telemetry Medical Disposition Plan:  Status is: Observation  The patient will require care spanning > 2 midnights and should be moved to inpatient because: Inpatient level of care appropriate due to severity of illness  Dispo: The patient is from: Home              Anticipated d/c is to: TBD              Anticipated d/c date is: 2 days               Patient currently is not medically stable to d/c.   Difficult to place patient No      Consultants:   Neurology  Psychiatry  Procedures:   EEG  Antimicrobials:  Anti-infectives (From admission, onward)   None       Objective: Vitals:   08/04/20 0458 08/04/20 0502 08/04/20 0651 08/04/20 0652  BP: (!) 87/40 (!) 96/56 (!) 100/55   Pulse: 60  68 62  Resp: 16  18   Temp: 98.5 F (36.9 C)  98 F (36.7 C)   TempSrc: Oral  Oral   SpO2: 100%  99%    No intake or output data in the 24 hours ending 08/04/20 1409 There were no vitals filed for this visit.  Examination: General exam: Appears comfortable  HEENT: PERRLA, oral mucosa moist, no sclera icterus or thrush Respiratory system: Clear to auscultation. Respiratory effort normal. Cardiovascular system: S1 & S2 heard, RRR.   Gastrointestinal system: Abdomen soft, non-tender, nondistended. Normal bowel sounds. Central nervous system: Alert and oriented to place and person . No focal neurological deficits. Extremities: No cyanosis, clubbing or edema Skin: No rashes or ulcers Psychiatry:  Appears anxious- rocking side to side- flat affect    Data Reviewed: I  have personally reviewed following labs and imaging studies  CBC: Recent Labs  Lab 08/02/20 1805 08/04/20 0427  WBC 5.9 6.5  NEUTROABS 3.5 3.7  HGB 12.7 12.2  HCT 39.7 36.3  MCV 88.8 85.0  PLT 319 947   Basic Metabolic Panel: Recent Labs  Lab 08/02/20 1805 08/04/20 0427  NA 141 141  K 3.9 3.7  CL 106 108  CO2 24 23  GLUCOSE 112* 79  BUN 16 11  CREATININE 0.87 0.45  CALCIUM 9.1 9.0  MG  --  1.8  PHOS  --  3.8   GFR: Estimated Creatinine Clearance: 83.9 mL/min (by C-G formula based on SCr of 0.45 mg/dL). Liver Function Tests: Recent Labs  Lab 08/02/20 1805 08/04/20 0427  AST 15 14*  ALT 12 12  ALKPHOS 50 35*  BILITOT 0.6 0.3  PROT 7.3 5.8*  ALBUMIN 4.4 3.6   No results for input(s): LIPASE, AMYLASE in the last 168 hours. No results  for input(s): AMMONIA in the last 168 hours. Coagulation Profile: No results for input(s): INR, PROTIME in the last 168 hours. Cardiac Enzymes: No results for input(s): CKTOTAL, CKMB, CKMBINDEX, TROPONINI in the last 168 hours. BNP (last 3 results) No results for input(s): PROBNP in the last 8760 hours. HbA1C: Recent Labs    08/02/20 1805  HGBA1C 5.3   CBG: No results for input(s): GLUCAP in the last 168 hours. Lipid Profile: Recent Labs    08/02/20 1805  CHOL 152  HDL 54  LDLCALC 85  TRIG 64  CHOLHDL 2.8   Thyroid Function Tests: Recent Labs    08/02/20 1805  TSH 0.406   Anemia Panel: Recent Labs    08/04/20 0427  VITAMINB12 169*  FOLATE 7.9   Urine analysis: No results found for: COLORURINE, APPEARANCEUR, LABSPEC, PHURINE, GLUCOSEU, HGBUR, BILIRUBINUR, KETONESUR, PROTEINUR, UROBILINOGEN, NITRITE, LEUKOCYTESUR Sepsis Labs: _0 (procalcitonin:4,lacticidven:4) ) Recent Results (from the past 240 hour(s))  Resp Panel by RT-PCR (Flu A&B, Covid) Nasopharyngeal Swab     Status: None   Collection Time: 07/31/20 10:28 AM   Specimen: Nasopharyngeal Swab; Nasopharyngeal(NP) swabs in vial transport medium  Result Value Ref Range Status   SARS Coronavirus 2 by RT PCR NEGATIVE NEGATIVE Final    Comment: (NOTE) SARS-CoV-2 target nucleic acids are NOT DETECTED.  The SARS-CoV-2 RNA is generally detectable in upper respiratory specimens during the acute phase of infection. The lowest concentration of SARS-CoV-2 viral copies this assay can detect is 138 copies/mL. A negative result does not preclude SARS-Cov-2 infection and should not be used as the sole basis for treatment or other patient management decisions. A negative result may occur with  improper specimen collection/handling, submission of specimen other than nasopharyngeal swab, presence of viral mutation(s) within the areas targeted by this assay, and inadequate number of viral copies(<138 copies/mL). A  negative result must be combined with clinical observations, patient history, and epidemiological information. The expected result is Negative.  Fact Sheet for Patients:  EntrepreneurPulse.com.au  Fact Sheet for Healthcare Providers:  IncredibleEmployment.be  This test is no t yet approved or cleared by the [place] FDA and  has been authorized for detection and/or diagnosis of SARS-CoV-2 by FDA under an Emergency Use Authorization (EUA). This EUA will remain  in effect (meaning this test can be used) for the duration of the COVID-19 declaration under Section 564(b)(1) of the Act, 21 U.S.C.section 360bbb-3(b)(1), unless the authorization is terminated  or revoked sooner.       Influenza A by PCR NEGATIVE NEGATIVE  Final   Influenza B by PCR NEGATIVE NEGATIVE Final    Comment: (NOTE) The Xpert Xpress SARS-CoV-2/FLU/RSV plus assay is intended as an aid in the diagnosis of influenza from Nasopharyngeal swab specimens and should not be used as a sole basis for treatment. Nasal washings and aspirates are unacceptable for Xpert Xpress SARS-CoV-2/FLU/RSV testing.  Fact Sheet for Patients: EntrepreneurPulse.com.au  Fact Sheet for Healthcare Providers: IncredibleEmployment.be  This test is not yet approved or cleared by the [place] FDA and has been authorized for detection and/or diagnosis of SARS-CoV-2 by FDA under an Emergency Use Authorization (EUA). This EUA will remain in effect (meaning this test can be used) for the duration of the COVID-19 declaration under Section 564(b)(1) of the Act, 21 U.S.C. section 360bbb-3(b)(1), unless the authorization is terminated or revoked.  Performed at [facility], [place] [address], [place],  [address]          Radiology Studies: MR BRAIN W CONTRAST  Result Date: 08/03/2020 CLINICAL DATA:  Encephalopathy. Follow-up of MRI  without contrast done 07/25/2020. EXAM: MRI HEAD WITH CONTRAST TECHNIQUE: Multiplanar, multiecho pulse sequences of the brain and surrounding structures were obtained with intravenous contrast. CONTRAST:  5.74m GADAVIST GADOBUTROL 1 MMOL/ML IV SOLN COMPARISON:  None. FINDINGS: There is no abnormal contrast enhancement. Nonspecific foci of hyperintense T2-weighted signal within the periventricular white matter are unchanged. Benign arachnoid cyst adjacent to the right frontal operculum. IMPRESSION: No contrast-enhancing lesion. Unchanged nonspecific foci of hyperintense T2-weighted signal. Electronically Signed   By: KUlyses JarredM.D.   On: 08/03/2020 03:03   MR CERVICAL SPINE W WO CONTRAST  Result Date: 08/03/2020 CLINICAL DATA:  Encephalopathy.  Question demyelinating disease. EXAM: MRI CERVICAL SPINE WITHOUT AND WITH CONTRAST TECHNIQUE: Multiplanar and multiecho pulse sequences of the cervical spine, to include the craniocervical junction and cervicothoracic junction, were obtained without and with intravenous contrast. CONTRAST:  5.5 mL GADAVIST IV SOLN COMPARISON:  Brain MRI this same day. FINDINGS: Alignment: Normal. Vertebrae: No fracture, evidence of discitis, or bone lesion. Cord: Normal signal and morphology. No pathologic enhancement after contrast administration. Posterior Fossa, vertebral arteries, paraspinal tissues: Negative. Disc levels: Disc height and hydration are maintained at all levels. The central canal and foramina are widely patent throughout. IMPRESSION: Negative for demyelinating disease.  Normal cervical spine MRI. Electronically Signed   By: TInge RiseM.D.   On: 08/03/2020 11:18      Scheduled Meds: . cyanocobalamin  1,000 mcg Intramuscular Weekly  . divalproex  1,000 mg Oral QHS  . thiamine  100 mg Oral Daily   Continuous Infusions:   LOS: 1 day      SDebbe Odea MD [facility] Pager: [email or web address] 08/04/2020, 2:09 PM

## 2020-08-05 ENCOUNTER — Other Ambulatory Visit: Payer: Self-pay | Admitting: Internal Medicine

## 2020-08-05 DIAGNOSIS — F22 Delusional disorders: Secondary | ICD-10-CM

## 2020-08-05 LAB — LACTIC ACID, PLASMA: Lactic Acid, Venous: 1.9 mmol/L (ref 0.5–1.9)

## 2020-08-05 LAB — ANA W/REFLEX IF POSITIVE: Anti Nuclear Antibody (ANA): NEGATIVE

## 2020-08-05 MED ORDER — VITAMIN B-12 1000 MCG PO TABS: 1000.0000 ug | ORAL_TABLET | Freq: Every day | ORAL | 0 refills | Status: DC

## 2020-08-05 MED ORDER — ENSURE ENLIVE PO LIQD: 237.0000 mL | Freq: Two times a day (BID) | ORAL | Status: DC

## 2020-08-05 MED ORDER — ADULT MULTIVITAMIN W/MINERALS CH: 1.0000 | ORAL_TABLET | Freq: Every day | ORAL | 0 refills | Status: DC

## 2020-08-05 MED ORDER — ARIPIPRAZOLE 5 MG PO TABS: 5.0000 mg | ORAL_TABLET | Freq: Every day | ORAL | 0 refills | Status: DC

## 2020-08-05 MED ORDER — SODIUM CHLORIDE 0.9 % IV BOLUS
500.0000 mL | Freq: Once | INTRAVENOUS | Status: AC
  Administered 2020-08-05: 500 mL via INTRAVENOUS

## 2020-08-05 MED ORDER — ARIPIPRAZOLE 10 MG PO TABS: 5.0000 mg | ORAL_TABLET | Freq: Every day | ORAL | Status: DC

## 2020-08-05 MED ORDER — ARIPIPRAZOLE 2 MG PO TABS
2.0000 mg | ORAL_TABLET | Freq: Once | ORAL | Status: AC
  Administered 2020-08-05: 2 mg via ORAL
  Filled 2020-08-05: qty 1

## 2020-08-05 MED ORDER — ADULT MULTIVITAMIN W/MINERALS CH: 1.0000 | ORAL_TABLET | Freq: Every day | ORAL | Status: DC

## 2020-08-05 MED ORDER — DIVALPROEX SODIUM 500 MG PO DR TAB: 1000.0000 mg | DELAYED_RELEASE_TABLET | Freq: Every day | ORAL | 2 refills | Status: DC

## 2020-08-05 MED FILL — VITAMIN B-12 1000 MCG TABS: 1000 | 30 days supply | Qty: 30 | Fill #0

## 2020-08-05 MED FILL — ARIPiprazole 5 MG TABS: 5 | 30 days supply | Qty: 30 | Fill #0

## 2020-08-05 MED FILL — DIVALPROEX SOD DR 500 MG TA: 500 | 30 days supply | Qty: 60 | Fill #0

## 2020-08-05 NOTE — Progress Notes (Signed)
Subjective: No seizure-like episodes.  Patient's mother at bedside.  Reports patient has had seizure-like episodes since she was about 85 or 20 years old.  Describes that during these episodes, patient is nonverbal and has right arm flexed at the elbow with rhythmic twitching for about 2 to 3 minutes. She said at times there is also left arm twitching.  Patient can be confused for a few minutes and sometimes does not remember what happened. Sometimes after the episode, patient reports seeing a bright lights, denies any headache, warning signs. These episodes happen about 2-3 times a week.    They deny episodes of delusion like this current admission but states patient has had other episodes of poor behavior for example once she walked out of the shower without any clothes on when the whole family was sitting outside.  She also reports that at times patient states she cannot help her mother with food because the voices in her head telling her not to.  ROS: negative except above  Examination  Vital signs in last 24 hours: Temp:  [97.6 F (36.4 C)-98.6 F (37 C)] 98.6 F (37 C) (02/21 1200) Pulse Rate:  [63-104] 64 (02/21 0350) Resp:  [14-20] 14 (02/21 0350) BP: (78-110)/(43-76) 104/62 (02/21 0816) SpO2:  [97 %-99 %] 98 % (02/21 1200)  General: Sitting in bed, not in apparent distress CVS: pulse-normal rate and rhythm RS: breathing comfortably Extremities: normal, warm Psych: Appears calm, however has been shaking her head no -no for almost an hour that I was in the room.  Was able to stop when asked Neuro: AOx3, cranial nerves 2 to 12 grossly intact, follows all commands, able to name all objects, 5/5 in all 4 extremities.  Basic Metabolic Panel: Recent Labs  Lab 08/02/20 1805 08/04/20 0427  NA 141 141  K 3.9 3.7  CL 106 108  CO2 24 23  GLUCOSE 112* 79  BUN 16 11  CREATININE 0.87 0.45  CALCIUM 9.1 9.0  MG  --  1.8  PHOS  --  3.8    CBC: Recent Labs  Lab 08/02/20 1805  08/04/20 0427  WBC 5.9 6.5  NEUTROABS 3.5 3.7  HGB 12.7 12.2  HCT 39.7 36.3  MCV 88.8 85.0  PLT 319 303     Coagulation Studies: No results for input(s): LABPROT, INR in the last 72 hours.  Imaging MRI brain with contrast 08/03/2020: No contrast-enhancing lesion. Unchanged nonspecific foci of hyperintense T2-weighted signal.  MRI brain without contrast 07/25/2020: No acute brain finding. 2. Few small foci of T2 and FLAIR signal with the hemispheric white matter, most noticeable adjacent to the posterior body of the left lateral ventricle. These are nonspecific and could be chronic insults, even related to prenatal or perinatal insult. There is some potential that this could represent the earliest manifestation of demyelinating disease/multiple sclerosis. 3. 12 x 24 mm arachnoid cyst at the right posterior frontal convexity, not likely of any clinical significance.  MR cervical spine with and without contrast 08/03/2020: Negative for demyelinating disease.  Normal cervical spine MRI  ASSESSMENT AND PLAN: 20 year old female with seizure-like episodes admitted for new onset delusion.  Epilepsy -LTM EEG showed evidence of independent epileptogenicity arising from left and right frontotemporal region. -MRI brain with contrast did not show any enhancement. NMDA/autoimmune encephalitis was considered in differential.  However, it appears that there has not been any significant change in patient's seizure frequency.  Given normal MRI and no other no focal neurological deficits, I do not think  we need a lumbar puncture right now.  Recommendations -Patient has had seizure-like episodes for more than a decade and has been on antiseizure medications in the past but unable to continue them -Discussed the current EEG findings as well as the diagnosis of epilepsy, importance of taking medications with patient and her mother at bedside as well as patient's sister on phone -Recommend continuing  Depakote 1000 mg daily, discussed side effects in detail including but not limited to weight gain, thinning of hair, elevated liver enzymes, tremor, teratogenic potential. -Discussed that if patient is unable to tolerate his medication, to contact patient's primary neurologist Dr. Marjory Lies to consider switching to a different AED. -Discussed seizure precaution including do not drive -We will defer management of psych comorbidities to psychiatry team -Follow-up with Dr. Marjory Lies on 10/14/2020  I have spent a total of 60   minutes with the patient reviewing hospital notes,  test results, labs and examining the patient as well as establishing an assessment and plan that was discussed personally with the patient's mother and daughter.  > 50% of time was spent in direct patient care.   Lindie Spruce Epilepsy [facility] For questions after 5pm please refer to AMION to reach the Neurologist on call

## 2020-08-05 NOTE — Progress Notes (Signed)
Discharge instructions reviewed with patient and daughter at bedside. All questions asked and answered. Follow up appointments and medications reviewed. PIV removed. Patient being discharged home via personal transportation with mother. Cleared for DC.

## 2020-08-05 NOTE — Progress Notes (Signed)
Pt's BP was soft and on call MD was made aware, new orders given and implemented.

## 2020-08-05 NOTE — Procedures (Signed)
Patient Name: Sara Moon  MRN: [id number]  Epilepsy Attending: Charlsie Quest  Referring Physician/Provider: Dr Merri Ray Duration: 08/04/2020 1218 to 08/05/2020 1154  Patient history: 19yo F with ams and psychosis. EEG to evaluate for seizure  Level of alertness: Awake, asleep  AEDs during EEG study: VPA  Technical aspects: This EEG study was done with scalp electrodes positioned according to the 10-20 International system of electrode placement. Electrical activity was acquired at a sampling rate of 500Hz  and reviewed with a high frequency filter of 70Hz  and a low frequency filter of 1Hz . EEG data were recorded continuously and digitally stored.   Description: No posterior dominant rhythm was seen. Sleep was characterized by vertex waves, sleep spindles (12 to 14 Hz), maximal frontocentral region.  EEG showed continuous generalized 3 to 6 Hz theta-delta slowing. Independent spikes were seen in left and right frontotemporal region. Hyperventilation and photic stimulation were not performed.     ABNORMALITY -Continuous slow, generalized -Spike, left and right frontotemporal region   IMPRESSION: This study showed evidence of independent epileptogenicity arising  from left and right frontotemporal region as well as moderate diffuse encephalopathy, nonspecific etiology. No seizures were seen throughout the recording.  Krystin Keeven 

## 2020-08-05 NOTE — Discharge Instructions (Signed)
Vit B12 level is very low and she should cotninue to take daily Vit B12 supplements as prescribed and have her level checked in 4 wks by her doctor.   You were cared for by a hospitalist during your hospital stay. If you have any questions about your discharge medications or the care you received while you were in the hospital after you are discharged, you can call the unit and asked to speak with the hospitalist on call if the hospitalist that took care of you is not available. Once you are discharged, your primary care physician will handle any further medical issues.   Please note that NO REFILLS for any discharge medications will be authorized once you are discharged, as it is imperative that you return to your primary care physician (or establish a relationship with a primary care physician if you do not have one) for your aftercare needs so that they can reassess your need for medications and monitor your lab values.  Please take all your medications with you for your next visit with your Primary MD. Please ask your Primary MD to get all Hospital records sent to his/her office. Please request your Primary MD to go over all hospital test results at the follow up.   If you experience worsening of your admission symptoms, develop shortness of breath, chest pain, suicidal or homicidal thoughts or a life threatening emergency, you must seek medical attention immediately by calling 911 or calling your MD.   Bonita Quin must read the complete instructions/literature along with all the possible adverse reactions/side effects for all the medicines you take including new medications that have been prescribed to you. Take new medicines after you have completely understood and accpet all the possible adverse reactions/side effects.    Do not drive when taking pain medications or sedatives.     Do not take more than prescribed Pain, Sleep and Anxiety Medications   If you have smoked or chewed Tobacco in the last  2 yrs please stop. Stop any regular alcohol  and or recreational drug use.   Wear Seat belts while driving.

## 2020-08-05 NOTE — Plan of Care (Signed)

## 2020-08-05 NOTE — Progress Notes (Signed)
Initial Nutrition Assessment  RD working remotely.  DOCUMENTATION CODES:   Not applicable  INTERVENTION:  Recommend liberalizing diet to regular.  Provide Ensure Enlive po BID, each supplement provides 350 kcal and 20 grams of protein.  Provide MVI po daily.  NUTRITION DIAGNOSIS:   Unintentional weight loss related to acute illness (psychotic disorder, pt planned to starve herself) as evidenced by  (chart review, discussion with interpreter).  GOAL:   Patient will meet greater than or equal to 90% of their needs  MONITOR:   PO intake,Supplement acceptance,Labs,Weight trends,I & O's  REASON FOR ASSESSMENT:   Consult Assessment of nutrition requirement/status  ASSESSMENT:   20 year old female [place] refugee who present with progressive depression, confusion, and delusions. Patient admitted with psychotic disorder, possible seizure disorder, also found to have B12 deficiency.   Patient speaks Dari. Per treatment team sticky note patient's daytime interpreter is Mahin [phone]). Able to reach interpreter over the phone. Interpreter attempted several times to call into patient's room but was unsuccessful at each attempt. Per review of chart patient had delusions she was pregnant and had plans to starve herself to death. Interpreter does report that patient has lost weight but is unsure of details.  Discussed with RN via secure chat. Patient with good appetite and intake today. She ate 85% of her breakfast and then ate a large lunch of food brought in by her family. Per chart she ate 100% of breakfast yesterday and 50% of lunch yesterday.  Limited weight history available in chart. Patient was 53.4 kg on 06/20/2020 and was documented to be 51.7 kg on 07/25/2020. That is a weight loss of 1.7 kg (3.2% body weight) over one month, which is not significant for time frame. Also found a weight of 50.8 kg from 2/16, which is also not significant for time frame. Although weight loss is not  yet significant it is concerning and puts patient at risk for development of malnutrition.  Medications reviewed and include: vitamin B12 1000 micrograms IM weekly, thiamine 100 mg daily.  Labs reviewed: Vitamin B12 169. Per review of chart copper lab was also ordered and is pending.  Unable to determine if patient meets criteria for malnutrition at this time.  Per MD okay to liberalize diet to regular.  NUTRITION - FOCUSED PHYSICAL EXAM:  Unable to complete at this time as RD is working remotely.  Diet Order:   Diet Order            Diet Heart Room service appropriate? Yes; Fluid consistency: Thin  Diet effective now                EDUCATION NEEDS:   No education needs have been identified at this time  Skin:  Skin Assessment: Reviewed RN Assessment  Last BM:  Unknown  Height:   Ht Readings from Last 1 Encounters:  07/25/20 5\' 5"  (1.651 m)   Weight:   Wt Readings from Last 1 Encounters:  07/25/20 51.7 kg   BMI:  There is no height or weight on file to calculate BMI.  Estimated Nutritional Needs:   Kcal:  1600-1800  Protein:  80-90 grams  Fluid:  1.6-1.8 L/day  09/22/20, MS, RD, LDN Pager number available on Amion

## 2020-08-05 NOTE — Consult Note (Signed)
Sara Moon is a Farsi  Speaking [place] 20 y.o. 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 seizure Do   Interpreter was not available at the time of my evaluation of the patient history was obtained through the chart interpreter may be available tomorrow Presented with   originally presented to emergency department last week with delusions that she is pregnant and wanting to kill it because she is not a married woman she had a plan to starve herself to death. Family stated patient's behavior has been progressively getting more bizarre for past 6 months Patient as noted above came [place] the [place] and has gradually decompensated the last few months with ongoing depressive symptoms. Admitted to behavioral health for psychosis Per family she has been having daily episodes of unresponsiveness associated rhythmic shaking urine incontinence Since age 34 lasting a few minutes.  Apparently [facility] patient was started on Depakote.  This was stopped about a week ago as patient was not tolerating he had no appetite. Neurology was consulted while behavioral health recommended restarting Depakote at 750 p.o. twice daily Patient was somnolent during the day so her Depakote was changed again for ER 1000 mg p.o. nightly No seizure-like activity was seen since admission  Patient being overall uncooperative answering questions Neurology recommended patient be transferred to [facility] for possible EEG although patient may not be able to cooperate   Patient seen and case discussed with Dr. Lucianne Muss and Dr. Butler Denmark.  Patient is alert and oriented, calm and cooperative, and brightens upon approach.  Patient is observed to be sitting upright with her legs crossed, and a rocking motion.  Patient denies current hallucinations, paranoia, and or psychosis however continues to endorse that she is pregnant at this time despite multiple attempts and telling her she is not pregnant.   Patient does not appear to be responding to auditory visual hallucinations at this time, she does not appear to be responding to internal or external stimuli, and or preoccupied.  Patient and mother both deny previous psychiatric conditions.  Patient and mother recently relocated from [place], in November 2021 in which patient began to exhibit some psychosis, depressive symptoms, and bizarre behavior.  Neurology consult was placed EEG was obtained with impression of epilepsy, will recommendations to continue Depakote 1000 mg p.o. daily.  Psychoeducation provided to mother and both patient, to discuss Depakote management of seizure, and antipsychotic management of agitation, post ictal psychosis, and delusions.  Did review expectations and plan of care with mother, to discuss normal behaviors and what to expect until medication is in her system and is effective within about 2 to 3 weeks.  All questions and concerns were answered, translating services were used through [facility], interpreter was Warden Fillers #[id number].  Will place outpatient referral and discharge information.  Mother did express concerns about transportation, and which she is advised most psychiatric services to include therapy are done virtually at this time.  She appears to be relief, and then requests daily therapeutic services.  Mother is advised patient will need initial evaluation from psychiatry, and attempt to receive regular therapeutic services prior to receiving advance intensive outpatient.  She is advised that daily intensive outpatient options are available, however need to determine if patient meets criteria for such.  We also reviewed barriers that she may face to include language barriers, transportation, and financial costs.  Mother again verbalizes understanding and expresses much excitement towards her daughter being discharged home.  She  is advised if symptoms return, do not improve, and or continue to worsen she may return to the emergency room  for further evaluation.  Sara Moon is is a 20 year old female with seizure-like episodes admitted for new onset of delusions.  Patient is alert and oriented, calm and cooperative.  She is observed to rocking back and forth, and answers most questions by shaking her head yes or no.  She is primarily nonverbal throughout the evaluation, her mother does most of the talking and ask most of the questions.  Patient was able to communicate verbally once throughout the entire evaluation and we she said" thank you very much."  When she discovered she would be going home today.  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  She continues to exhibit delusions, however does not pose a safety threat to herself or others.  She was diagnosed with epilepsy, and started on 1 g of Depakote by neurology.  She has a follow-up appointment with Dr. Marjory Lies on Oct 14, 2020.  She denies any suicidal ideations, homicidal ideations, and or auditory visual hallucinations.   Recommendations:  -Above information has been communicated with her primary team Dr. Butler Denmark via phone call and secure chat.  -Will place order for Abilify 2 mg p.o. once and a single dose, now.  Also place order for Abilify 5 mg p.o. daily to start tomorrow.  Her primary team states prescriptions will be ordered via [facility] pharmacy, and will be delivered to the room this information was relayed to mother and patient. -Due to patient's limited thought processes, nonverbal communication, self-stimulatory behaviors (rocking back and forth)  we will also place a referral for developmental behavioral health, for underlying evaluation and or learning disability.  -From a psychiatric standpoint patient is cleared at this time to discharge home.  Will place referral for outpatient management at [facility] and or behavioral health center of [facility]

## 2020-08-05 NOTE — Discharge Summary (Addendum)
Physician Discharge Summary  Sara Moon KCL:[id number] DOB: 2001-02-28 DOA: 08/03/2020  PCP: Iona Beard, MD  Admit date: 08/03/2020 Discharge date: 08/05/2020  Admitted From: home  Disposition:  home   Recommendations for Outpatient Follow-up:  1. She will need to f/u closely with psychiatry, psychology and neurology 2. She needs a follow up B12 level in 1 month 3. Blood work to be followed: Porphyrins, heavy metals, serum copper, MMA level, Vit B1 level, ANA w/ reflex, Ceruloplasmin  Home Health:  none  Discharge Condition:  stable   CODE STATUS:  Full code   Diet recommendation:  Regular diet Consultations:  psychiatry  neurology  Procedures/Studies: . EEG   Discharge Diagnoses:  Principal Problem:   Psychotic disorder ([place]) Active Problems:   Seizure disorder ([place])    Vitamin B 12 deficiency   Brief Summary:  Sara Moon is a 20  y/o [place] refugee who presents to the ED for progressive depression, confusion and delusions. At this point she believes she is pregnant, does not know who the father is and is asking for an abortion. Family notes episodes of unresponsiveness with rhythmic shaking. She was admitted to be evaluated for seizures.    Hospital Course:  Principal Problem:   Psychotic disorder  - HCG negative- patient is not pregnant as she claims to be - on exam, she is shaking her head back and forth (left and right) constantly in almost a rocking, self soothing motion- she speaks politely and respectfully- she is aware that she is in the hospital and tells me she is here to have an abortion. She does not know how she became pregnant and does not know the father. She cannot tell me the year or the month. She cannot tell me how old she is. She is able to tell me her parent's names. She wants the baby taken care of so she can go home to her parents. - further history obtained from mother via interpretor> her delusions about being pregnant have been present for 3  wks- she moved to the [place] from [place] about 5 months ago. - ? If delusions related to trauma/ depression from moving from [place] to the [place]- the patient's mother is not aware of any other trauma that may have occurred, she specifically denies any knowledge of sexual assault. She may have underlying psych issues as well--  The patient had delayed milestones in infancy & according to her mother, around age 20, she began to withdraw from people and spent time in her room alone in the dark. Her teachers also felt there was something wrong with her - psychiatry has evaluated her and has prescribed Abilify daily- follow up has been arranged and this has been discussed with the patient's mother via the interpretor  - needs ongoing psychiatric and psychological care - TSH normal - ESR normal - UDS + for benzodiazepines - the following work up needs to be followed by PCP> Porphyrins, heavy metals, serum copper, MMA level, Vit B1 level, ANA w/ reflex, Ceruloplasmin   Active Problems:  Seizure disorder  - she has a prior h/o seizure like episodes since age 20- per description from mother, episodes are periods of confusion with left arm twitching. - MRI unrevealing  - EEG > This study showed evidence ofindependentepileptogenicity arising from left and right frontotemporal region as well asmoderate diffuse encephalopathy, nonspecific etiology. - Neurology has resumed her Depakote 1000 mg QHS which was stopped about 1 wk ago by her family  B12 deficiency - B12  level is 169-- oddly, there is no macrocytosis or pancytopenia ( which I often see in B12 deficiency)  - I have ordered s/c B12 1000 mg which was given on 2/20  - have also ordered an MMA level which can be followed up as outpat     Discharge Exam: Vitals:   08/05/20 0816 08/05/20 1200  BP: 104/62   Pulse:  86  Resp:  19  Temp: 97.6 F (36.4 C) 98.6 F (37 C)  SpO2:  98%   Vitals:   08/05/20 0350 08/05/20 0447 08/05/20  0816 08/05/20 1200  BP: (!) 92/53 (!) 94/51 104/62   Pulse: 64   86  Resp: 14   19  Temp: 98.1 F (36.7 C)  97.6 F (36.4 C) 98.6 F (37 C)  TempSrc: Oral  Axillary Oral  SpO2: 98%   98%    General: Pt is alert, awake, not in acute distress Cardiovascular: RRR, S1/S2 +, no rubs, no gallops Respiratory: CTA bilaterally, no wheezing, no rhonchi Abdominal: Soft, NT, ND, bowel sounds + Extremities: no edema, no cyanosis   Discharge Instructions  Discharge Instructions    Increase activity slowly   Complete by: As directed      Allergies as of 08/05/2020   No Known Allergies     Medication List    TAKE these medications   ARIPiprazole 5 MG tablet Commonly known as: ABILIFY Take 1 tablet (5 mg total) by mouth daily. Start taking on: August 06, 2020   divalproex 500 MG DR tablet Commonly known as: Depakote Take 2 tablets (1,000 mg total) by mouth at bedtime.   multivitamin with minerals Tabs tablet Take 1 tablet by mouth daily. Start taking on: August 06, 2020   vitamin B-12 1000 MCG tablet Commonly known as: CYANOCOBALAMIN Take 1 tablet (1,000 mcg total) by mouth daily.       Follow-up Information    [facility]. Call in 1 day(s).   Specialty: Psychology Why: Psychological Evaluation.  Contact information: [address] [place] [address] [place] [address] [phone]        [facility]. Schedule an appointment as soon as possible for a visit in 1 day(s).   Specialty: Urgent Care Why: Medication Management, and Therapy services. They also offer daily services, in which patient may qualify for.  Contact information: [address] [place] [address] [phone]       [facility]. Call.   Why: Make an appointment for medication management and or therapy.  Contact information: [address]  [address] [place] [address] [phone]              No Known Allergies    MR  BRAIN WO CONTRAST  Result Date: 07/25/2020 CLINICAL DATA:  Mental status changes. Aggressive behavior. Hallucinations. EXAM: MRI HEAD WITHOUT CONTRAST TECHNIQUE: Multiplanar, multiecho pulse sequences of the brain and surrounding structures were obtained without intravenous contrast. COMPARISON:  None. FINDINGS: Brain: Diffusion imaging does not show any acute or subacute infarction. No abnormality is seen affecting the brainstem or cerebellum. There are a few small foci of T2 and FLAIR signal with hemispheric white matter, most noticeable adjacent to the posterior body of the left lateral ventricle. These are nonspecific and could be chronic insults, even related to prenatal or perinatal insult. There is some potential that this could represent the earliest manifestation of demyelinating disease/multiple sclerosis. No mass lesion, hemorrhage hydrocephalus or subdural collection. There is an arachnoid cyst at the right posterior  frontal convexity measuring 12 x 24 mm, not likely of any clinical significance. Vascular: Major vessels at the base of the brain show flow. Skull and upper cervical spine: Negative Sinuses/Orbits: Clear/normal Other: None IMPRESSION: 1. No acute brain finding. 2. Few small foci of T2 and FLAIR signal with the hemispheric white matter, most noticeable adjacent to the posterior body of the left lateral ventricle. These are nonspecific and could be chronic insults, even related to prenatal or perinatal insult. There is some potential that this could represent the earliest manifestation of demyelinating disease/multiple sclerosis. 3. 12 x 24 mm arachnoid cyst at the right posterior frontal convexity, not likely of any clinical significance. Electronically Signed   By: [place] Chimes M.D.   On: 07/25/2020 20:26   MR BRAIN W CONTRAST  Result Date: 08/03/2020 CLINICAL DATA:  Encephalopathy. Follow-up of MRI without contrast done 07/25/2020. EXAM: MRI HEAD WITH CONTRAST TECHNIQUE: Multiplanar,  multiecho pulse sequences of the brain and surrounding structures were obtained with intravenous contrast. CONTRAST:  5.38mL GADAVIST GADOBUTROL 1 MMOL/ML IV SOLN COMPARISON:  None. FINDINGS: There is no abnormal contrast enhancement. Nonspecific foci of hyperintense T2-weighted signal within the periventricular white matter are unchanged. Benign arachnoid cyst adjacent to the right frontal operculum. IMPRESSION: No contrast-enhancing lesion. Unchanged nonspecific foci of hyperintense T2-weighted signal. Electronically Signed   By: Ulyses Jarred M.D.   On: 08/03/2020 03:03   MR CERVICAL SPINE W WO CONTRAST  Result Date: 08/03/2020 CLINICAL DATA:  Encephalopathy.  Question demyelinating disease. EXAM: MRI CERVICAL SPINE WITHOUT AND WITH CONTRAST TECHNIQUE: Multiplanar and multiecho pulse sequences of the cervical spine, to include the craniocervical junction and cervicothoracic junction, were obtained without and with intravenous contrast. CONTRAST:  5.5 mL GADAVIST IV SOLN COMPARISON:  Brain MRI this same day. FINDINGS: Alignment: Normal. Vertebrae: No fracture, evidence of discitis, or bone lesion. Cord: Normal signal and morphology. No pathologic enhancement after contrast administration. Posterior Fossa, vertebral arteries, paraspinal tissues: Negative. Disc levels: Disc height and hydration are maintained at all levels. The central canal and foramina are widely patent throughout. IMPRESSION: Negative for demyelinating disease.  Normal cervical spine MRI. Electronically Signed   By: Inge Rise M.D.   On: 08/03/2020 11:18   EEG adult  Result Date: 08/04/2020 Lora Havens, MD     08/04/2020  2:30 PM Patient Name: Sara Moon MRN: [id number] Epilepsy Attending: Lora Havens Referring Physician/Provider: Dr Roanna Banning Date: 08/04/2020 Duration: 24.01 mins Patient history: 19yo F with ams and psychosis. EEG to evaluate for seizure Level of alertness: Awake, asleep AEDs during EEG study: VPA  Technical aspects: This EEG study was done with scalp electrodes positioned according to the 10-20 International system of electrode placement. Electrical activity was acquired at a sampling rate of $Remov'500Hz'tFKIMN$  and reviewed with a high frequency filter of $RemoveB'70Hz'LnlwGCbC$  and a low frequency filter of $RemoveB'1Hz'QoYnaiUE$ . EEG data were recorded continuously and digitally stored. Description: No posterior dominant rhythm was seen. Sleep was characterized by vertex waves, sleep spindles (12 to 14 Hz), maximal frontocentral region.  EEG showed continuous generalized 3 to 6 Hz theta-delta slowing. Independent spikes were seen in left and right frontotemporal region. Hyperventilation and photic stimulation were not performed.   ABNORMALITY -Continuous slow, generalized -Spike, left and right frontotemporal region IMPRESSION: This study showed evidence of independent epileptogenicity arising   from left and right frontotemporal region as well as moderate diffuse encephalopathy, nonspecific etiology. No seizures were seen throughout the recording. Priyanka O Yadav   EEG LTVM -  Continuous Bedside W/ Video Includes Portable EEG Read  Result Date: 08/05/2020 Lora Havens, MD     08/05/2020  1:10 PM Patient Name: Sara Moon MRN: [id number] Epilepsy Attending: Lora Havens Referring Physician/Provider: Dr Roanna Banning Duration: 08/04/2020 1218 to 08/05/2020 1154  Patient history: 19yo F with ams and psychosis. EEG to evaluate for seizure  Level of alertness: Awake, asleep  AEDs during EEG study: VPA  Technical aspects: This EEG study was done with scalp electrodes positioned according to the 10-20 International system of electrode placement. Electrical activity was acquired at a sampling rate of $Remov'500Hz'cPBKqD$  and reviewed with a high frequency filter of $RemoveB'70Hz'PXPuVzab$  and a low frequency filter of $RemoveB'1Hz'tHqsqTus$ . EEG data were recorded continuously and digitally stored.  Description: No posterior dominant rhythm was seen. Sleep was characterized by vertex waves, sleep  spindles (12 to 14 Hz), maximal frontocentral region.  EEG showed continuous generalized 3 to 6 Hz theta-delta slowing. Independent spikes were seen in left and right frontotemporal region. Hyperventilation and photic stimulation were not performed.    ABNORMALITY -Continuous slow, generalized -Spike, left and right frontotemporal region  IMPRESSION: This study showed evidence of independent epileptogenicity arising  from left and right frontotemporal region as well as moderate diffuse encephalopathy, nonspecific etiology. No seizures were seen throughout the recording.  Lora Havens      The results of significant diagnostics from this hospitalization (including imaging, microbiology, ancillary and laboratory) are listed below for reference.     Microbiology: Recent Results (from the past 240 hour(s))  Resp Panel by RT-PCR (Flu A&B, Covid) Nasopharyngeal Swab     Status: None   Collection Time: 07/31/20 10:28 AM   Specimen: Nasopharyngeal Swab; Nasopharyngeal(NP) swabs in vial transport medium  Result Value Ref Range Status   SARS Coronavirus 2 by RT PCR NEGATIVE NEGATIVE Final    Comment: (NOTE) SARS-CoV-2 target nucleic acids are NOT DETECTED.  The SARS-CoV-2 RNA is generally detectable in upper respiratory specimens during the acute phase of infection. The lowest concentration of SARS-CoV-2 viral copies this assay can detect is 138 copies/mL. A negative result does not preclude SARS-Cov-2 infection and should not be used as the sole basis for treatment or other patient management decisions. A negative result may occur with  improper specimen collection/handling, submission of specimen other than nasopharyngeal swab, presence of viral mutation(s) within the areas targeted by this assay, and inadequate number of viral copies(<138 copies/mL). A negative result must be combined with clinical observations, patient history, and epidemiological information. The expected result is  Negative.  Fact Sheet for Patients:  EntrepreneurPulse.com.au  Fact Sheet for Healthcare Providers:  IncredibleEmployment.be  This test is no t yet approved or cleared by the [place] FDA and  has been authorized for detection and/or diagnosis of SARS-CoV-2 by FDA under an Emergency Use Authorization (EUA). This EUA will remain  in effect (meaning this test can be used) for the duration of the COVID-19 declaration under Section 564(b)(1) of the Act, 21 U.S.C.section 360bbb-3(b)(1), unless the authorization is terminated  or revoked sooner.       Influenza A by PCR NEGATIVE NEGATIVE Final   Influenza B by PCR NEGATIVE NEGATIVE Final    Comment: (NOTE) The Xpert Xpress SARS-CoV-2/FLU/RSV plus assay is intended as an aid in the diagnosis of influenza from Nasopharyngeal swab specimens and should not be used as a sole basis for treatment. Nasal washings and aspirates are unacceptable for Xpert Xpress SARS-CoV-2/FLU/RSV testing.  Fact Sheet for Patients: EntrepreneurPulse.com.au  Fact Sheet for Healthcare Providers: IncredibleEmployment.be  This test is not yet approved or cleared by the [place] FDA and has been authorized for detection and/or diagnosis of SARS-CoV-2 by FDA under an Emergency Use Authorization (EUA). This EUA will remain in effect (meaning this test can be used) for the duration of the COVID-19 declaration under Section 564(b)(1) of the Act, 21 U.S.C. section 360bbb-3(b)(1), unless the authorization is terminated or revoked.  Performed at [facility], [place] [address], [place], [place] [address]      Labs: BNP (last 3 results) No results for input(s): BNP in the last 8760 hours. Basic Metabolic Panel: Recent Labs  Lab 08/02/20 1805 08/04/20 0427  NA 141 141  K 3.9 3.7  CL 106 108  CO2 24 23  GLUCOSE 112* 79  BUN 16 11  CREATININE 0.87 0.45   CALCIUM 9.1 9.0  MG  --  1.8  PHOS  --  3.8   Liver Function Tests: Recent Labs  Lab 08/02/20 1805 08/04/20 0427  AST 15 14*  ALT 12 12  ALKPHOS 50 35*  BILITOT 0.6 0.3  PROT 7.3 5.8*  ALBUMIN 4.4 3.6   No results for input(s): LIPASE, AMYLASE in the last 168 hours. No results for input(s): AMMONIA in the last 168 hours. CBC: Recent Labs  Lab 08/02/20 1805 08/04/20 0427  WBC 5.9 6.5  NEUTROABS 3.5 3.7  HGB 12.7 12.2  HCT 39.7 36.3  MCV 88.8 85.0  PLT 319 303   Cardiac Enzymes: No results for input(s): CKTOTAL, CKMB, CKMBINDEX, TROPONINI in the last 168 hours. BNP: Invalid input(s): POCBNP CBG: No results for input(s): GLUCAP in the last 168 hours. D-Dimer No results for input(s): DDIMER in the last 72 hours. Hgb A1c Recent Labs    08/02/20 1805  HGBA1C 5.3   Lipid Profile Recent Labs    08/02/20 1805  CHOL 152  HDL 54  LDLCALC 85  TRIG 64  CHOLHDL 2.8   Thyroid function studies Recent Labs    08/02/20 1805  TSH 0.406   Anemia work up Recent Labs    08/04/20 0427  VITAMINB12 169*  FOLATE 7.9   Urinalysis No results found for: COLORURINE, APPEARANCEUR, LABSPEC, [place], GLUCOSEU, [place], [place], [place], PROTEINUR, UROBILINOGEN, NITRITE, LEUKOCYTESUR Sepsis Labs Invalid input(s): PROCALCITONIN,  WBC,  LACTICIDVEN Microbiology Recent Results (from the past 240 hour(s))  Resp Panel by RT-PCR (Flu A&B, Covid) Nasopharyngeal Swab     Status: None   Collection Time: 07/31/20 10:28 AM   Specimen: Nasopharyngeal Swab; Nasopharyngeal(NP) swabs in vial transport medium  Result Value Ref Range Status   SARS Coronavirus 2 by RT PCR NEGATIVE NEGATIVE Final    Comment: (NOTE) SARS-CoV-2 target nucleic acids are NOT DETECTED.  The SARS-CoV-2 RNA is generally detectable in upper respiratory specimens during the acute phase of infection. The lowest concentration of SARS-CoV-2 viral copies this assay can detect is 138 copies/mL. A negative  result does not preclude SARS-Cov-2 infection and should not be used as the sole basis for treatment or other patient management decisions. A negative result may occur with  improper specimen collection/handling, submission of specimen other than nasopharyngeal swab, presence of viral mutation(s) within the areas targeted by this assay, and inadequate number of viral copies(<138 copies/mL). A negative result must be combined with clinical observations, patient history, and epidemiological information. The expected result is Negative.  Fact Sheet for Patients:  EntrepreneurPulse.com.au  Fact Sheet for Healthcare Providers:  IncredibleEmployment.be  This test is no  t yet approved or cleared by the [place] and  has been authorized for detection and/or diagnosis of SARS-CoV-2 by FDA under an Emergency Use Authorization (EUA). This EUA will remain  in effect (meaning this test can be used) for the duration of the COVID-19 declaration under Section 564(b)(1) of the Act, 21 U.S.C.section 360bbb-3(b)(1), unless the authorization is terminated  or revoked sooner.       Influenza A by PCR NEGATIVE NEGATIVE Final   Influenza B by PCR NEGATIVE NEGATIVE Final    Comment: (NOTE) The Xpert Xpress SARS-CoV-2/FLU/RSV plus assay is intended as an aid in the diagnosis of influenza from Nasopharyngeal swab specimens and should not be used as a sole basis for treatment. Nasal washings and aspirates are unacceptable for Xpert Xpress SARS-CoV-2/FLU/RSV testing.  Fact Sheet for Patients: EntrepreneurPulse.com.au  Fact Sheet for Healthcare Providers: IncredibleEmployment.be  This test is not yet approved or cleared by the [place] FDA and has been authorized for detection and/or diagnosis of SARS-CoV-2 by FDA under an Emergency Use Authorization (EUA). This EUA will remain in effect (meaning this test can be used)  for the duration of the COVID-19 declaration under Section 564(b)(1) of the Act, 21 U.S.C. section 360bbb-3(b)(1), unless the authorization is terminated or revoked.  Performed at [facility], [place] [address] [place], [place] [address]      Time coordinating discharge in minutes: 65  SIGNED:   Debbe Odea, MD  [facility] 08/05/2020, 4:21 PM

## 2020-08-05 NOTE — Progress Notes (Signed)
LTM discontinued; [facility] notified, minor skin breakdown at [facility].

## 2020-08-06 LAB — PROTEIN ELECTROPHORESIS, SERUM
A/G Ratio: 1.7 (ref 0.7–1.7)
Albumin ELP: 3.5 g/dL (ref 2.9–4.4)
Alpha-1-Globulin: 0.2 g/dL (ref 0.0–0.4)
Alpha-2-Globulin: 0.6 g/dL (ref 0.4–1.0)
Beta Globulin: 0.7 g/dL (ref 0.7–1.3)
Gamma Globulin: 0.7 g/dL (ref 0.4–1.8)
Globulin, Total: 2.1 g/dL — ABNORMAL LOW (ref 2.2–3.9)
Total Protein ELP: 5.6 g/dL — ABNORMAL LOW (ref 6.0–8.5)

## 2020-08-06 LAB — HEAVY METALS PROFILE, URINE
Arsenic (Total),U: NOT DETECTED ug/L (ref 0–9)
Creatinine(Crt),U: 0.44 g/L (ref 0.30–3.00)
Inorg. As/Crt Ratio: UNDETERMINED ug/g creat
Lead, Rand Ur: NOT DETECTED ug/L (ref 0–49)
Mercury, Ur: NOT DETECTED ug/L (ref 0–19)
Total Volume: 2000

## 2020-08-07 LAB — PORPHYRINS, FRACTIONATED URINE (TIMED COLLECTION)
Copropor(CP)III,24hr: 46 ug/24 hr (ref 0–74)
Coproporph(CP)I,24hr: 24 ug/24 hr (ref 0–24)
Coproporphyrin I: 12 ug/L
Coproporphyrin III: 23 ug/L
Heptacarboxyl (7-CP): <1 ug/L
Heptacarboxyl Porphyrin, 24H Ur: <2 ug/24 hr (ref 0–4)
Hexacarboxyl (6-CP): <1 ug/L
Hexacarboxyporphyrin: <2 ug/24 hr (ref 0–1)
Pentacarboxyl (5-CP): <1 ug/L
Pentacarboxyporphyrin: <2 ug/24 hr (ref 0–4)
Total Volume: 2000
Uroporphyrin, Urine: 8 ug/24 hr (ref 0–24)
Uroporphyrins (UP): 4 ug/L

## 2020-08-07 LAB — COPPER, SERUM: Copper: 66 ug/dL — ABNORMAL LOW (ref 80–158)

## 2020-08-07 LAB — UPEP/UIFE/LIGHT CHAINS/TP, 24-HR UR
% BETA, Urine: 0 %
ALPHA 1 URINE: 0 %
Albumin, U: 100 %
Alpha 2, Urine: 0 %
Free Kappa Lt Chains,Ur: 1.04 mg/L (ref 0.63–113.79)
Free Kappa/Lambda Ratio: >1.55 (ref 1.03–31.76)
Free Lambda Lt Chains,Ur: <0.67 mg/L (ref 0.47–11.77)
GAMMA GLOBULIN URINE: 0 %
Total Protein, Urine-Ur/day: 92 mg/24 hr (ref 30–150)
Total Protein, Urine: 4.6 mg/dL
Total Volume: 2000

## 2020-08-08 ENCOUNTER — Encounter: Payer: Self-pay | Admitting: Pediatric Intensive Care

## 2020-08-09 LAB — METHYLMALONIC ACID, SERUM: Methylmalonic Acid, Quantitative: 111 nmol/L (ref 0–378)

## 2020-08-12 ENCOUNTER — Telehealth: Payer: Self-pay | Admitting: Pediatric Intensive Care

## 2020-08-12 NOTE — Telephone Encounter (Signed)
Call to [facility] on behalf of client to reschedule appointment. Client's parents requesting to have appointment moved up due to recent hospitalization. New appointment is 09/09/20 arrive at 1600.  Shann Medal RN BSN CNP [phone]

## 2020-08-12 NOTE — Telephone Encounter (Signed)
Call to [facility], client's intensive case manager at [employer]. Marylene Land is assisting client's mother [name] at present. Client's mother is concerned that client has been sleeping a lot and that client's face has been "red". CN advised call to PCP office to advise if symptoms persist. Cn advised CM that client neuro appointment is now 09/09/20 at 1600. CN also advised that [facility] staff said that client would need to walk in for appointment 7-11am Monday-Thursday. CM expressed concerns about client walking in due to [facility] waiting area environment. CN states she will work on another alternative for follow up. Shann Medal RN BSN CNP [phone]

## 2020-08-12 NOTE — Telephone Encounter (Signed)
Call to [facility] to schedule hospital follow up appointment. Was advised to have client come in during walk in hours 7-11am Monday through Thursday. Shann Medal RN BSN CNP [phone]

## 2020-08-13 LAB — CERULOPLASMIN: Ceruloplasmin: 14.7 mg/dL — ABNORMAL LOW (ref 19.0–39.0)

## 2020-08-13 LAB — VITAMIN B1: Vitamin B1 (Thiamine): 120.9 nmol/L (ref 66.5–200.0)

## 2020-08-13 NOTE — Congregational Nurse Program (Signed)
  Dept: [phone]   Congregational Nurse Program Note  Date of Encounter: 08/08/2020  Past Medical History: Past Medical History:  Diagnosis Date  . Concern for seizure disorder (HCC)   . Depression     Encounter Details: Meeting at home withDiba, Jullian's parent's, her sponsor, CM Marylene Land for [employer] and [employer], Dari interpreter from [employer]. Introductions were made to family and team members. Ruie is quiet, averts eyes. She has repetitive head movement her mother states she has had since she was young. Mother states it is a calming motion. CN asks client how she is feeling. Client does not reply but smiles. Client appears uncomfortable with the large group. CN asks mother if client has endorsed SI or hallucinations and states that client has not. CN advises family that no follow up appointments are scheduled at present. Stresses importance that BH follow up appointment must be secured soon as client was given 1 month of Abilify on discharge. Client's parents understand. They would like assistance with follow up appointments. Parents also states that they would like to move Zaylah's neurology appointment up. CN advised that she will contact neurology office. CN stressed importance of follow up with PCP for labs within next 30 days. Client's sponsor [name] will schedule PCP appointment for Darrel. CN asks parents if it is alright to communicate with Marylene Land CM at [facility]. Parent's consent to this. CN advised parents to call CN if client has any return of symptoms. Continue taking medication as prescribed. Parents verbalize understanding. CN projects meeting with parents, client and case manager in 2 weeks for follow up. Shann Medal Rn BSN CNP [phone]

## 2020-08-14 ENCOUNTER — Telehealth: Payer: Self-pay | Admitting: Pediatric Intensive Care

## 2020-08-14 NOTE — Telephone Encounter (Signed)
Call from [place] at [facility]. She reports that Tawney and her mother [name] went to a Women's group meeting at [facility] on Monday. She states that Mehek left the group to be by herself. CN reviewed upcoming BH appointment time and date. CM will be able to take Kalee to that appointment. New patient packet will be sent to CM. CM acknowledges need to reinforce team availability with family.  Shann Medal RN BSN CNP [phone]

## 2020-08-14 NOTE — Telephone Encounter (Signed)
Brief call to update sponsor on availability of this CN to meet in person with client and family. Shann Medal RN BSN CNP [phone]

## 2020-08-20 ENCOUNTER — Other Ambulatory Visit: Payer: Self-pay

## 2020-08-20 ENCOUNTER — Encounter (HOSPITAL_COMMUNITY): Payer: Self-pay | Admitting: Psychiatry

## 2020-08-20 ENCOUNTER — Ambulatory Visit (INDEPENDENT_AMBULATORY_CARE_PROVIDER_SITE_OTHER): Payer: Medicaid Other | Admitting: Psychiatry

## 2020-08-20 ENCOUNTER — Other Ambulatory Visit (HOSPITAL_COMMUNITY): Payer: Self-pay | Admitting: Psychiatry

## 2020-08-20 VITALS — BP 103/62 | HR 87 | Ht 61.0 in | Wt 117.2 lb

## 2020-08-20 DIAGNOSIS — F330 Major depressive disorder, recurrent, mild: Secondary | ICD-10-CM | POA: Diagnosis not present

## 2020-08-20 DIAGNOSIS — F29 Unspecified psychosis not due to a substance or known physiological condition: Secondary | ICD-10-CM | POA: Diagnosis not present

## 2020-08-20 MED ORDER — ARIPIPRAZOLE 5 MG PO TABS: 5.0000 mg | ORAL_TABLET | Freq: Every day | ORAL | 2 refills | Status: DC

## 2020-08-20 NOTE — Progress Notes (Signed)
Psychiatric Initial Adult Assessment   Patient Identification: Sara Moon MRN:  [id number] Date of Evaluation:  08/20/2020 Referral Source: [facility] Chief Complaint:  " I feel so weak.  I really want to stop a medication" Chief Complaint    New Patient (Initial Visit)     Visit Diagnosis:    ICD-10-CM   1. Psychosis, unspecified psychosis type (HCC)  F29 Hepatic function panel    CBC w/Diff/Platelet    ARIPiprazole (ABILIFY) 5 MG tablet    Valproic Acid level  2. Mild episode of recurrent major depressive disorder (HCC)  F33.0 ARIPiprazole (ABILIFY) 5 MG tablet    History of Present Illness: 20 year old female seen today for initial psychiatric evaluation.  She was referred to outpatient psychiatry by [facility] where she was seen on 08/03/2020 through 08/05/2020.  She has a psychiatric history of psychotic disorder, psycho organic syndrome, and depression.  She is currently managed on Abilify 5 mg and Depakote 1000 mg nightly.  She notes her medications are somewhat effective in managing her psychiatric conditions.  Today provider utilized an interpreter as patient speaks [place].  Today she is pleasant, calm, cooperative, restless (rocking which her mother notes she has done since childhood) and engaged in conversation.  She informed provider that since her hospitalization she has been feeling weak and notes that she attributed it to the medication.  She informed Clinical research associate that she is also having increased pain when she walks for more than 10 minutes.  She informed provider that the pain is undescribable however reports that she has pain in her ankles and has shortness of breath.  Patient notes that she does have anxiety and depression at times.  Provider conducted a GAD-7 and a PHQ-9 and patient scored a 9 on both.  Today she denies SI/HI/VAH, or paranoia.  At times she notes that she is irritable and fights with her words but denies other symptoms of mania.  She notes that her sleep and appetite are  adequate.  Patient notes that she has been in the [place] for a year.  She informed provider that she is a refugee from [place].  Patient mother reports that losing their home and finances has been devastating for her entire family.  Patient denies symptoms of PTSD.  Patient was seen with her mother who notes that her daughter has been somewhat better since starting medication however notes that she has been complaining of weakness.  She inform her that she is unsure if her daughter should stop all medications.  Today she is agreeable to discontinuing Depakote 1000 mg nightly.  She will continue Abilify 5 mg to help manage depressive symptoms.  At this time patient does not want to start any other medications.  She has an appointment with neurology on 09/09/2020.  Provider also recommended that patient have labs drawn and she is agreeable to having a CBC, valproic acid level, and liver function test done.  No other concerns noted at this time.  Depression Symptoms:  psychomotor agitation, fatigue, loss of energy/fatigue, (Hypo) Manic Symptoms:  Elevated Mood, Flight of Ideas, Irritable Mood, Anxiety Symptoms:  Excessive Worry, Psychotic Symptoms:  Denies PTSD Symptoms: NA  Past Psychiatric History: Depression, psychotic disorder Previous Psychotropic Medications: Abilify and gabapenttin  Substance Abuse History in the last 12 months:  No.  Consequences of Substance Abuse: NA  Past Medical History:  Past Medical History:  Diagnosis Date  . Concern for seizure disorder (HCC)   . Depression     Past Surgical History:  Procedure Laterality Date  . APPENDECTOMY  2008    Family Psychiatric History: Denies  Family History:  Family History  Problem Relation Age of Onset  . Hypertension Other     Social History:   Social History   Socioeconomic History  . Marital status: Single    Spouse name: Not on file  . Number of children: 0  . Years of education: Not on file   . Highest education level: Not on file  Occupational History  . Not on file  Tobacco Use  . Smoking status: Never Smoker  . Smokeless tobacco: Never Used  Vaping Use  . Vaping Use: Never used  Substance and Sexual Activity  . Alcohol use: Never  . Drug use: Never  . Sexual activity: Never    Birth control/protection: None  Other Topics Concern  . Not on file  Social History Narrative   Lives with mother and father and older brother in [place]. Sixth of seven children. Other siblings in refugee camps across [place] and [place] anticipate will be joining family soon. Currently taking English classes    Social Determinants of Health   Financial Resource Strain: Not on file  Food Insecurity: Not on file  Transportation Needs: Not on file  Physical Activity: Not on file  Stress: Not on file  Social Connections: Not on file    Additional Social History: Patient resides in [address] with her parents and brother. She is single and has no children. Currently she is unemployed. She denies tobacco, alcohol, or illegal drug use.  Allergies:  No Known Allergies  Metabolic Disorder Labs: Lab Results  Component Value Date   HGBA1C 5.3 08/02/2020   MPG 105.41 08/02/2020   No results found for: PROLACTIN Lab Results  Component Value Date   CHOL 152 08/02/2020   TRIG 64 08/02/2020   HDL 54 08/02/2020   CHOLHDL 2.8 08/02/2020   VLDL 13 08/02/2020   LDLCALC 85 08/02/2020   Lab Results  Component Value Date   TSH 0.406 08/02/2020    Therapeutic Level Labs: No results found for: LITHIUM No results found for: CBMZ Lab Results  Component Value Date   VALPROATE <4 (L) 07/25/2020    Current Medications: Current Outpatient Medications  Medication Sig Dispense Refill  . divalproex (DEPAKOTE) 500 MG DR tablet Take 2 tablets (1,000 mg total) by mouth at bedtime. 60 tablet 2  . Multiple Vitamin (MULTIVITAMIN WITH MINERALS) TABS tablet Take 1 tablet by mouth daily. 30 tablet 0  . vitamin  B-12 (CYANOCOBALAMIN) 1000 MCG tablet Take 1 tablet (1,000 mcg total) by mouth daily. 30 tablet 0  . ARIPiprazole (ABILIFY) 5 MG tablet Take 1 tablet (5 mg total) by mouth daily. 30 tablet 2   No current facility-administered medications for this visit.    Musculoskeletal: Strength & Muscle Tone: within normal limits Gait & Station: normal Patient leans: N/A  Psychiatric Specialty Exam: Review of Systems  Blood pressure 103/62, pulse 87, height 5\' 1"  (1.549 m), weight 117 lb 3.2 oz (53.2 kg), SpO2 100 %.Body mass index is 22.14 kg/m.  General Appearance: Well Groomed  Eye Contact:  Good  Speech:  Clear and Coherent and Normal Rate  Volume:  Normal  Mood:  Anxious and Depressed  Affect:  Appropriate and Congruent  Thought Process:  Coherent, Goal Directed and Linear  Orientation:  Full (Time, Place, and Person)  Thought Content:  WDL and Logical  Suicidal Thoughts:  No  Homicidal Thoughts:  No  Memory:  Immediate;  Good Recent;   Good Remote;   Good  Judgement:  Good  Insight:  Good  Psychomotor Activity:  Restlessness  Concentration:  Concentration: Good and Attention Span: Good  Recall:  Good  Fund of Knowledge:Good  Language: Good  Akathisia:  No  Handed:  Right  AIMS (if indicated):  Not done  Assets:  Communication Skills Desire for Improvement Financial Resources/Insurance Housing Social Support  ADL's:  Intact  Cognition: WNL  Sleep:  Good   Screenings: AIMS   Flowsheet Row [facility]  AIMS Total Score 0    AUDIT   Flowsheet Row [facility]  Alcohol Use Disorder Identification Test Final Score (AUDIT) 0    GAD-7   Flowsheet Row Office Visit from 08/20/2020 in [facility]  Total GAD-7 Score 9    PHQ2-9   Flowsheet Row Office Visit from 08/20/2020 in [facility] Office Visit from 07/04/2020 in [facility]  PHQ-2 Total Score 3 0  PHQ-9 Total Score 9 0    Flowsheet Row Office Visit from 08/20/2020 in [facility] Ad[facility] (Discharged[facility] ED from 07/25/2020 in [facility]  C-SSRS RISK CATEGORY No Risk No Risk No Risk      Assessment and Plan: Patient endorses symptoms of anxiety and depression however reports that she believes her medications are making her feel weak.  She notes that she wants to try to discontinue at least one of them.  Today she is agreeable to discontinuing Depakote 1000 mg nightly.  She will continue Abilify at this time.  Patient will follow up in 1 month with provider.  She also has an appointment with a neurologist on 09/09/2020.  Provider encouraged patient to have her labs drawn (CBC, valproic acid level, and hepatic functions).  1. Psychosis, unspecified psychosis type (HCC)  - Hepatic function panel - CBC w/Diff/Platelet Contiue- ARIPiprazole (ABILIFY) 5 MG tablet; Take 1 tablet (5 mg total) by mouth daily.  Dispense: 30 tablet; Refill: 2 - Valproic Acid level  2. Mild episode of recurrent major depressive disorder (HCC)  Continue- ARIPiprazole (ABILIFY) 5 MG tablet; Take 1 tablet (5 mg total) by mouth daily.  Dispense: 30 tablet; Refill: 2   Shanna Cisco, NP 3/8/202211:28 AM

## 2020-08-21 LAB — HEPATIC FUNCTION PANEL
ALT: 9 IU/L (ref 0–32)
AST: 17 IU/L (ref 0–40)
Albumin: 5 g/dL (ref 3.9–5.0)
Alkaline Phosphatase: 52 IU/L (ref 44–121)
Bilirubin Total: 0.4 mg/dL (ref 0.0–1.2)
Bilirubin, Direct: 0.12 mg/dL (ref 0.00–0.40)
Total Protein: 7.4 g/dL (ref 6.0–8.5)

## 2020-08-21 LAB — CBC WITH DIFFERENTIAL/PLATELET
Basophils Absolute: 0 10*3/uL (ref 0.0–0.2)
Basos: 0 %
EOS (ABSOLUTE): 0.2 10*3/uL (ref 0.0–0.4)
Eos: 2 %
Hematocrit: 37.4 % (ref 34.0–46.6)
Hemoglobin: 12.4 g/dL (ref 11.1–15.9)
Immature Grans (Abs): 0.1 10*3/uL (ref 0.0–0.1)
Immature Granulocytes: 1 %
Lymphocytes Absolute: 1.6 10*3/uL (ref 0.7–3.1)
Lymphs: 21 %
MCH: 28.6 pg (ref 26.6–33.0)
MCHC: 33.2 g/dL (ref 31.5–35.7)
MCV: 86 fL (ref 79–97)
Monocytes Absolute: 0.8 10*3/uL (ref 0.1–0.9)
Monocytes: 10 %
Neutrophils Absolute: 5.1 10*3/uL (ref 1.4–7.0)
Neutrophils: 66 %
Platelets: 313 10*3/uL (ref 150–450)
RBC: 4.33 x10E6/uL (ref 3.77–5.28)
RDW: 13.5 % (ref 11.7–15.4)
WBC: 7.7 10*3/uL (ref 3.4–10.8)

## 2020-08-21 LAB — VALPROIC ACID LEVEL: Valproic Acid Lvl: 66 ug/mL (ref 50–100)

## 2020-08-22 LAB — MISC LABCORP TEST (SEND OUT): Labcorp test code: 505400

## 2020-08-27 ENCOUNTER — Encounter (HOSPITAL_COMMUNITY): Payer: Self-pay | Admitting: Emergency Medicine

## 2020-08-27 ENCOUNTER — Encounter: Payer: Self-pay | Admitting: Pediatric Intensive Care

## 2020-08-27 ENCOUNTER — Other Ambulatory Visit: Payer: Self-pay

## 2020-08-27 ENCOUNTER — Inpatient Hospital Stay (HOSPITAL_COMMUNITY)
Admission: EM | Admit: 2020-08-27 | Discharge: 2020-08-29 | DRG: 101 | Disposition: A | Discharge status: Home / Self Care | Payer: Medicaid Other | Attending: Internal Medicine | Admitting: Internal Medicine

## 2020-08-27 DIAGNOSIS — G40909 Epilepsy, unspecified, not intractable, without status epilepticus: Principal | ICD-10-CM | POA: Diagnosis present

## 2020-08-27 DIAGNOSIS — F323 Major depressive disorder, single episode, severe with psychotic features: Secondary | ICD-10-CM | POA: Diagnosis present

## 2020-08-27 DIAGNOSIS — R569 Unspecified convulsions: Secondary | ICD-10-CM

## 2020-08-27 DIAGNOSIS — Z79899 Other long term (current) drug therapy: Secondary | ICD-10-CM

## 2020-08-27 DIAGNOSIS — G930 Cerebral cysts: Secondary | ICD-10-CM | POA: Diagnosis present

## 2020-08-27 DIAGNOSIS — E871 Hypo-osmolality and hyponatremia: Secondary | ICD-10-CM | POA: Diagnosis present

## 2020-08-27 DIAGNOSIS — E538 Deficiency of other specified B group vitamins: Secondary | ICD-10-CM | POA: Diagnosis present

## 2020-08-27 DIAGNOSIS — Z20822 Contact with and (suspected) exposure to covid-19: Secondary | ICD-10-CM | POA: Diagnosis present

## 2020-08-27 DIAGNOSIS — Z8249 Family history of ischemic heart disease and other diseases of the circulatory system: Secondary | ICD-10-CM

## 2020-08-27 LAB — COMPREHENSIVE METABOLIC PANEL
ALT: 17 U/L (ref 0–44)
AST: 18 U/L (ref 15–41)
Albumin: 4.6 g/dL (ref 3.5–5.0)
Alkaline Phosphatase: 39 U/L (ref 38–126)
Anion gap: 10 (ref 5–15)
BUN: 13 mg/dL (ref 6–20)
CO2: 22 mmol/L (ref 22–32)
Calcium: 9.6 mg/dL (ref 8.9–10.3)
Chloride: 101 mmol/L (ref 98–111)
Creatinine, Ser: 0.53 mg/dL (ref 0.44–1.00)
GFR, Estimated: >60 mL/min (ref >60)
Glucose, Bld: 90 mg/dL (ref 70–99)
Potassium: 3.5 mmol/L (ref 3.5–5.1)
Sodium: 133 mmol/L — ABNORMAL LOW (ref 135–145)
Total Bilirubin: 0.7 mg/dL (ref 0.3–1.2)
Total Protein: 7.7 g/dL (ref 6.5–8.1)

## 2020-08-27 LAB — RAPID URINE DRUG SCREEN, HOSP PERFORMED
Amphetamines: NOT DETECTED
Barbiturates: NOT DETECTED
Benzodiazepines: NOT DETECTED
Cocaine: NOT DETECTED
Opiates: NOT DETECTED
Tetrahydrocannabinol: NOT DETECTED

## 2020-08-27 LAB — CBC
HCT: 37.7 % (ref 36.0–46.0)
Hemoglobin: 12.2 g/dL (ref 12.0–15.0)
MCH: 28.2 pg (ref 26.0–34.0)
MCHC: 32.4 g/dL (ref 30.0–36.0)
MCV: 87.1 fL (ref 80.0–100.0)
Platelets: 345 10*3/uL (ref 150–400)
RBC: 4.33 MIL/uL (ref 3.87–5.11)
RDW: 14 % (ref 11.5–15.5)
WBC: 9.4 10*3/uL (ref 4.0–10.5)
nRBC: 0 % (ref 0.0–0.2)

## 2020-08-27 LAB — ETHANOL: Alcohol, Ethyl (B): <10 mg/dL (ref <10)

## 2020-08-27 LAB — I-STAT BETA HCG BLOOD, ED (MC, WL, AP ONLY): I-stat hCG, quantitative: <5 m[IU]/mL (ref <5)

## 2020-08-27 LAB — SALICYLATE LEVEL: Salicylate Lvl: <7 mg/dL — ABNORMAL LOW (ref 7.0–30.0)

## 2020-08-27 LAB — ACETAMINOPHEN LEVEL: Acetaminophen (Tylenol), Serum: <10 ug/mL — ABNORMAL LOW (ref 10–30)

## 2020-08-27 MED ORDER — VALPROATE SODIUM 100 MG/ML IV SOLN
750.0000 mg | Freq: Once | INTRAVENOUS | Status: AC
  Administered 2020-08-27: 750 mg via INTRAVENOUS
  Filled 2020-08-27: qty 7.5

## 2020-08-27 NOTE — H&P (Incomplete)
     Date: 08/27/2020               Patient Name:  Sara Moon MRN: [id number]  DOB: February 12, 2001 Age / Sex: 20 y.o., female   PCP: Quincy Simmonds, MD         Medical Service: Internal Medicine Teaching Service         Attending Physician: Dr. Terald Sleeper, MD    First Contact: Dr. Marland Kitchen Pager: [phone]  Second Contact: Dr. Marchia Bond Pager: [phone]       After Hours (After 5p/  First Contact Pager: [phone]  weekends / holidays): Second Contact Pager: [phone]   Chief Complaint: Seizures  History of Present Illness:   History obtained with the help of patient's sister translating and mother. She was recently hospitalized in February and has been doing poorly since. Report 7 episodes of seizures today. She has been having seizures for the past 4 days. During these episodes she clenches her fists, shaking of her right arm, redness of her face, has a runny nose and watering at the mouth. Since she left the hospital this was the most serious seizure she has had, it lasted around 6-8 min. She always complains of pain in her eyes after having seizure. She states sometimes she sees "something dark" and blurry. She also complains of eye heaviness. She sometimes does not realize she even had a seizure. Today she felt normal after the episode but sometimes she questions what happened. She is not usually tired afterwards.   She stopped taking the depakote last Tuesday. She was feeling generalized weakness and fatigue due to the depakote. She was not started on any new medication after depakote was discontinued. When she was initially started on depakote 500 mg once a day she tolerated this well.   Denies nausea, vomiting, fevers, urinary or bowel incontinence. She has not fallen or hit her head.   She has been taking her other medication every morning.    Meds: *** No outpatient medications have been marked as taking for the 08/27/20 encounter [facility] Encounter).     Allergies: Allergies as of  08/27/2020  . (No Known Allergies)   Past Medical History:  Diagnosis Date  . Concern for seizure disorder (HCC)   . Depression     Family History: ***  Social History: ***  Review of Systems: A complete ROS was negative except as per HPI. ***  Physical Exam: Blood pressure 104/60, pulse 71, temperature 99.1 F (37.3 C), temperature source Oral, resp. rate (!) 23, weight (!) 546 kg, SpO2 100 %. ***  EKG: personally reviewed my interpretation is***  CXR: personally reviewed my interpretation is***  Assessment & Plan by Problem: Active Problems:   * No active hospital problems. *   Dispo: Admit patient to Observation with expected length of stay less than 2 midnights.  Signed: Jaci Standard, DO 08/27/2020, 10:54 PM  Pager: @MYPAGER @ After 5pm on weekdays and 1pm on weekends: On Call pager: [phone]

## 2020-08-27 NOTE — Progress Notes (Unsigned)
Spoke with client's mother while at [name] office hours. Client's mother states that client is having seizures again. CN will staff message provider at [facility] Neurology. Shann Medal RN BSN CNP [phone]
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
## 2020-08-27 NOTE — ED Notes (Signed)
Called to room by mother, pt witnessed having a blank stare and shaking, pt responds to visual threat and has no post ictal state, pt having pseudoseizures, MD aware.

## 2020-08-27 NOTE — ED Provider Notes (Signed)
[facility] EMERGENCY DEPARTMENT Provider Note   CSN: [id number] Arrival date & time: 08/27/20  1936     History Chief Complaint  Patient presents with  . Seizures    Sara Moon is a 20 y.o. female.  Patient with history of depression, seizures diagnosed by EEG (07/2020) presents for evaluation of seizures. Per mother, she has had multiple seizures over the last 4 days, reporting 7 today. Mom brought her to the ED after a 10 minute seizure tonight. No injury or falls during episodes. No vomiting or urinary incontinence. The patient denies headache, chest or abdominal pain. No recent illness. Per medical record review, the patient was prescribed 1000 mg Depakote by neurology that was stopped by psychiatry on 08/20/20 secondary to symptoms of weakness and fatigue.   The history is provided by the patient and a parent. A language interpreter was used (Farsi language interpreter, and family member via phone).  Seizures      Past Medical History:  Diagnosis Date  . Concern for seizure disorder (HCC)   . Depression     Patient Active Problem List   Diagnosis Date Noted  . Mild episode of recurrent major depressive disorder (HCC) 08/20/2020  . 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 Vitamin B 12 deficiency   . Psychotic disorder (HCC) 07/31/2020  . Seizure disorder (HCC) 06/23/2020  . Depression 06/23/2020    Past Surgical History:  Procedure Laterality Date  . APPENDECTOMY  2008     OB History   No obstetric history on file.     Family History  Problem Relation Age of Onset  . Hypertension Other     Social History   Tobacco Use  . Smoking status: Never Smoker  . Smokeless tobacco: Never Used  Vaping Use  . Vaping Use: Never used  Substance Use Topics  . Alcohol use: Never  . Drug use: Never    Home Medications Prior to Admission medications   Medication Sig Start Date End Date Taking? Authorizing Provider  ARIPiprazole (ABILIFY) 5 MG  tablet Take 1 tablet (5 mg total) by mouth daily. 08/20/20   Shanna Cisco, NP  divalproex (DEPAKOTE) 500 MG DR tablet Take 2 tablets (1,000 mg total) by mouth at bedtime. 08/05/20 11/03/20  Calvert Cantor, MD  Multiple Vitamin (MULTIVITAMIN WITH MINERALS) TABS tablet Take 1 tablet by mouth daily. 08/06/20   Calvert Cantor, MD  vitamin B-12 (CYANOCOBALAMIN) 1000 MCG tablet Take 1 tablet (1,000 mcg total) by mouth daily. 08/05/20   Calvert Cantor, MD    Allergies    Patient has no known allergies.  Review of Systems   Review of Systems  Constitutional: Negative for chills and fever.  HENT: Negative.   Eyes: Positive for pain. Negative for visual disturbance.  Respiratory: Negative.   Cardiovascular: Negative.   Gastrointestinal: Negative.  Negative for vomiting.  Musculoskeletal: Negative.  Negative for myalgias.  Skin: Negative.  Negative for color change.  Neurological: Positive for seizures and weakness. Negative for headaches.    Physical Exam Updated Vital Signs BP 104/60   Pulse 71   Temp 99.1 F (37.3 C) (Oral)   Resp (!) 23   SpO2 100%   Physical Exam Vitals and nursing note reviewed.  Constitutional:      Appearance: Normal appearance. She is well-developed.     Comments: Appears fatigued, sleepy  HENT:     Head: Normocephalic and atraumatic.     Mouth/Throat:     Mouth: Mucous membranes are moist.  Eyes:     Conjunctiva/sclera: Conjunctivae normal.     Pupils: Pupils are equal, round, and reactive to light.  Cardiovascular:     Rate and Rhythm: Normal rate and regular rhythm.     Heart sounds: No murmur heard.   Pulmonary:     Effort: Pulmonary effort is normal.     Breath sounds: Normal breath sounds. No wheezing, rhonchi or rales.  Abdominal:     General: Bowel sounds are normal.     Palpations: Abdomen is soft.     Tenderness: There is no abdominal tenderness. There is no guarding or rebound.  Musculoskeletal:        General: Normal range of motion.      Cervical back: Normal range of motion and neck supple.  Skin:    General: Skin is warm and dry.     Findings: No rash.  Neurological:     General: No focal deficit present.     Mental Status: She is alert.     Comments: CN's 3-12 grossly intact. Speech is clear and focused. No facial asymmetry. No lateralizing weakness. No deficits of coordination.        ED Results / Procedures / Treatments   Labs (all labs ordered are listed, but only abnormal results are displayed) Labs Reviewed  COMPREHENSIVE METABOLIC PANEL - Abnormal; Notable for the following components:      Result Value   Sodium 133 (*)    All other components within normal limits  SALICYLATE LEVEL - Abnormal; Notable for the following components:   Salicylate Lvl <7.0 (*)    All other components within normal limits  ACETAMINOPHEN LEVEL - Abnormal; Notable for the following components:   Acetaminophen (Tylenol), Serum <10 (*)    All other components within normal limits  ETHANOL  CBC  RAPID URINE DRUG SCREEN, HOSP PERFORMED  CBG MONITORING, ED  I-STAT BETA HCG BLOOD, ED ([facility], [facility], [facility] ONLY)    EKG None  Radiology No results found.  Procedures Procedures   Medications Ordered in ED Medications - No data to display  ED Course  I have reviewed the triage vital signs and the nursing notes.  Pertinent labs & imaging results that were available during my care of the patient were reviewed by me and considered in my medical decision making (see chart for details).    MDM Rules/Calculators/A&P                          Patient to ED for evaluation of multiple seizures over 4 days, worsening in duration and frequency today. Medication treating seizures was stopped 3/8.   The patient is awake, neurologically intact without deficit on exam. Neuro-hospitalist was consulted and advised Depakote loading (15 mg/kg) IV and overnight observation for further seizure activity. Internal Medicine paged for  admission.  Internal Medicine Team accepts for observation admission.    Final Clinical Impression(s) / ED Diagnoses Final diagnoses:  None   1. Seizure disorder 2. Multiple seizures   Rx / DC Orders ED Discharge Orders    None       Elpidio Anis, Cordelia Poche 08/27/20 2300    Terald Sleeper, MD 08/28/20 [phone]

## 2020-08-27 NOTE — ED Notes (Signed)
Neurology at bedside.

## 2020-08-27 NOTE — H&P (Addendum)
Date: 08/28/2020               Patient Name:  Sara Moon MRN: [id number]  DOB: Feb 12, 2001 Age / Sex: 20 y.o., female   PCP: Quincy Simmonds, MD         Medical Service: Internal Medicine Teaching Service         Attending Physician: Dr. Earl Lagos, MD    First Contact: Dr. Laddie Aquas Pager: [phone]  Second Contact: Dr. 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  History obtained with the help of patient's sister translating and mother. She was recently hospitalized in February and has been doing poorly since. Report 7 episodes of seizures today. She has been having seizures for the past 4 days. During these episodes she clenches her fists, shaking of her right arm, redness of her face, has a runny nose and frothing at the mouth. Since she left the hospital this was the most serious seizure she has had, it lasted around 6-8 min. She always complains of pain in her eyes after having seizure. She states sometimes she sees "something dark" and blurry. She also complains of eye heaviness. She sometimes does not realize she even had a seizure. Today she felt normal after the episode but sometimes she questions what happened. She is not usually tired afterwards.   Mother states she has been taking depakote 1000 mg at bedtime. She stopped taking the depakote last Tuesday after psych visit. She was feeling generalized weakness and fatigue due to the depakote. She was not started on any new medication after depakote was discontinued. When she was initially started on depakote 500 mg once a day she tolerated this well. Although per chart review appears she was taking this dose inconsistently.   Denies fever, chills,  nausea, vomiting, fevers, urinary or bowel incontinence. Denies hallucinations, SI, or HI. She has not fallen or hit her head. She has been taking her aripiprazole every morning.    Meds:  No current facility-administered medications on file prior to encounter.   Current Outpatient Medications on File Prior to Encounter  Medication Sig Dispense Refill  . ARIPiprazole (ABILIFY) 5 MG tablet Take 1 tablet (5 mg total) by mouth daily. 30 tablet 2  . divalproex (DEPAKOTE) 500 MG DR tablet Take 2 tablets (1,000 mg total) by mouth at bedtime. 60 tablet 2  . Multiple Vitamin (MULTIVITAMIN WITH MINERALS) TABS tablet Take 1 tablet by mouth daily. 30 tablet 0  . vitamin B-12 (CYANOCOBALAMIN) 1000 MCG tablet Take 1 tablet (1,000 mcg total) by mouth daily. 30 tablet 0    Allergies: Allergies as of 08/27/2020  . (No Known Allergies)   Past Medical History:  Diagnosis Date  . Concern for seizure disorder (HCC)   . Depression     Family History: No known family history  Social History: Lives with mother and father in [place]  Review of Systems: A complete ROS was negative except as per HPI.  Physical Exam: Blood pressure 108/62, pulse 77, temperature 99 F (37.2 C), temperature source Oral, resp. rate 18, weight 56 kg, SpO2 100 %. Constitutional: Appears well-developed and well-nourished. No distress.  HENT: Normocephalic and atraumatic, EOMI, conjunctiva normal, moist mucous membranes Cardiovascular:  Normal rate, regular rhythm, S1 and S2 present, no murmurs, rubs, gallops.  Distal pulses intact Respiratory: No respiratory distress, no accessory muscle use.  Effort is normal.  Lungs are clear to auscultation bilaterally. GI: Nondistended, soft, nontender to palpation, normal active bowel sounds Musculoskeletal: Normal bulk and tone.  No peripheral edema noted. Neurological: Is alert and oriented x4, Strength 5/5 bilaterally, sensation grossly intact, vision grossly intact, no nystagmus, no  tongue bite, CN intact Skin: Warm and dry.  Angular cheilitis  Psychiatric: Normal mood and affect. 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  Seizure Patient with history of seizure like episodes since she was 20 years old. Patient admitted for seizure and psychotic disorder in February. EEG with independent epileptogenic from left and right frontotemporal regions and diffuse encephalopathy and discharged on Depakote 1000 mg at night. MRI brain with nonspecific small foci of hyperintensity on T2 and small arachnoid cyst. Patient reports generalized weakness and fatigue while on Depakote and after discussion at psychiatric visi on 08/20/2020 was decided to discontinue this medication. Subsequently having more seizure episodes for the past 4 days. CBC, CMP with mid hyponatremia of 133. Ethanol level <10. Salicylate <7. Acetaminophen <10. On exam she is at her baseline, no seizure activity noted, no focal neurologic deficits. ED provider discussed with neurology and was advised to load on Depakote 15 mg/kg and resume on depakote and neurology follow up on 3/28. I am concerned that the patient will not be able to tolerate currently dose of Depakote and will continue to have poor control of her seizures given ignorant symptoms of weakness.Would discuss medication recommendations with neurolog in the morning. She may benefit from lower dose vs alternative regimen - Depakote 750 mg - Seizure precautions - Moniter CMP  B12 deficiency B12 level of 169, folate and MMA level within normal limits. Started on B12 1000 mg daily at last admission. No macrocytosis or pancytopenia. Unclear what is causing this. Will discuss dietary  habits with patient. If no obvious dietary cause can also consider pernicious anemia and h pylori. - recheck B12 levels continue on B12 supplementation  Depression with psychotic features Currently on Abilify 5 mg daily. Previously hospital for psychosis in February. Currently no AUD, HI, or SI. - Continue Abilify 5 mg daily   Dispo: Admit patient to Observation with expected length of stay less than 2 midnights.  Signed: Quincy Simmonds, MD 08/28/2020, 12:33 AM  Pager: [phone] After 5pm on weekdays and 1pm on weekends: On Call pager: [phone]

## 2020-08-28 ENCOUNTER — Telehealth: Payer: Self-pay | Admitting: Pediatric Intensive Care

## 2020-08-28 ENCOUNTER — Telehealth: Payer: Self-pay | Admitting: Diagnostic Neuroimaging

## 2020-08-28 DIAGNOSIS — G40909 Epilepsy, unspecified, not intractable, without status epilepticus: Secondary | ICD-10-CM | POA: Diagnosis present

## 2020-08-28 DIAGNOSIS — Z79899 Other long term (current) drug therapy: Secondary | ICD-10-CM | POA: Diagnosis not present

## 2020-08-28 DIAGNOSIS — F333 Major depressive disorder, recurrent, severe with psychotic symptoms: Secondary | ICD-10-CM | POA: Diagnosis not present

## 2020-08-28 DIAGNOSIS — Z8249 Family history of ischemic heart disease and other diseases of the circulatory system: Secondary | ICD-10-CM | POA: Diagnosis not present

## 2020-08-28 DIAGNOSIS — F323 Major depressive disorder, single episode, severe with psychotic features: Secondary | ICD-10-CM | POA: Diagnosis present

## 2020-08-28 DIAGNOSIS — E871 Hypo-osmolality and hyponatremia: Secondary | ICD-10-CM | POA: Diagnosis present

## 2020-08-28 DIAGNOSIS — Z20822 Contact with and (suspected) exposure to covid-19: Secondary | ICD-10-CM | POA: Diagnosis present

## 2020-08-28 DIAGNOSIS — E538 Deficiency of other specified B group vitamins: Secondary | ICD-10-CM | POA: Diagnosis present

## 2020-08-28 DIAGNOSIS — R569 Unspecified convulsions: Secondary | ICD-10-CM | POA: Diagnosis present

## 2020-08-28 DIAGNOSIS — G930 Cerebral cysts: Secondary | ICD-10-CM | POA: Diagnosis present

## 2020-08-28 LAB — RESP PANEL BY RT-PCR (FLU A&B, COVID) ARPGX2
Influenza A by PCR: NEGATIVE
Influenza B by PCR: NEGATIVE
SARS Coronavirus 2 by RT PCR: NEGATIVE

## 2020-08-28 LAB — BASIC METABOLIC PANEL
Anion gap: 9 (ref 5–15)
BUN: 13 mg/dL (ref 6–20)
CO2: 22 mmol/L (ref 22–32)
Calcium: 9.1 mg/dL (ref 8.9–10.3)
Chloride: 102 mmol/L (ref 98–111)
Creatinine, Ser: 0.57 mg/dL (ref 0.44–1.00)
GFR, Estimated: >60 mL/min (ref >60)
Glucose, Bld: 87 mg/dL (ref 70–99)
Potassium: 3.2 mmol/L — ABNORMAL LOW (ref 3.5–5.1)
Sodium: 133 mmol/L — ABNORMAL LOW (ref 135–145)

## 2020-08-28 LAB — CBC
HCT: 32.4 % — ABNORMAL LOW (ref 36.0–46.0)
Hemoglobin: 11.1 g/dL — ABNORMAL LOW (ref 12.0–15.0)
MCH: 29.2 pg (ref 26.0–34.0)
MCHC: 34.3 g/dL (ref 30.0–36.0)
MCV: 85.3 fL (ref 80.0–100.0)
Platelets: 305 10*3/uL (ref 150–400)
RBC: 3.8 MIL/uL — ABNORMAL LOW (ref 3.87–5.11)
RDW: 14 % (ref 11.5–15.5)
WBC: 10.5 10*3/uL (ref 4.0–10.5)
nRBC: 0 % (ref 0.0–0.2)

## 2020-08-28 LAB — VITAMIN B12: Vitamin B-12: 627 pg/mL (ref 180–914)

## 2020-08-28 LAB — VALPROIC ACID LEVEL: Valproic Acid Lvl: 37 ug/mL — ABNORMAL LOW (ref 50.0–100.0)

## 2020-08-28 MED ORDER — POTASSIUM CHLORIDE CRYS ER 20 MEQ PO TBCR
30.0000 meq | EXTENDED_RELEASE_TABLET | Freq: Once | ORAL | Status: AC
  Administered 2020-08-28: 30 meq via ORAL
  Filled 2020-08-28: qty 1

## 2020-08-28 MED ORDER — ACETAMINOPHEN 325 MG PO TABS: 650.0000 mg | ORAL_TABLET | Freq: Four times a day (QID) | ORAL | Status: DC | PRN

## 2020-08-28 MED ORDER — VITAMIN B-12 1000 MCG PO TABS
1000.0000 ug | ORAL_TABLET | Freq: Every day | ORAL | Status: DC
  Administered 2020-08-28 – 2020-08-29 (×2): 1000 ug via ORAL
  Filled 2020-08-28 (×2): qty 1

## 2020-08-28 MED ORDER — ACETAMINOPHEN 650 MG RE SUPP: 650.0000 mg | Freq: Four times a day (QID) | RECTAL | Status: DC | PRN

## 2020-08-28 MED ORDER — ARIPIPRAZOLE 5 MG PO TABS
5.0000 mg | ORAL_TABLET | Freq: Every day | ORAL | Status: DC
  Administered 2020-08-28 – 2020-08-29 (×2): 5 mg via ORAL
  Filled 2020-08-28 (×3): qty 1

## 2020-08-28 MED ORDER — LORAZEPAM 2 MG/ML IJ SOLN: 0.5000 mg | INTRAMUSCULAR | Status: DC | PRN

## 2020-08-28 MED ORDER — LAMOTRIGINE 25 MG PO TABS
25.0000 mg | ORAL_TABLET | Freq: Every day | ORAL | Status: DC
  Administered 2020-08-28 – 2020-08-29 (×2): 25 mg via ORAL
  Filled 2020-08-28 (×2): qty 1

## 2020-08-28 MED ORDER — ENOXAPARIN SODIUM 40 MG/0.4ML ~~LOC~~ SOLN
40.0000 mg | Freq: Every day | SUBCUTANEOUS | Status: DC
  Administered 2020-08-28 – 2020-08-29 (×2): 40 mg via SUBCUTANEOUS
  Filled 2020-08-28 (×2): qty 0.4

## 2020-08-28 MED ORDER — LEVETIRACETAM 500 MG PO TABS
500.0000 mg | ORAL_TABLET | Freq: Two times a day (BID) | ORAL | Status: DC
  Administered 2020-08-28 – 2020-08-29 (×3): 500 mg via ORAL
  Filled 2020-08-28 (×3): qty 1

## 2020-08-28 MED ORDER — ADULT MULTIVITAMIN W/MINERALS CH
1.0000 | ORAL_TABLET | Freq: Every day | ORAL | Status: DC
  Administered 2020-08-28 – 2020-08-29 (×2): 1 via ORAL
  Filled 2020-08-28 (×2): qty 1

## 2020-08-28 NOTE — Telephone Encounter (Signed)
Call from [place], [place] at [facility]. She states that Everett's mother is endorsing that Caia is having seizures again. She states that Chad, Anabia's mother states that Winefred has been talking to herself and had picked up a knife yesterday. CN advises that she will call [facility] for advice and will schedule return [facility] appointment for client. Shann Medal RN BSN CNP [phone]

## 2020-08-28 NOTE — Telephone Encounter (Signed)
Noted. -VRP 

## 2020-08-28 NOTE — Telephone Encounter (Signed)
Called [place] and advised her that since patient is currently in the hospital, the neurologist there will make medication decisions regarding her seizures.  Advised she has follow up with Dr Marjory Lies on 09/09/20; [place] stated she is aware and will make sure patient comes in for follow up. She verbalized understanding, appreciation.

## 2020-08-28 NOTE — ED Notes (Signed)
Attempted report x1. 

## 2020-08-28 NOTE — Progress Notes (Signed)
HD#0 Subjective:  Overnight Events: Patient admitted  Patient sitting in bed with mother at bedside.  Translator used for this interview, patient and mother both  speak [place].  Mother states overall patient has improved since admission.  Denies any seizures since admission.  Mother states she is concerned the patient was taken off Depakote without taper, and believes that this led to patient's seizures.  Discussed with her that we will talk with neurology about dosing.  Also informed her that she is able to call internal medicine clinic with any questions that we can assist her in coordinating care between physicians.  Addendum: After evaluating the patient, received page from nursing staff that she had a seizure-like episode.  Neurology on-call was immediately consulted.  Objective:  Vital signs in last 24 hours: Vitals:   08/28/20 0400 08/28/20 0600 08/28/20 0825 08/28/20 1153  BP: (!) 99/48 103/64  (!) 98/52  Pulse: 66 72 72 65  Resp: 20 13 14 15   Temp:   98.7 F (37.1 C) 98.5 F (36.9 C)  TempSrc:   Oral Oral  SpO2: 98% 99% 98% 99%  Weight:       Supplemental O2: Room Air SpO2: 99 %   Physical Exam:  General: Awake, alert, oriented x3, NAD CVs: Regular rate and rhythm, normal heart sounds Lungs: CTA bilaterally Extremities: No edema noted, nontender to palpation Psych: Flat affect HEENT: Normocephalic, atraumatic Skin: Warm and dry Neuro: Oriented x3, follows commands, has involuntary side-to-side head movements (per her mother has been present since childhood)   Exam above as per Dr. , attending  [facility] Weights   08/27/20 2250  Weight: 56 kg     Intake/Output Summary (Last 24 hours) at 08/28/2020 1704 Last data filed at 08/28/2020 1421 Gross per 24 hour  Intake 480 ml  Output -  Net 480 ml   Net IO Since Admission: 480 mL [08/28/20 1704]  Pertinent Labs: CBC Latest Ref Rng & Units 08/28/2020 08/27/2020 08/20/2020  WBC 4.0 - 10.5 K/uL 10.5 9.4 7.7   Hemoglobin 12.0 - 15.0 g/dL 11.1(L) 12.2 12.4  Hematocrit 36.0 - 46.0 % 32.4(L) 37.7 37.4  Platelets 150 - 400 K/uL 305 345 313    CMP Latest Ref Rng & Units 08/28/2020 08/27/2020 08/20/2020  Glucose 70 - 99 mg/dL 87 90 -  BUN 6 - 20 mg/dL 13 13 -  Creatinine 10/20/2020 - 1.00 mg/dL 8.24 2.35 -  Sodium 3.61 - 145 mmol/L 133(L) 133(L) -  Potassium 3.5 - 5.1 mmol/L 3.2(L) 3.5 -  Chloride 98 - 111 mmol/L 102 101 -  CO2 22 - 32 mmol/L 22 22 -  Calcium 8.9 - 10.3 mg/dL 9.1 9.6 -  Total Protein 6.5 - 8.1 g/dL - 7.7 7.4  Total Bilirubin 0.3 - 1.2 mg/dL - 0.7 0.4  Alkaline Phos 38 - 126 U/L - 39 52  AST 15 - 41 U/L - 18 17  ALT 0 - 44 U/L - 17 9    Imaging: No results found.  Assessment/Plan:   Active Problems:   Seizure [facility])   Patient Summary: Sara Moon is a 20 y.o. with a pertinent PMH of seizure disorder and depression with psychotic features who presented with seizure-like activity and admitted for medication management and further evaluation of her seizures.  Recurrent seizures:  Patient at baseline upon examination, had subsequent seizure afterwards.  She was on Depakote 750 mg.  Episode that occurred last approximately 2 minutes.  Patient to be observed today and discharged  home tomorrow. -Lamictal 25 mg daily and Keppra 500 mg daily, plan to taper off of Keppra. -Ativan as needed DC home tomorrow if doing well   Major depression with psychosis:  Well-controlled on Abilify 5 mg daily -Follow-up with psych on outpatient basis -No further work-up at this time.   Diet: Normal IVF: None,None VTE: Enoxaparin Code: Full PT/OT recs: None Family Update: Mother at bedside   Dispo: Anticipated discharge to Home tomorrow  Thalia Bloodgood DO Internal Medicine Resident PGY-1 Pager [phone] Please contact the on call pager after 5 pm and on weekends at [phone]. ]

## 2020-08-28 NOTE — Telephone Encounter (Signed)
Shann Medal from [facility] called needing to speak to the RN or Provider about the pt's f/u with the [facility]. provider she saw. Pt was on divalproex (DEPAKOTE) 500 MG DR tablet and the [facility] provider has taken her off of the medication and now the pt has started having her seizures again. Please call [place] back when available.

## 2020-08-28 NOTE — Consult Note (Signed)
Neurology Consultation Reason for Consult: seizure Referring Physician:  Dr Elpidio Anis  CC: seizure  History is obtained from: Patient's mother at bedside, patient sister, chart reviewed  HPI: Sara Moon is a 20 y.o. female with history of epilepsy well-controlled on Depakote who presented with multiple breakthrough seizures.  Patient's mother states patient saw psychiatry on 08/20/2020 and was told to stop taking Depakote.  She stopped taking it and about 4 days ago started having 2-3 seizures lasting 2 to 3 minutes every day until yesterday when she had 7 seizures, lasting 5 to 10 minutes.  Describes seizures as right> left upper extremity jerking, nonverbal, frothing at mouth followed by excessive fatigue.  On review of chart,  patient saw Ms. Doyne Keel, NP and reported feeling weak on Depakote. I called and spoke with Ms. Doyne Keel who reports that she thought patient was on Depakote for mood stabilization and therefore discontinued it.   ROS: All other systems reviewed and negative except as noted in the HPI.   Past Medical History:  Diagnosis Date  . Concern for seizure disorder (HCC)   . Depression     Family History  Problem Relation Age of Onset  . Hypertension Other     Social History:  reports that she has never smoked. She has never used smokeless tobacco. She reports that she does not drink alcohol and does not use drugs.   Medications Prior to Admission  Medication Sig Dispense Refill Last Dose  . ARIPiprazole (ABILIFY) 5 MG tablet Take 1 tablet (5 mg total) by mouth daily. 30 tablet 2 08/27/2020 at Unknown time  . divalproex (DEPAKOTE) 500 MG DR tablet Take 2 tablets (1,000 mg total) by mouth at bedtime. 60 tablet 2 Past Week at Unknown time  . Multiple Vitamin (MULTIVITAMIN WITH MINERALS) TABS tablet Take 1 tablet by mouth daily. 30 tablet 0 08/27/2020 at Unknown time  . vitamin B-12 (CYANOCOBALAMIN) 1000 MCG tablet Take 1 tablet (1,000 mcg total) by mouth daily. 30  tablet 0 08/27/2020 at Unknown time      Exam: Current vital signs: BP 103/64   Pulse 72   Temp 98.7 F (37.1 C) (Oral)   Resp 14   Wt 56 kg   SpO2 98%   BMI 23.33 kg/m  Vital signs in last 24 hours: Temp:  [98.6 F (37 C)-99.1 F (37.3 C)] 98.7 F (37.1 C) (03/16 0825) Pulse Rate:  [66-93] 72 (03/16 0825) Resp:  [11-23] 14 (03/16 0825) BP: (98-120)/(48-88) 103/64 (03/16 0600) SpO2:  [98 %-100 %] 98 % (03/16 0825) Weight:  [56 kg] 56 kg (03/15 2250)   Physical Exam  Constitutional: Appears well-developed and well-nourished.  Psych: Smiling, moving head side to side, saying thank you Eyes: No scleral injection HENT: No OP obstrucion Head: Normocephalic.  Cardiovascular: Normal rate and regular rhythm.  Respiratory: Effort normal, non-labored breathing GI: Soft.  No distension. There is no tenderness.  Skin: Warm Neuro: Awake, alert, follows simple commands, spontaneously moving upper extremities in bed  I have reviewed labs in epic and the results pertinent to this consultation are: CBC:  Recent Labs  Lab 08/27/20 1958 08/28/20 0251  WBC 9.4 10.5  HGB 12.2 11.1*  HCT 37.7 32.4*  MCV 87.1 85.3  PLT 345 305    Basic Metabolic Panel:  Lab Results  Component Value Date   NA 133 (L) 08/28/2020   K 3.2 (L) 08/28/2020   CO2 22 08/28/2020   GLUCOSE 87 08/28/2020   BUN 13 08/28/2020  CREATININE 0.57 08/28/2020   CALCIUM 9.1 08/28/2020   GFRNONAA >60 08/28/2020   GFRAA 151 07/25/2020   Lipid Panel:  Lab Results  Component Value Date   LDLCALC 85 08/02/2020   HgbA1c:  Lab Results  Component Value Date   HGBA1C 5.3 08/02/2020   Urine Drug Screen:     Component Value Date/Time   LABOPIA NONE DETECTED 08/27/2020 1945   COCAINSCRNUR NONE DETECTED 08/27/2020 1945   LABBENZ NONE DETECTED 08/27/2020 1945   AMPHETMU NONE DETECTED 08/27/2020 1945   THCU NONE DETECTED 08/27/2020 1945   LABBARB NONE DETECTED 08/27/2020 1945    Alcohol Level      Component Value Date/Time   ETH <10 08/27/2020 1958     I have reviewed the images obtained: MRI brain with and without contrast 07/25/2020, 08/03/2020: 1. No acute brain finding. 2. Few small foci of T2 and FLAIR signal with the hemispheric white matter, most noticeable adjacent to the posterior body of the left lateral ventricle. These are nonspecific and could be chronic insults, even related to prenatal or perinatal insult. There is some potential that this could represent the earliest manifestation of demyelinating disease/multiple sclerosis. 3. 12 x 24 mm arachnoid cyst at the right posterior frontal convexity, not likely of any clinical significance. No contrast-enhancing lesion. Unchanged nonspecific foci of hyperintense T2-weighted signal.   ASSESSMENT/PLAN: 20 year old female with history of epilepsy who presented with breakthrough seizures in the setting of discontinuation of AEDs  Epilepsy with breakthrough seizures -Etiology of breakthrough seizures: Discontinuation of Depakote.  Recommendations: -Patient's mother reports significant lethargy on Depakote.   Is already off of Depakote, had side effects and teratogenic potential, we will switch patient to a different AED.  Discussed about starting lamotrigine.  However will need to be on additional AED while slowly titrating lamotrigine to therapeutic levels.  Therefore will start patient on lamotrigine taper and bridge with Keppra  Lamotrigine Morning Evening  3/16-3/20 25mg    3/21-3/27 25mg  25mg   3/28 -4/3 50mg  25mg   4/4 - 4/10 50mg  50mg   4/11- 4/17 75mg  50mg   4/18-4/24 75mg  75mg   4/25-5/1 100mg  75mg   5/2 onwards 100mg  100mg     Keppra Morning Evening  3/16- 4/3 500mg  500mg   4/4-5/1 250mg   250 mg    -Intranasal Versed 5mg  for seizure lasting more than 5 minutes. Call your neurologist after the seizure if needed.  If patient has another seizure, please call 911. -Continue seizure precautions -As needed IV Ativan 2 mg  for clinical seizure-like activity  Thank you for allowing 6/11 to participate in the care of this patient. If you have any further questions, please contact  me or neurohospitalist.   5/17 Epilepsy [facility] neurohospitalist

## 2020-08-28 NOTE — Telephone Encounter (Signed)
Call to Outpatient Pharmacy to refill client medication. They do not have her Medicaid number on file. They also have her DOB listed as Feb 14, 2001 which does not match demographics in EMR. CN advised she will email client's CM at [facility] for MCD number and correct DOB. Shann Medal RN BSN CNP [phone]

## 2020-08-28 NOTE — Telephone Encounter (Signed)
Call to [facility]- spoke with front desk and explained that client had stopped taking depakote and is now having seizures per mother's description. Call will be routed to [facility] nurse for call back. Shann Medal RN BSN CNP [phone]

## 2020-08-28 NOTE — Hospital Course (Addendum)
Ms. Minteer states that she feels much better since Depakote was restarted yesterday. She wonders how she will feel over the next several hours. Her mother wonders what dose she will be on as she experienced significant fatigue with her previous dose she was taking. She has an appointment with neurology on 09/09/20. Her mother states that she has had head shaking back and forth since she was a child. She has had intermittent episodes of inability to move her extremities. She has received EEG's and MRI's in the past which her mother states was negative. She was unable to follow up after 6 months of treatment and instead came to the [place]. The patient is eager to return home. Mother states she occasionally has pain in her ankles and fingers although she denies any currently. Patient denies swelling of her legs or any abdominal pain.   TO DO: reach out to neuro to clarify her dosing; call to get her an appointment with [facility] early next week Replaced K+ oral ? Copper deficiency - zinc pending

## 2020-08-29 ENCOUNTER — Other Ambulatory Visit: Payer: Self-pay | Admitting: Student

## 2020-08-29 LAB — BASIC METABOLIC PANEL
Anion gap: 9 (ref 5–15)
BUN: 16 mg/dL (ref 6–20)
CO2: 23 mmol/L (ref 22–32)
Calcium: 9 mg/dL (ref 8.9–10.3)
Chloride: 104 mmol/L (ref 98–111)
Creatinine, Ser: 0.61 mg/dL (ref 0.44–1.00)
GFR, Estimated: >60 mL/min (ref >60)
Glucose, Bld: 89 mg/dL (ref 70–99)
Potassium: 3.5 mmol/L (ref 3.5–5.1)
Sodium: 136 mmol/L (ref 135–145)

## 2020-08-29 MED ORDER — MIDAZOLAM 5 MG/ML ADULT INJ FOR INTRANASAL USE (MC USE ONLY): 5.0000 mg | Freq: Once | INTRAMUSCULAR | 0 refills | Status: DC

## 2020-08-29 MED ORDER — LAMOTRIGINE 25 MG PO TABS: 25.0000 mg | ORAL_TABLET | Freq: Every day | ORAL | 0 refills | Status: DC

## 2020-08-29 MED ORDER — LEVETIRACETAM 500 MG PO TABS: 500.0000 mg | ORAL_TABLET | Freq: Two times a day (BID) | ORAL | 0 refills | Status: DC

## 2020-08-29 NOTE — Discharge Instructions (Signed)
  ?  ? ?  Z?  ~  ~? Please follow the schedule below for your lamotrigine and Keppra Lamotrigine Morning Evening  3/16-3/20 25mg    3/21-3/27 25mg  25mg   3/28 -4/3 50mg  25mg   4/4 - 4/10 50mg  50mg   4/11- 4/17 75mg  50mg   4/18-4/24 75mg  75mg   4/25-5/1 100mg  75mg   5/2 onwards 100mg  100mg      ~??~     ?  ?~   ?? ~.        ? ??? ~?.   ?     ~?  ?~   ~  ~ ?.  ?? ?      ?? I have reached out to our clinic to schedule you a follow up appointment. Please also follow up with your neurologist. Please do not stop taking your seizure medications unless you have already spoken with a doctor. If you have any hesitation, please call our office   Keppra Morning Evening  3/16- 4/3 500mg  500mg   4/4-5/1 250mg   250 mg

## 2020-08-29 NOTE — Progress Notes (Signed)
Discharge instructions (including medications) discussed with and copy provided to patient/caregiver 

## 2020-08-29 NOTE — Progress Notes (Addendum)
Subjective: No seizures overnight.  No new concerns this morning.  ROS: negative except above  Examination  Vital signs in last 24 hours: Temp:  [97.7 F (36.5 C)-99 F (37.2 C)] 98.2 F (36.8 C) (03/17 0400) Pulse Rate:  [64-71] 64 (03/17 0400) Resp:  [15-18] 18 (03/17 0400) BP: (95-98)/(46-53) 95/46 (03/17 0400) SpO2:  [97 %-100 %] 97 % (03/17 0400)  General: lying in bed, not in apparent distress CVS: pulse-normal rate and rhythm RS: breathing comfortably Extremities: normal  Neuro:Awake, alert, follows simple commands, spontaneously moving upper extremities in bed  Basic Metabolic Panel: Recent Labs  Lab 08/27/20 1958 08/28/20 0251 08/29/20 0045  NA 133* 133* 136  K 3.5 3.2* 3.5  CL 101 102 104  CO2 22 22 23   GLUCOSE 90 87 89  BUN 13 13 16   CREATININE 0.53 0.57 0.61  CALCIUM 9.6 9.1 9.0    CBC: Recent Labs  Lab 08/27/20 1958 08/28/20 0251  WBC 9.4 10.5  HGB 12.2 11.1*  HCT 37.7 32.4*  MCV 87.1 85.3  PLT 345 305     Coagulation Studies: No results for input(s): LABPROT, INR in the last 72 hours.  Imaging No new brain imaging overnight   ASSESSMENT AND PLAN: 20 year old female with history of epilepsy who presented with breakthrough seizures in the setting of discontinuation of AEDs  Epilepsy with breakthrough seizures -Etiology of breakthrough seizures: Discontinuation of Depakote.  Recommendations: -Patient's mother reports significant lethargy on Depakote.   Is already off of Depakote, had side effects and teratogenic potential, we will switch patient to a different AED.  Discussed about starting lamotrigine.  However will need to be on additional AED while slowly titrating lamotrigine to therapeutic levels.  Therefore will start patient on lamotrigine taper and bridge with Keppra  Lamotrigine Morning Evening  3/16-3/20 25mg    3/21-3/27 25mg  25mg   3/28 -4/3 50mg  25mg   4/4 - 4/10 50mg  50mg   4/11- 4/17 75mg  50mg   4/18-4/24 75mg  75mg    4/25-5/1 100mg  75mg   5/2 onwards 100mg  100mg     Keppra Morning Evening  3/16- 4/3 500mg  500mg   4/4-5/1 250mg   250 mg    -Intranasal Versed 5mg  for seizure lasting more than 5 minutes. Call your neurologist after the seizure if needed.  If patient has another seizure, please call 911.  If intranasal Versed is not covered by insurance, can also prescribe clonazepam 1mg  oral dissolving tablet  -Continue seizure precautions including do not drive -Discussed how to take the medication with patient's family in detail, read back completed.  Patient mother was able to understand instructions.  Seizure precautions: Per [facility] statutes, patients with seizures are not allowed to drive until they have been seizure-free for six months and cleared by a physician    Use caution when using heavy equipment or power tools. Avoid working on ladders or at heights. Take showers instead of baths. Ensure the water temperature is not too high on the home water heater. Do not go swimming alone. Do not lock yourself in a room alone (i.e. bathroom). When caring for infants or small children, sit down when holding, feeding, or changing them to minimize risk of injury to the child in the event you have a seizure. Maintain good sleep hygiene. Avoid alcohol.    If patient has another seizure, call 911 and bring them back to the ED if: A.  The seizure lasts longer than 5 minutes.      B.  The patient doesn't wake shortly after the seizure  or has new problems such as difficulty seeing, speaking or moving following the seizure C.  The patient was injured during the seizure D.  The patient has a temperature over 102 F (39C) E.  The patient vomited during the seizure and now is having trouble breathing    During the Seizure   - First, ensure adequate ventilation and place patients on the floor on their left side  Loosen clothing around the neck and ensure the airway is patent. If the patient is clenching  the teeth, do not force the mouth open with any object as this can cause severe damage - Remove all items from the surrounding that can be hazardous. The patient may be oblivious to what's happening and may not even know what he or she is doing. If the patient is confused and wandering, either gently guide him/her away and block access to outside areas - Reassure the individual and be comforting - Call 911. In most cases, the seizure ends before EMS arrives. However, there are cases when seizures may last over 3 to 5 minutes. Or the individual may have developed breathing difficulties or severe injuries. If a pregnant patient or a person with diabetes develops a seizure, it is prudent to call an ambulance. - Finally, if the patient does not regain full consciousness, then call EMS. Most patients will remain confused for about 45 to 90 minutes after a seizure, so you must use judgment in calling for help. - Avoid restraints but make sure the patient is in a bed with padded side rails - Place the individual in a lateral position with the neck slightly flexed; this will help the saliva drain from the mouth and prevent the tongue from falling backward - Remove all nearby furniture and other hazards from the area - Provide verbal assurance as the individual is regaining consciousness - Provide the patient with privacy if possible - Call for help and start treatment as ordered by the caregiver    After the Seizure (Postictal Stage)   After a seizure, most patients experience confusion, fatigue, muscle pain and/or a headache. Thus, one should permit the individual to sleep. For the next few days, reassurance is essential. Being calm and helping reorient the person is also of importance.   Most seizures are painless and end spontaneously. Seizures are not harmful to others but can lead to complications such as stress on the lungs, brain and the heart. Individuals with prior lung problems may develop labored  breathing and respiratory distress.    Thank you for allowing [place] to participate in the care of this patient.  Neurology will sign off.  Please call us for any further questions.  I have spent a total of 30   minutes with the patient reviewing hospital notes,  test results, labs and examining the patient as well as establishing an assessment and plan that was discussed personally with the patient and her mother, sister.  > 50% of time was spent in direct patient care.      Lindie Spruce Epilepsy [facility] For questions after 5pm please refer to AMION to reach the Neurologist on call

## 2020-08-29 NOTE — Plan of Care (Signed)
  Problem: Education: Goal: Knowledge of General Education information will improve Description: Including pain rating scale, medication(s)/side effects and non-pharmacologic comfort measures Outcome: Adequate for Discharge   

## 2020-08-29 NOTE — Discharge Summary (Addendum)
Name: Sara Moon MRN: [id number] DOB: 06/17/2000 20 y.o. PCP: Quincy Simmonds, MD  Date of Admission: 08/27/2020  7:36 PM Date of Discharge: 08/29/20 Attending Physician: Dr. Heide Spark  Discharge Diagnosis: Active Problems:   Seizure (HCC)   1. Seizure Disorder   Discharge Medications: Allergies as of 08/29/2020   No Known Allergies     Medication List    STOP taking these medications   divalproex 500 MG DR tablet Commonly known as: Depakote     TAKE these medications   ARIPiprazole 5 MG tablet Commonly known as: ABILIFY Take 1 tablet (5 mg total) by mouth daily.   lamoTRIgine 25 MG tablet Commonly known as: LAMICTAL Take 1 tablet (25 mg total) by mouth daily. Start taking on: August 30, 2020   levETIRAcetam 500 MG tablet Commonly known as: KEPPRA Take 1 tablet (500 mg total) by mouth 2 (two) times daily.   midazolam 5 MG/ML injection Commonly known as: VERSED Place 1 mL (5 mg total) into the nose once for 1 dose. Draw up prescribed dose (ml) in syringe, remove blue vial access device, then attach syringe to nasal atomizer for intranasal administration.   multivitamin with minerals Tabs tablet Take 1 tablet by mouth daily.   vitamin B-12 1000 MCG tablet Commonly known as: CYANOCOBALAMIN Take 1 tablet (1,000 mcg total) by mouth daily.       Disposition and follow-up:   SaraDelmar Moon was discharged from [facility] in Stable condition.  At the hospital follow up visit please address:  1.  Follow-up:  a.  Seizures-follow-up with neurology    b.  Major depression with psychosis-follow-up with psychology  2.  Labs / imaging needed at time of follow-up: CBC, BMP  3.  Pending labs/ test needing follow-up: None  4.  Medication Changes  Started: Lamictal 25mg  daily, Keppra 500mg  BID   Stopped: Depakote  Changed: None  Abx - None     Lamotrigine Morning Evening  3/16-3/20 25mg    3/21-3/27 25mg  25mg   3/28 -4/3 50mg  25mg   4/4 - 4/10  50mg  50mg   4/11- 4/17 75mg  50mg   4/18-4/24 75mg  75mg   4/25-5/1 100mg  75mg   5/2 onwards 100mg  100mg     Keppra Morning Evening  3/16- 4/3 500mg  500mg   4/4-5/1 250mg   250 mg    Follow-up Appointments:  Follow-up Information    , MD Follow up in 1 week(s).   Specialty: Internal Medicine Contact information: [address] [place] [address] [phone]               Hospital Course by problem list:   Seizure disorder Patient presented to the emergency room feeling fatigued with decreased energy.  She had recently stopped taking her Depakote due to side effects.  She was not started on any other antiseizure medications at this time.  She had multiple seizure episodes 4 days prior to her admission, and 7 on the day of her admission.  The episode lasted up to 8 minutes and consisted of patient clenching her fist shaking her right arm.  She also had redness in her face and frothing of her mouth.  Post seizure she complained of pain in her eyes as well as some heaviness.  No bowel or bladder incontinence occurred.  No fevers or chills.  No sick contacts.  No chest pain or palpitations.  In the ED she was given Depakote 750 mg IV and neurology was consulted who recommended continuing her on these medications.  The following day  the patient had an additional seizure episode.  Neurology was reconsulted, and they recommended Lamictal 25 mg daily as well as Keppra 500 mg twice daily.  They will taper off the Keppra and increase the doses of Lamictal.  This dosing is in the patient's discharge packet as well as this discharge summary.  She will follow up with neurology in the outpatient basis.  Major depressive disorder with psychosis Patient's major depressive order with psychosis is stable on her Abilify.  We will continue her on this medication at discharge.  She has follow-up with her psychiatry.  Also discussed if she has questions concerning medication she is always  welcome to call internal medicine center.  Discharge Subjective:  Sara Moon did not have any seizures overnight or this morning. She continues to feel well without any complaints. Mother notes she will need a note for her school and the government. She was informed to continue her home antipsychotic.   Discharge Exam:   BP (!) 98/50 (BP Location: Right Arm)   Pulse 82   Temp 98 F (36.7 C) (Oral)   Resp 20   Wt 56 kg   SpO2 97%   BMI 23.33 kg/m    General: Awake, alert, oriented x3, NAD CVs: Regular rate and rhythm, normal heart sounds Lungs: CTA bilaterally Extremities: No edema noted, nontender to palpation Psych: Flat affect HEENT: Normocephalic, atraumatic Skin: Warm and dry Neuro: Oriented x3, follows commands, has involuntary side-to-side head movements (per her mother has been present since childhood)   Pertinent Labs, Studies, and Procedures:  CBC Latest Ref Rng & Units 08/28/2020 08/27/2020 08/20/2020  WBC 4.0 - 10.5 K/uL 10.5 9.4 7.7  Hemoglobin 12.0 - 15.0 g/dL 11.1(L) 12.2 12.4  Hematocrit 36.0 - 46.0 % 32.4(L) 37.7 37.4  Platelets 150 - 400 K/uL 305 345 313    CMP Latest Ref Rng & Units 08/29/2020 08/28/2020 08/27/2020  Glucose 70 - 99 mg/dL 89 87 90  BUN 6 - 20 mg/dL 16 13 13   Creatinine 0.44 - 1.00 mg/dL 8.75 6.43  Sodium 135 - 145 mmol/L 136 133(L) 133(L)  Potassium 3.5 - 5.1 mmol/L 3.5 3.2(L) 3.5  Chloride 98 - 111 mmol/L 104 102 101  CO2 22 - 32 mmol/L 23 22 22   Calcium 8.9 - 10.3 mg/dL 9.0 9.1 9.6  Total Protein 6.5 - 8.1 g/dL - - 7.7  Total Bilirubin 0.3 - 1.2 mg/dL - - 0.7  Alkaline Phos 38 - 126 U/L - - 39  AST 15 - 41 U/L - - 18  ALT 0 - 44 U/L - - 17    No results found.   Discharge Instructions: Discharge Instructions    Call MD for:  difficulty breathing, headache or visual disturbances   Complete by: As directed    Call MD for:  extreme fatigue   Complete by: As directed    Call MD for:  hives   Complete by: As directed    Call  MD for:  persistant dizziness or light-headedness   Complete by: As directed    Call MD for:  persistant nausea and vomiting   Complete by: As directed    Call MD for:  redness, tenderness, or signs of infection (pain, swelling, redness, odor or green/yellow discharge around incision site)   Complete by: As directed    Call MD for:  severe uncontrolled pain   Complete by: As directed    Call MD for:  temperature >100.4   Complete by: As directed  Diet - low sodium heart healthy   Complete by: As directed    Discharge instructions   Complete by: As directed    Thank you for allowing [place] to care for you during your hospitalization.  You were treated for your seizure disorders.  We restarted you on a medication regimen please see the discharge instructions for the schedule.  If you have any questions about the schedule, please call the internal medicine center.  If you need further paperwork for Social Security, please call our office or your pyschologists and schedule report to be seen.   Increase activity slowly   Complete by: As directed       Signed: Belva Agee, MD 08/29/2020, 4:43 PM   Pager: [phone]

## 2020-08-31 LAB — ZINC: Zinc: 91 ug/dL (ref 44–115)

## 2020-09-02 ENCOUNTER — Telehealth: Payer: Self-pay | Admitting: Pediatric Intensive Care

## 2020-09-02 ENCOUNTER — Telehealth: Payer: Self-pay | Admitting: Diagnostic Neuroimaging

## 2020-09-02 ENCOUNTER — Telehealth: Payer: Self-pay | Admitting: *Deleted

## 2020-09-02 NOTE — Telephone Encounter (Signed)
Called Sara Moon, [facility] pharmacy to discuss seizure medication options. Nayzilam (midazolam) nasal spray or Valtoco (diazepam) nasal spray or Clonazepam ODT. Will send to Dr Marjory Lies.

## 2020-09-02 NOTE — Telephone Encounter (Signed)
Called [facility] outpatient pharmacy, spoke with Ulyses Jarred to clarify seizure  Rx from hospital. She stated they don't have any prescription. The teaching service was trying different medications to see what would be approved. She stated that there is no generic for Nayzalim for commercial use. She needs Rx for Nayzalim which will require a PA or she suggested Diazepam rectal gel which typically is approved by Medicaid. I advised will send to MD. She verbalized understanding, appreciation.

## 2020-09-02 NOTE — Telephone Encounter (Signed)
Call to [facility]. Spoke with French Ana. Intranasal versed is not available there so client was unable to receive this at time of discharge. Client did receive her lamictal and keppra prescriptions. Shann Medal RN BSN CNP [phone]

## 2020-09-02 NOTE — Telephone Encounter (Signed)
Per Dr Marjory Lies patient is now on keppra and  lamotrigine, will see her in follow up next Mon before ordering another medication as her seizures may be better controlled at that time. Encarnacion Slates, pharmacist, made her aware. She verbalized understanding, appreciation. Replied to staff message from congregational RN, Shann Medal to inform her.

## 2020-09-02 NOTE — Telephone Encounter (Signed)
Attempted to contact pt due to 3/28 appointment needing r/s (MD out of office) and was unable to reach her. R/s appointment for 09/30/20 at 2:30 with Dr. Marjory Lies.

## 2020-09-02 NOTE — Telephone Encounter (Signed)
Call to [facility] CM with [facility]. Client was discharged on 3/17 and was unable to get prescription for intranasal midazolam for seizure breakthrough. Marylene Land is meeting with client and her mother [name] this morning. CN asks Marylene Land to have Chad meet with this CN tomorrow at [name] for medication teaching and reinforcement. Shann Medal Rn BSN CNP [phone]

## 2020-09-02 NOTE — Telephone Encounter (Signed)
Call to Outpatient Pharmacy. Spoke with Helmut Muster PharmD. She will check on status of intranasal Versed and reply via email. She believes that the prescription is no longer in the system and that it will also require a PA to fill. Shann Medal RN BSN CNP [phone]

## 2020-09-03 ENCOUNTER — Other Ambulatory Visit: Payer: Self-pay | Admitting: *Deleted

## 2020-09-03 DIAGNOSIS — G40909 Epilepsy, unspecified, not intractable, without status epilepticus: Secondary | ICD-10-CM

## 2020-09-03 MED ORDER — LAMOTRIGINE 25 MG PO TABS: 25.0000 mg | ORAL_TABLET | Freq: Every day | ORAL | 0 refills | Status: DC

## 2020-09-03 MED ORDER — LEVETIRACETAM 500 MG PO TABS: 500.0000 mg | ORAL_TABLET | Freq: Two times a day (BID) | ORAL | 0 refills | Status: DC

## 2020-09-03 NOTE — Congregational Nurse Program (Signed)
Error-this is a duplicate chart  [name] RN BSn PCCN  [facility] [phone]-cell [phone]-office

## 2020-09-03 NOTE — Congregational Nurse Program (Signed)
Patient's mom reports patient had seizure 2 days ago and 2 BMs with blood yesterday (bright red). Wanted to confirm appointments for neurologist 09/30/20 at 2:30 and psychology 10/08/20 at 1130. Appointment with neurology confirmed with provider. Blood pressure taken, WNL. Patient and mom spoke with Shann Medal, RN regarding care plan.  [name] RN BSn PCCN  [facility] [phone]-cell [phone]-office

## 2020-09-04 ENCOUNTER — Telehealth: Payer: Self-pay | Admitting: *Deleted

## 2020-09-04 NOTE — Telephone Encounter (Signed)
CoverMyMeds Prior Authorization follow up was faxed to [facility] with Dr Feliz Beam name. Please advise where to send this, as she is not a [facility] patient. Thank you!  [phone]. Andree Coss, RN

## 2020-09-04 NOTE — Telephone Encounter (Signed)
Attempts to do PA for Nzyzilan.  Message and list of medications covered to be sent to the [facility] Team.  Angelina Ok, RN 09/04/2020 11:09 AM.

## 2020-09-08 NOTE — Progress Notes (Signed)
   CC: hospital follow-up  HPI:  Ms.Imo Lords is a 20 y.o. woman with history of seizure disorder, depression with psychosis, and vitamin B12 deficiency presents to clinic for follow-up after recent hospitalization 08/27/20-08/29/20 for seizures.   In-person Farsi interpretor used for this encounter. Patient initially interviewed independently and then, after obtaining patient's permission, together with her mother.   To see the details of this patient's management of their acute and chronic problems, please refer to the Assessment & Plan under the Encounters tab.    Past Medical History:  Diagnosis Date  . Concern for seizure disorder (HCC)   . Depression    Review of Systems:    Review of Systems  Constitutional: Negative for chills and fever.  Eyes: Positive for blurred vision. Negative for pain.  Respiratory: Negative for shortness of breath.   Cardiovascular: Positive for palpitations. Negative for chest pain.  Musculoskeletal: Negative for myalgias.  Neurological: Positive for tingling, sensory change and focal weakness. Negative for dizziness, seizures and headaches.  Psychiatric/Behavioral: Negative for depression.    Physical Exam:  Vitals:   09/09/20 1516  BP: (!) 105/55  Pulse: 79  Temp: 98.7 F (37.1 C)  TempSrc: Oral  SpO2: 99%  Weight: 119 lb 11.2 oz (54.3 kg)  Height: 5\' 5"  (1.651 m)   Constitutional: anxious- though well-appearing woman sitting in chair, in no acute distress, swaying her upper body side to side throughout evaluation HENT: normocephalic atraumatic, mucous membranes moist Eyes: conjunctiva non-erythematous Neck: supple Cardiovascular: regular rate and rhythm, no m/r/g Pulmonary/Chest: normal work of breathing on room air, lungs clear to auscultation bilaterally Abdominal: soft, non-distended MSK: normal bulk and tone Neurological: alert & oriented x 3; PERRL; EOM intact; confrontational visual fields intact; 5/5 strength in bilateral  lower extremities with intact sensation; normal gait Skin: warm and dry Psych: anxious affect; normal mood; denies auditory or visual hallucinations; no SI or HI    Assessment & Plan:   See Encounters Tab for problem-based charting.  Patient discussed with Dr. 

## 2020-09-08 NOTE — Patient Instructions (Addendum)
Sara Moon,   Thank you for your visit to the [facility] today. It was a pleasure meeting you. Today we discussed the following:  1) Seizures - Continue your medications as you were instructed in the hospital - Follow-up with the neurologist and psychologist - Be sure to discuss the vision problems and weakness you have been experiencing - I am getting blood work - CBC, BMP, and thyroid labs - I will call with the result - I am referring to an eye doctor   Schedule a follow-up appointment with your primary care doctor. Please bring all of your medications with you.   If you have any questions or concerns, please call our clinic at [phone] between 9am-5pm. Outside of these hours, call [phone] and ask for the internal medicine resident on call. If you feel you are having a medical emergency please call 911.    ?    ?~   ~??~ ? ? ~ ? ~? ~?.      .     ?  ~?:  1)  - ?      ?  ? - ???        -    ~ ???  ? ~  ~ ?  ~? -   ? ? - ? CBC BMP  ?? -  ?  ? ? -    ~  ? ~   ?~   ?  ~  ? ?  ?? ~?.   ?     ?.     ? ? ?    9   5     ~??~    [phone]  ??.   ?    [phone]  ??   ? ?   ?.   ? ~? ~  ?~ Z ~? ?   911  ??.

## 2020-09-09 ENCOUNTER — Ambulatory Visit (INDEPENDENT_AMBULATORY_CARE_PROVIDER_SITE_OTHER): Payer: Medicaid Other | Admitting: Student

## 2020-09-09 ENCOUNTER — Ambulatory Visit: Payer: Medicaid Other | Admitting: Diagnostic Neuroimaging

## 2020-09-09 ENCOUNTER — Encounter: Payer: Self-pay | Admitting: Student

## 2020-09-09 VITALS — BP 105/55 | HR 79 | Temp 98.7°F | Ht 65.0 in | Wt 119.7 lb

## 2020-09-09 DIAGNOSIS — F32A Depression, unspecified: Secondary | ICD-10-CM | POA: Diagnosis not present

## 2020-09-09 DIAGNOSIS — G40909 Epilepsy, unspecified, not intractable, without status epilepticus: Secondary | ICD-10-CM | POA: Diagnosis present

## 2020-09-09 NOTE — Assessment & Plan Note (Addendum)
Hospitalized with psychosis in Feb 2022. During recent admission for seizures 08/27/20-08/29/20, patient's depression stable on aripiprazole 5 mg daily. Today, patient reports her mood is "good," denies auditory or visual hallucinations, SI, or HI. During my evaluation, patient does appear anxious and sways side to side throughout the encounter. Eyes with mild proptosis. Given this and report of heart palpitations/racing, will re-screen for hyperthyroidism as well.  She is scheduled for follow-up with psych on 10/08/20.  Plan: - continue aripiprazole 5 mg daily - TSH and free T4 --> wnl, TSH 1.050, free T4 1.55 - psych follow-up 10/08/20

## 2020-09-09 NOTE — Assessment & Plan Note (Addendum)
Patient presents for hospital follow-up after admission 08/27/20-08/01/20 with increased seizure activity after being told to stop her Depakote (psych had concern medication may be contributing to psychiatric symptoms). She was seen by neurology during admission who recommended Lamictal 25 mg daily as well as Keppra 500 mg twice daily. Per neurology, patient was given dosing schedule for Keppra and Lamictal with ultimate goal of tapering off of Keppra and increasing Lamictal. The patient's mother has been managing her medications and today shows me the schedule she is following. The patient is scheduled to follow-up with neurology on 09/30/20.   Since discharge, the patient and her mother report no recurrent seizure-like activity. The patient does express concern that she is "losing [her] vision" and has episodes of leg weakness and numbness. Regarding her visual complaints, she states she has episodes of "losing vision" where she has trouble seeing things in front of her and has eye "heaviness." She denies current visual complaints. Endorses associated flashes of light. When asked whether vision is blurry or if there are whole areas of vision missing, the patient is unable to clarify further. She has never formally had her eyes examined by an optometrist or ophthalmologist.   Regarding her leg weakness and numbness, she states the symptoms also occur episodically but not necessarily at the same time as the visual symptoms. She denies weakness or numbness in the legs today. Reports adherence to her vitamin B12 supplementation. Also endorses occasional heart palpitations but no chest pain or shortness of breath.   Assessment/Plan: Ms. Koffler and her mother report adherence to her anti-epileptic regimen as prescribed by neurology during recent admission. Reassuringly no report of seizures since discharge. However, patient's report of episodic visual symptoms and separate episodic weakness (neither present on  evaluation today) are concerning for possible seizure activity. Counseled patient and her mother to bring up at upcoming neurology follow-up. Will refer to ophthalmology. Will also repeat TSH (low-normal at last check 1 month ago) and obtain free T4 to screen for possible thyroid dysfunction.  - continue AED schedule as instructed by neurology - neurology follow-up scheduled for 09/30/20 - referral to ophthalmology - CBC, BMP --> Hgb 12, BMP unremarkable - TSH and free T4 --> wnl at 1.050 and 1.55

## 2020-09-10 ENCOUNTER — Other Ambulatory Visit: Payer: Self-pay | Admitting: Neurology

## 2020-09-10 ENCOUNTER — Telehealth: Payer: Self-pay | Admitting: Pediatric Intensive Care

## 2020-09-10 ENCOUNTER — Telehealth: Payer: Self-pay | Admitting: *Deleted

## 2020-09-10 DIAGNOSIS — G40909 Epilepsy, unspecified, not intractable, without status epilepticus: Secondary | ICD-10-CM

## 2020-09-10 LAB — CBC WITH DIFFERENTIAL/PLATELET
Basophils Absolute: 0.1 10*3/uL (ref 0.0–0.2)
Basos: 1 %
EOS (ABSOLUTE): 0.1 10*3/uL (ref 0.0–0.4)
Eos: 2 %
Hematocrit: 37.4 % (ref 34.0–46.6)
Hemoglobin: 12 g/dL (ref 11.1–15.9)
Immature Grans (Abs): 0 10*3/uL (ref 0.0–0.1)
Immature Granulocytes: 0 %
Lymphocytes Absolute: 1.6 10*3/uL (ref 0.7–3.1)
Lymphs: 23 %
MCH: 27.8 pg (ref 26.6–33.0)
MCHC: 32.1 g/dL (ref 31.5–35.7)
MCV: 87 fL (ref 79–97)
Monocytes Absolute: 0.6 10*3/uL (ref 0.1–0.9)
Monocytes: 8 %
Neutrophils Absolute: 4.6 10*3/uL (ref 1.4–7.0)
Neutrophils: 66 %
Platelets: 350 10*3/uL (ref 150–450)
RBC: 4.32 x10E6/uL (ref 3.77–5.28)
RDW: 13.1 % (ref 11.7–15.4)
WBC: 6.9 10*3/uL (ref 3.4–10.8)

## 2020-09-10 LAB — BMP8+ANION GAP
Anion Gap: 17 mmol/L (ref 10.0–18.0)
BUN/Creatinine Ratio: 25 — ABNORMAL HIGH (ref 9–23)
BUN: 14 mg/dL (ref 6–20)
CO2: 21 mmol/L (ref 20–29)
Calcium: 9.6 mg/dL (ref 8.7–10.2)
Chloride: 102 mmol/L (ref 96–106)
Creatinine, Ser: 0.56 mg/dL — ABNORMAL LOW (ref 0.57–1.00)
Glucose: 102 mg/dL — ABNORMAL HIGH (ref 65–99)
Potassium: 4.4 mmol/L (ref 3.5–5.2)
Sodium: 140 mmol/L (ref 134–144)
eGFR: 133 mL/min/{1.73_m2} (ref >59)

## 2020-09-10 LAB — T4, FREE: Free T4: 1.55 ng/dL (ref 0.82–1.77)

## 2020-09-10 LAB — TSH: TSH: 1.05 u[IU]/mL (ref 0.450–4.500)

## 2020-09-10 MED ORDER — LAMOTRIGINE 25 MG PO TABS
ORAL_TABLET | ORAL | 1 refills | Status: DC
Start: 2020-09-10 — End: 2020-09-10

## 2020-09-10 MED FILL — levETIRAcetam 500 MG TABS: 500 | 15 days supply | Qty: 30 | Fill #0

## 2020-09-10 MED FILL — lamoTRIgine 25 MG TABS: 25 | 30 days supply | Qty: 150 | Fill #0

## 2020-09-10 NOTE — Telephone Encounter (Signed)
From 2nd phone note dated today on same subject: Called [place] who stated the lamictal Rx on file needs to be discontineud. A new Rx needs to be sent for medicare to fill. The drug has been tapered to 50 mg in morning, 25 mg at night.  She stated the mother was given a taper schedule when patient left hospital.  [place] will e mail copy of taper schedule to ne. She is aware Dr Marjory Lies is out of office, will be sent to work in [place].

## 2020-09-10 NOTE — Telephone Encounter (Signed)
Received call from Lynne Leader [facility] pharmacy asking if Rx for levertiracetam was intended to be for 2 weeks. I advised her it was because patient originally had FU on 09/09/20 at which time medications would be discussed. Marland Kitchen However it had to be rescheduled to 09/30/20. Lanora Manis stated she will refill x 2 weeks and have patient call a few days before she runs out for a refill.

## 2020-09-10 NOTE — Addendum Note (Signed)
Addended by: York Spaniel on: 09/10/2020 03:29 PM   Modules accepted: Orders

## 2020-09-10 NOTE — Telephone Encounter (Signed)
Refer to other phone note dated 09/10/20 from Shann Medal, Charity fundraiser.

## 2020-09-10 NOTE — Telephone Encounter (Signed)
I have sent a prescription to gradually increase the Lamictal.

## 2020-09-10 NOTE — Telephone Encounter (Signed)
Called [place] who stated the lamictal Rx on file needs to be discontineud. A new Rx needs to be sent for medicare to fill. The drug has been tapered to 50 mg in morning, 25 mg at night.  She stated the mother was given a taper schedule when patient left hospital.  [place] will e mail copy of taper schedule to ne. She is aware Dr Marjory Lies is out of office, will be sent to work in [place].

## 2020-09-10 NOTE — Telephone Encounter (Signed)
[place]Called [place] re: levertiracetam ,lamictal Rx. No answer.

## 2020-09-10 NOTE — Telephone Encounter (Signed)
Call to Monico Hoar at [facility]. CN advised that she had received a message that client will be out of both keppra and lamictal tonight. Keppra can be refilled but lamictal will need a new prescription from provider as it is on a taper increase. Helmut Muster advised she will staff message this CN and Dr Richrd Humbles nurse at [facility] regarding need for another lamictal prescription. Shann Medal RN BSN CNP [phone]

## 2020-09-10 NOTE — Progress Notes (Signed)
Internal Medicine Clinic Attending  Case discussed with Dr. Watson  At the time of the visit.  We reviewed the resident's history and exam and pertinent patient test results.  I agree with the assessment, diagnosis, and plan of care documented in the resident's note.  

## 2020-09-10 NOTE — Telephone Encounter (Signed)
Turkey from [employer] is asking for a call back from [facility] . Please call her at [phone]

## 2020-09-18 ENCOUNTER — Telehealth: Payer: Self-pay | Admitting: Pediatric Intensive Care

## 2020-09-18 NOTE — Telephone Encounter (Signed)
Call to [facility]- client has 4/26 behavioral health appointment but client's mother does not know where. Appointment confirmed with clinic for 1130 4/26. Shann Medal RN BSN CNP [phone]

## 2020-09-25 ENCOUNTER — Telehealth: Payer: Self-pay | Admitting: Pediatric Intensive Care

## 2020-09-25 ENCOUNTER — Other Ambulatory Visit (HOSPITAL_COMMUNITY): Payer: Self-pay

## 2020-09-25 MED FILL — Aripiprazole Tab 5 MG: ORAL | 30 days supply | Qty: 30 | Fill #0 | Status: AC

## 2020-09-25 NOTE — Telephone Encounter (Signed)
Call to Outpatient pharmacy to refill client's Abilify. Shann Medal Rn BSN CNP [phone]

## 2020-09-25 NOTE — Progress Notes (Signed)
This encounter was created in error - please disregard.

## 2020-09-26 ENCOUNTER — Other Ambulatory Visit (HOSPITAL_COMMUNITY): Payer: Self-pay

## 2020-09-30 ENCOUNTER — Encounter: Payer: Self-pay | Admitting: Diagnostic Neuroimaging

## 2020-09-30 ENCOUNTER — Ambulatory Visit (INDEPENDENT_AMBULATORY_CARE_PROVIDER_SITE_OTHER): Payer: Medicaid Other | Admitting: Diagnostic Neuroimaging

## 2020-09-30 DIAGNOSIS — G40909 Epilepsy, unspecified, not intractable, without status epilepticus: Secondary | ICD-10-CM

## 2020-09-30 MED ORDER — LAMOTRIGINE 25 MG PO TABS: ORAL_TABLET | ORAL | 1 refills | Status: DC

## 2020-09-30 MED ORDER — LEVETIRACETAM 250 MG PO TABS: 250.0000 mg | ORAL_TABLET | Freq: Two times a day (BID) | ORAL | 1 refills | Status: DC

## 2020-09-30 NOTE — Progress Notes (Signed)
[facility]  PATIENT: Sara Moon DOB: Nov 24, 2000  REFERRING CLINICIAN: Quincy Simmonds, MD HISTORY FROM: patient  REASON FOR VISIT: follow up   HISTORICAL  CHIEF COMPLAINT:  Chief Complaint  Patient presents with  . Seizures    Rm 6, 3 month FU Mom- [id number], interpreter-  Hameed Safi  "doing 80% better"    HISTORY OF PRESENT ILLNESS:   UPDATE (09/30/20, VRP): Since last visit, had complicated course with psychosis, mood swings, weakness, then stopped depakote in 08/20/20 by psychiatry for weakness. Then had breakthrough seizures. Had VEEG in March 2022, with abnl discharges, now on LTG + LEV titration and doing better. Rare 30 episodes of flushing. No alleviating or aggravating factors. Tolerating meds. Now on abilify for psychiatry and doing well. Last major seizures on 08/27/20.  PRIOR HPI: 20 year old female here for evaluation of abnormal spells.  Patient accompanied by mother, Nurse, learning disability, and sponsor.  Patient was born in [place] in 2002, with normal uncomplicated birth.  However she had developmental delay and did not start talking or walking until age 75 years old.  Even at that time her language abilities were limited.  Eventually she was able to attend school and complete through 11th grade.  However she only attained seventh grade ability.    At age 21 years old patient was having some abnormal spells of eye blinking, freezing, falling down.  Her family took her to [place] for evaluation and patient had MRI, EEG and neurology evaluation.  No specific diagnosis was made but patient was started on a medication for about 6 months which seems to help.  Mother thinks this might have been an antiseizure medication but is not sure.  Patient returned to [place], completed medication for 6 months and then stopped it.  At age 81 years old patient had another evaluation in [place] at a different hospital, with MRI and EEG.  At that time she was diagnosed with a small  arachnoid cyst, which is thought to be an incidental finding.  She was again started on some type of medication for about 6 months, possibly antiseizure medication.  She was recommended to follow-up, but could not because of Covid pandemic.  Patient came to the [place] as a refugee, initially to [place] and then to [place].  She saw internal medicine clinic in [place], was started on levetiracetam but this caused some side effect and abnormal movements.  Medication was stopped and patient referred here for further evaluation.  Patient having at least two episodes per day of memory lapse, abnormal involuntary movements, almost falling down, falling down, confusion.  Patient had several of these episodes during her evaluation today, with abnormal flinging movements of her arm, almost falling down but then catching herself, without confusion or memory lapse.  These did not seem like seizure events but more like behavioral spells.   REVIEW OF SYSTEMS: Full 14 system review of systems performed and negative with exception of: As per HPI.  ALLERGIES: No Known Allergies  HOME MEDICATIONS: Outpatient Medications Prior to Visit  Medication Sig Dispense Refill  . ARIPiprazole (ABILIFY) 5 MG tablet TAKE 1 TABLET (5 MG TOTAL) BY MOUTH DAILY. 30 tablet 2  . Multiple Vitamin (MULTIVITAMIN WITH MINERALS) TABS tablet Take 1 tablet by mouth daily. 30 tablet 0  . Multiple Vitamin (MULTIVITAMIN) capsule Take 1 capsule by mouth daily.    . vitamin B-12 (CYANOCOBALAMIN) 1000 MCG tablet TAKE 1 TABLET (1,000 MCG TOTAL) BY MOUTH DAILY. 30 tablet 0  . lamoTRIgine (  LAMICTAL) 25 MG tablet WK1: TAKE 2 TABLETS IN THE MORNING & 1 IN THE EVENING. WK2: TAKE 2 TABS TWICE DAILY. WK3: TAKE 3 TABS IN THE MORNING & 2 IN THE EVENING. 150 tablet 1  . levETIRAcetam (KEPPRA) 500 MG tablet TAKE 1 TABLET (500 MG TOTAL) BY MOUTH 2 (TWO) TIMES DAILY. 30 tablet 0  . ARIPiprazole (ABILIFY) 5 MG tablet TAKE 1 TABLET (5 MG  TOTAL) BY MOUTH DAILY. 30 tablet 0  . divalproex (DEPAKOTE) 500 MG DR tablet TAKE 2 TABLETS (1,000 MG TOTAL) BY MOUTH AT BEDTIME. (Patient not taking: Reported on 09/30/2020) 60 tablet 2  . lamoTRIgine (LAMICTAL) 25 MG tablet TAKE 1 TABLET (25 MG TOTAL) BY MOUTH DAILY. (Patient not taking: Reported on 09/30/2020) 30 tablet 0  . levETIRAcetam (KEPPRA) 500 MG tablet TAKE 1 TABLET BY MOUTH TWICE DAILY. (Patient not taking: Reported on 09/30/2020) 30 tablet 0  . midazolam (VERSED) 5 MG/ML injection Place 1 mL (5 mg total) into the nose once for 1 dose. Draw up prescribed dose (ml) in syringe, remove blue vial access device, then attach syringe to nasal atomizer for intranasal administration. 2 mL 0  . vitamin B-12 (CYANOCOBALAMIN) 1000 MCG tablet Take 1 tablet (1,000 mcg total) by mouth daily. 30 tablet 0   No facility-administered medications prior to visit.    PAST MEDICAL HISTORY: Past Medical History:  Diagnosis Date  . Concern for seizure disorder (HCC)   . Depression     PAST SURGICAL HISTORY: Past Surgical History:  Procedure Laterality Date  . APPENDECTOMY  2008    FAMILY HISTORY: Family History  Problem Relation Age of Onset  . Hypertension Other     SOCIAL HISTORY: Social History   Socioeconomic History  . Marital status: Single    Spouse name: Not on file  . Number of children: 0  . Years of education: Not on file  . Highest education level: Not on file  Occupational History  . Not on file  Tobacco Use  . Smoking status: Never Smoker  . Smokeless tobacco: Never Used  Vaping Use  . Vaping Use: Never used  Substance and Sexual Activity  . Alcohol use: Never  . Drug use: Never  . Sexual activity: Never    Birth control/protection: None  Other Topics Concern  . Not on file  Social History Narrative   Lives with mother and father and older brother in [place]. Sixth of seven children. Other siblings in refugee camps across [place] and [place] anticipate will be joining  family soon. Currently taking English classes    Social Determinants of Health   Financial Resource Strain: Not on file  Food Insecurity: Not on file  Transportation Needs: Not on file  Physical Activity: Not on file  Stress: Not on file  Social Connections: Not on file  Intimate Partner Violence: Not on file     PHYSICAL EXAM  GENERAL EXAM/CONSTITUTIONAL: Vitals:  Vitals:   09/30/20 1421  BP: (!) 102/55  Pulse: 72  Weight: 123 lb (55.8 kg)  Height: 5\' 5"  (1.651 m)   Body mass index is 20.47 kg/m. Wt Readings from Last 3 Encounters:  09/30/20 123 lb (55.8 kg)  09/09/20 119 lb 11.2 oz (54.3 kg)  08/27/20 123 lb 7.3 oz (56 kg)    Patient is in no distress; well developed, nourished and groomed; neck is supple  CARDIOVASCULAR:  Examination of carotid arteries is normal; no carotid bruits  Regular rate and rhythm, no murmurs  Examination of peripheral  vascular system by observation and palpation is normal  EYES:  Ophthalmoscopic exam of optic discs and posterior segments is normal; no papilledema or hemorrhages No exam data present  MUSCULOSKELETAL:  Gait, strength, tone, movements noted in Neurologic exam below  NEUROLOGIC: MENTAL STATUS:  No flowsheet data found.  awake, alert, oriented to person, place and time  recent and remote memory intact  normal attention and concentration  language fluent, comprehension intact, naming intact  fund of knowledge appropriate  CRANIAL NERVE:   2nd - no papilledema on fundoscopic exam  2nd, 3rd, 4th, 6th - pupils equal and reactive to light, visual fields full to confrontation, extraocular muscles intact, no nystagmus  5th - facial sensation symmetric  7th - facial strength symmetric  8th - hearing intact  9th - palate elevates symmetrically, uvula midline  11th - shoulder shrug symmetric  12th - tongue protrusion midline  MOTOR:   normal bulk and tone, full strength in the BUE, BLE  SENSORY:    normal and symmetric to light touch, temperature, vibration  COORDINATION:   finger-nose-finger, fine finger movements normal  REFLEXES:   deep tendon reflexes present and symmetric  GAIT/STATION:   narrow based gait     DIAGNOSTIC DATA (LABS, IMAGING, TESTING) - I reviewed patient records, labs, notes, testing and imaging myself where available.  Lab Results  Component Value Date   WBC 6.9 09/09/2020   HGB 12.0 09/09/2020   HCT 37.4 09/09/2020   MCV 87 09/09/2020   PLT 350 09/09/2020      Component Value Date/Time   NA 140 09/09/2020 1702   K 4.4 09/09/2020 1702   CL 102 09/09/2020 1702   CO2 21 09/09/2020 1702   GLUCOSE 102 (H) 09/09/2020 1702   GLUCOSE 89 08/29/2020 0045   BUN 14 09/09/2020 1702   CREATININE 0.56 (L) 09/09/2020 1702   CALCIUM 9.6 09/09/2020 1702   PROT 7.7 08/27/2020 1958   PROT 7.4 08/20/2020 1053   ALBUMIN 4.6 08/27/2020 1958   ALBUMIN 5.0 08/20/2020 1053   AST 18 08/27/2020 1958   ALT 17 08/27/2020 1958   ALKPHOS 39 08/27/2020 1958   BILITOT 0.7 08/27/2020 1958   BILITOT 0.4 08/20/2020 1053   GFRNONAA >60 08/29/2020 0045   GFRAA 151 07/25/2020 1020   Lab Results  Component Value Date   CHOL 152 08/02/2020   HDL 54 08/02/2020   LDLCALC 85 08/02/2020   TRIG 64 08/02/2020   CHOLHDL 2.8 08/02/2020   Lab Results  Component Value Date   HGBA1C 5.3 08/02/2020   Lab Results  Component Value Date   VITAMINB12 627 08/28/2020   Lab Results  Component Value Date   TSH 1.050 09/09/2020    07/25/20  MRI brain  1. No acute brain finding. 2. Few small foci of T2 and FLAIR signal with the hemispheric white matter, most noticeable adjacent to the posterior body of the left lateral ventricle. These are nonspecific and could be chronic insults, even related to prenatal or perinatal insult. There is some potential that this could represent the earliest manifestation of demyelinating disease/multiple sclerosis. 3. 12 x 24 mm  arachnoid cyst at the right posterior frontal convexity, not likely of any clinical significance.  08/05/20 EEG - This study showed evidence ofindependentepileptogenicity arising from left and right frontotemporal region as well asmoderate diffuse encephalopathy, nonspecific etiology.No seizures were seen throughout the recording.    ASSESSMENT AND PLAN  20 y.o. year old female here with developmental delay, possible seizure disorder, abnormal  behavior spells, depression and PTSD.  Dx:  1. Seizure disorder (HCC)     PLAN:  developmental delay + seizure disorder + behavior spells - continue lamotrigine 75mg  twice a day + levetiracetam 250mg  twice a day  - continue schedule to transition to lamotrigine 100mg  twice a day by 10/14/20 (then would have stopped levetiracetam by then)  Lamotrigine Morning Evening  3/16-3/20 25mg    3/21-3/27 25mg  25mg   3/28 -4/3 50mg  25mg   4/4 - 4/10 50mg  50mg   4/11- 4/17 75mg  50mg   4/18-4/24 75mg  75mg   4/25-5/1 100mg  75mg   5/2 onwards 100mg  100mg    Keppra Morning Evening  3/16- 4/3 500mg  500mg   4/4-5/1 250mg   250 mg    depression / PTSD - follow up with psychiatry (continue abilify)  Meds ordered this encounter  Medications  . lamoTRIgine (LAMICTAL) 25 MG tablet    Sig: Take as directed, gradually increase to 100mg  twice a day by Oct 14, 2020    Dispense:  100 tablet    Refill:  1  . levETIRAcetam (KEPPRA) 250 MG tablet    Sig: Take 1 tablet (250 mg total) by mouth 2 (two) times daily.    Dispense:  30 tablet    Refill:  1   Return in about 1 month (around 10/30/2020).    Suanne MarkerVIKRAM R. Dariella Gillihan, MD 09/30/2020, 3:11 PM Certified in Neurology, Neurophysiology and Neuroimaging  [facility] [address], [address] [place], [place] [address] [phone]

## 2020-09-30 NOTE — Patient Instructions (Signed)
developmental delay / seizure disorder / behavior spells - continue lamotrigine 75mg  twice a day + levetiracetam 250mg  twice a day  - continue schedule to transition to lamotrigine 100mg  twice a day by 10/14/20 (then would have stopped levetiracetam by then)  depression / PTSD - follow up with psychiatry (continue abilify)

## 2020-10-02 ENCOUNTER — Other Ambulatory Visit: Payer: Self-pay

## 2020-10-02 NOTE — Progress Notes (Signed)
Client`s mother came to see me. She would like to know  the date and time of next  appointment with [facility]. Appointment is scheduled for 10/08/20 at 11:30. Same written down for client`s mother.  [name] RN BSn PCCN  [facility] [phone]-cell [phone]

## 2020-10-08 ENCOUNTER — Telehealth: Payer: Self-pay | Admitting: Pediatric Intensive Care

## 2020-10-08 ENCOUNTER — Encounter (HOSPITAL_COMMUNITY): Payer: Self-pay | Admitting: Psychiatry

## 2020-10-08 ENCOUNTER — Ambulatory Visit (INDEPENDENT_AMBULATORY_CARE_PROVIDER_SITE_OTHER): Payer: Medicaid Other | Admitting: Psychiatry

## 2020-10-08 ENCOUNTER — Other Ambulatory Visit: Payer: Self-pay

## 2020-10-08 VITALS — BP 100/76 | HR 79 | Wt 123.0 lb

## 2020-10-08 DIAGNOSIS — F331 Major depressive disorder, recurrent, moderate: Secondary | ICD-10-CM

## 2020-10-08 DIAGNOSIS — F411 Generalized anxiety disorder: Secondary | ICD-10-CM | POA: Diagnosis not present

## 2020-10-08 MED ORDER — ARIPIPRAZOLE 5 MG PO TABS: ORAL_TABLET | Freq: Every day | ORAL | 2 refills | Status: DC

## 2020-10-08 NOTE — Telephone Encounter (Signed)
Return call from client's mother. States that client had two seizures yesterday- second seizure lasted approximately 4 minutes. Client had prolonged postictal period with no memory of seizure. Mother states that she is consistent with medication, has not missed any medication and is following the schedule. Mother states client has had 1-2 very short seizures over the last 4 days. CN will send call to [facility] for review. Shann Medal RN BSN CNP [phone]

## 2020-10-08 NOTE — Telephone Encounter (Signed)
Unable to leave VM for client's mother. Shann Medal RN BSN CNP [phone]

## 2020-10-08 NOTE — Telephone Encounter (Signed)
Noted. Continue to follow lamictal / levetiracetam titration schedule. -VRP

## 2020-10-08 NOTE — Telephone Encounter (Signed)
Called [place] RN and advised her of Dr Richrd Humbles recommendation. She will let mother know,  verbalized understanding, appreciation.

## 2020-10-08 NOTE — Progress Notes (Signed)
BH MD/PA/NP OP Progress Note  10/08/2020 12:33 PM Sara Moon  MRN:  [id number]  Chief Complaint:  Chief Complaint    Medication Management     HPI: 20 year old female seen today for follow up psychiatric evaluation. She has a psychiatric history of psychotic disorder, psycho organic syndrome, and depression.  She is currently managed on Abilify 5 mg and Lamictal 100 mg twice daily (prescribed by her neurologist).  She notes her medications are somewhat effective in managing her psychiatric conditions.  Today provider utilized an interpreter as patient speaks [place].  Today she is pleasant, calm, cooperative, restless (rocking which her mother notes she has done since childhood) and engaged in conversation.  She informed provider that since her last visit she has been feeling somewhat better.  She however notes that she continues to be anxious and depressed most days.  Provider conducted a GAD-7 and patient scored a 17, at her last visit she scored a 9.  Provider also conducted a PHQ-9 and patient scored a 13, at her last visit she scored a 9.  Patient notes that she at times has AH which she notes happens infrequently and generally at night.  She notes that she hears her name being called.  She endorses having a good appetite and adequate sleep.  Patient notes at times she becomes irritated and overwhelmed.  She notes that when this occurs she becomes hyperverbal to get her emotions out.  Today she denies SI/HI/VH, mania, or paranoia.  Provider recommended starting an SSRI such as Prozac to help manage symptoms of anxiety and depression.  Patient however notes that she does not want to take any new medications because she is on a lot of them currently.  She will follow-up with provider in 2 months for further assessment.  No other concerns noted at this time.   Visit Diagnosis:    ICD-10-CM   1. Moderate episode of recurrent major depressive disorder (HCC)  F33.1   2. Generalized anxiety  disorder  F41.1     Past Psychiatric History: psychotic disorder, psycho organic syndrome, and depression.  Past Medical History:  Past Medical History:  Diagnosis Date  . Concern for seizure disorder (HCC)   . Depression     Past Surgical History:  Procedure Laterality Date  . APPENDECTOMY  2008    Family Psychiatric History: Denies  Family History:  Family History  Problem Relation Age of Onset  . Hypertension Other     Social History:  Social History   Socioeconomic History  . Marital status: Single    Spouse name: Not on file  . Number of children: 0  . Years of education: Not on file  . Highest education level: Not on file  Occupational History  . Not on file  Tobacco Use  . Smoking status: Never Smoker  . Smokeless tobacco: Never Used  Vaping Use  . Vaping Use: Never used  Substance and Sexual Activity  . Alcohol use: Never  . Drug use: Never  . Sexual activity: Never    Birth control/protection: None  Other Topics Concern  . Not on file  Social History Narrative   Lives with mother and father and older brother in [place]. Sixth of seven children. Other siblings in refugee camps across [place] and [place] anticipate will be joining family soon. Currently taking English classes    Social Determinants of Health   Financial Resource Strain: Not on file  Food Insecurity: Not on file  Transportation Needs: Not on file  Physical Activity: Not on file  Stress: Not on file  Social Connections: Not on file    Allergies: No Known Allergies  Metabolic Disorder Labs: Lab Results  Component Value Date   HGBA1C 5.3 08/02/2020   MPG 105.41 08/02/2020   No results found for: PROLACTIN Lab Results  Component Value Date   CHOL 152 08/02/2020   TRIG 64 08/02/2020   HDL 54 08/02/2020   CHOLHDL 2.8 08/02/2020   VLDL 13 08/02/2020   LDLCALC 85 08/02/2020   Lab Results  Component Value Date   TSH 1.050 09/09/2020   TSH 0.406 08/02/2020    Therapeutic Level  Labs: No results found for: LITHIUM Lab Results  Component Value Date   VALPROATE 37 (L) 08/28/2020   VALPROATE 66 08/20/2020   No components found for:  CBMZ  Current Medications: Current Outpatient Medications  Medication Sig Dispense Refill  . ARIPiprazole (ABILIFY) 5 MG tablet TAKE 1 TABLET (5 MG TOTAL) BY MOUTH DAILY. 30 tablet 2  . lamoTRIgine (LAMICTAL) 25 MG tablet Take as directed, gradually increase to 100mg  twice a day by Oct 14, 2020 100 tablet 1  . levETIRAcetam (KEPPRA) 250 MG tablet Take 1 tablet (250 mg total) by mouth 2 (two) times daily. 30 tablet 1  . Multiple Vitamin (MULTIVITAMIN WITH MINERALS) TABS tablet Take 1 tablet by mouth daily. 30 tablet 0  . Multiple Vitamin (MULTIVITAMIN) capsule Take 1 capsule by mouth daily.    . vitamin B-12 (CYANOCOBALAMIN) 1000 MCG tablet TAKE 1 TABLET (1,000 MCG TOTAL) BY MOUTH DAILY. 30 tablet 0   No current facility-administered medications for this visit.     Musculoskeletal: Strength & Muscle Tone: within normal limits Gait & Station: normal Patient leans: N/A  Psychiatric Specialty Exam: Review of Systems  Blood pressure 100/76, pulse 79, weight 123 lb (55.8 kg), unknown if currently breastfeeding.Body mass index is 20.47 kg/m.  General Appearance: Well Groomed  Eye Contact:  Good  Speech:  Clear and Coherent and Normal Rate  Volume:  Normal  Mood:  Anxious and Depressed  Affect:  Appropriate and Congruent  Thought Process:  Coherent, Goal Directed and Linear  Orientation:  Full (Time, Place, and Person)  Thought Content: WDL and Logical   Suicidal Thoughts:  No  Homicidal Thoughts:  No  Memory:  Immediate;   Good Recent;   Good Remote;   Good  Judgement:  Good  Insight:  Good  Psychomotor Activity:  Restlessness  Concentration:  Concentration: Good and Attention Span: Good  Recall:  Good  Fund of Knowledge: Good  Language: Good  Akathisia:  No  Handed:  Right  AIMS (if indicated): Not done  Assets:   Communication Skills Desire for Improvement Financial Resources/Insurance Housing Leisure Time Social Support  ADL's:  Intact  Cognition: WNL  Sleep:  Good   Screenings: AIMS   Flowsheet Row [facility]  AIMS Total Score 0    AUDIT   Flowsheet Row [facility]  Alcohol Use Disorder Identification Test Final Score (AUDIT) 0    GAD-7   Flowsheet Row Clinical Support from 10/08/2020 in [facility] Visit from 08/20/2020 in [facility]  Total GAD-7 Score 17 9    PHQ2-9   Flowsheet Row Clinical Support from 10/08/2020 in [facility] Office Visit from 09/09/2020 in [facility] Office Visit  from 08/20/2020 in [facility] Office Visit from 07/04/2020 in [facility]  PHQ-2 Total Score 6 6 3  0  PHQ-9 Total Score 13 15 9  0    Flowsheet Row Clinical Support from 10/08/2020 in [facility] Hosp-Admission (Discharged) from 08/27/2020 in [facility] Office Visit from 08/20/2020 in [facility]  C-SSRS RISK CATEGORY No Risk No Risk No Risk       Assessment and Plan: Patient endorses symptoms of anxiety and depression.  Provider recommended starting an antidepressant such as Prozac however she notes that at this time she does not want to.  She notes that she is on too many medications and will cope with her anxiety and depression.  No medication changes made today.  She will continue all medications as prescribed.  1. Moderate episode of recurrent major depressive disorder (HCC)  Continue- ARIPiprazole (ABILIFY) 5 MG tablet; TAKE 1 TABLET (5 MG TOTAL) BY MOUTH DAILY.  Dispense: 30 tablet; Refill: 2  2.  Generalized anxiety disorder  Follow-up in 2 months   10/20/2020, NP 10/08/2020, 12:33 PM

## 2020-10-14 ENCOUNTER — Ambulatory Visit: Payer: Medicaid Other | Admitting: Diagnostic Neuroimaging

## 2020-10-14 ENCOUNTER — Other Ambulatory Visit (HOSPITAL_COMMUNITY): Payer: Self-pay

## 2020-10-21 ENCOUNTER — Emergency Department
Admission: EM | Admit: 2020-10-21 | Discharge: 2020-10-21 | Disposition: A | Discharge status: Home / Self Care | Payer: Medicaid HMO | Attending: Emergency Medicine | Admitting: Emergency Medicine

## 2020-10-21 ENCOUNTER — Emergency Department (HOSPITAL_BASED_OUTPATIENT_CLINIC_OR_DEPARTMENT_OTHER): Payer: Self-pay

## 2020-10-21 DIAGNOSIS — T50901A Poisoning by unspecified drugs, medicaments and biological substances, accidental (unintentional), initial encounter: Secondary | ICD-10-CM

## 2020-10-21 DIAGNOSIS — T43591A Poisoning by other antipsychotics and neuroleptics, accidental (unintentional), initial encounter: Secondary | ICD-10-CM | POA: Insufficient documentation

## 2020-10-21 DIAGNOSIS — Z008 Encounter for other general examination: Secondary | ICD-10-CM

## 2020-10-21 DIAGNOSIS — F411 Generalized anxiety disorder: Secondary | ICD-10-CM

## 2020-10-21 DIAGNOSIS — T426X1A Poisoning by other antiepileptic and sedative-hypnotic drugs, accidental (unintentional), initial encounter: Secondary | ICD-10-CM | POA: Insufficient documentation

## 2020-10-21 DIAGNOSIS — F79 Unspecified intellectual disabilities: Secondary | ICD-10-CM

## 2020-10-21 DIAGNOSIS — R000 Tachycardia, unspecified: Secondary | ICD-10-CM | POA: Insufficient documentation

## 2020-10-21 DIAGNOSIS — F32A Depression, unspecified: Secondary | ICD-10-CM | POA: Insufficient documentation

## 2020-10-21 HISTORY — DX: Depression, unspecified: F32.A

## 2020-10-21 HISTORY — DX: Unspecified convulsions: R56.9

## 2020-10-21 LAB — COMPREHENSIVE METABOLIC PANEL
ALT: 12 U/L (ref 0–55)
AST (SGOT): 18 U/L (ref 5–34)
Albumin/Globulin Ratio: 1.4 (ref 0.9–2.2)
Albumin: 4.6 g/dL (ref 3.5–5.0)
Alkaline Phosphatase: 57 U/L (ref 37–117)
Anion Gap: 10 (ref 5.0–15.0)
BUN: 9 mg/dL (ref 7.0–19.0)
Bilirubin, Total: 0.7 mg/dL (ref 0.2–1.2)
CO2: 25 mEq/L (ref 22–29)
Calcium: 9.9 mg/dL (ref 8.5–10.5)
Chloride: 105 mEq/L (ref 100–111)
Creatinine: 0.7 mg/dL (ref 0.6–1.0)
Globulin: 3.4 g/dL (ref 2.0–3.6)
Glucose: 115 mg/dL — ABNORMAL HIGH (ref 70–100)
Potassium: 3.8 mEq/L (ref 3.5–5.1)
Protein, Total: 8 g/dL (ref 6.0–8.3)
Sodium: 140 mEq/L (ref 136–145)

## 2020-10-21 LAB — URINALYSIS REFLEX TO MICROSCOPIC EXAM - REFLEX TO CULTURE
Bilirubin, UA: NEGATIVE
Blood, UA: NEGATIVE
Glucose, UA: NEGATIVE
Ketones UA: NEGATIVE
Leukocyte Esterase, UA: NEGATIVE
Nitrite, UA: NEGATIVE
Protein, UR: NEGATIVE
Specific Gravity UA: 1.004 (ref 1.001–1.035)
Urine pH: 7 (ref 5.0–8.0)
Urobilinogen, UA: NORMAL mg/dL (ref 0.2–2.0)

## 2020-10-21 LAB — CBC AND DIFFERENTIAL
Absolute NRBC: 0 10*3/uL (ref 0.00–0.00)
Basophils Absolute Automated: 0.03 10*3/uL (ref 0.00–0.08)
Basophils Automated: 0.6 %
Eosinophils Absolute Automated: 0.09 10*3/uL (ref 0.00–0.44)
Eosinophils Automated: 1.9 %
Hematocrit: 38.3 % (ref 34.7–43.7)
Hgb: 12.5 g/dL (ref 11.4–14.8)
Immature Granulocytes Absolute: 0.04 10*3/uL (ref 0.00–0.07)
Immature Granulocytes: 0.8 %
Lymphocytes Absolute Automated: 0.97 10*3/uL (ref 0.42–3.22)
Lymphocytes Automated: 20.2 %
MCH: 27.5 pg (ref 25.1–33.5)
MCHC: 32.6 g/dL (ref 31.5–35.8)
MCV: 84.4 fL (ref 78.0–96.0)
MPV: 10.2 fL (ref 8.9–12.5)
Monocytes Absolute Automated: 0.36 10*3/uL (ref 0.21–0.85)
Monocytes: 7.5 %
Neutrophils Absolute: 3.31 10*3/uL (ref 1.10–6.33)
Neutrophils: 69 %
Nucleated RBC: 0 /100 WBC (ref 0.0–0.0)
Platelets: 305 10*3/uL (ref 142–346)
RBC: 4.54 10*6/uL (ref 3.90–5.10)
RDW: 13 % (ref 11–15)
WBC: 4.8 10*3/uL (ref 3.10–9.50)

## 2020-10-21 LAB — GFR: EGFR: >60

## 2020-10-21 LAB — ECG 12-LEAD
Atrial Rate: 105 {beats}/min
P Axis: 62 degrees
P-R Interval: 144 ms
Q-T Interval: 318 ms
QRS Duration: 76 ms
QTC Calculation (Bezet): 420 ms
R Axis: 55 degrees
T Axis: 56 degrees
Ventricular Rate: 105 {beats}/min

## 2020-10-21 LAB — RAPID DRUG SCREEN, URINE
Barbiturate Screen, UR: NEGATIVE
Benzodiazepine Screen, UR: NEGATIVE
Cannabinoid Screen, UR: NEGATIVE
Cocaine, UR: NEGATIVE
Opiate Screen, UR: NEGATIVE
PCP Screen, UR: NEGATIVE
Urine Amphetamine Screen: NEGATIVE

## 2020-10-21 LAB — COVID-19 (SARS-COV-2): SARS CoV-2: NEGATIVE

## 2020-10-21 LAB — ETHANOL: Alcohol: NOT DETECTED mg/dL

## 2020-10-21 LAB — POCT PREGNANCY TEST, URINE HCG: POCT Pregnancy HCG Test, UR: NEGATIVE

## 2020-10-21 LAB — SALICYLATE LEVEL: Salicylate Level: <5 mg/dL — ABNORMAL LOW (ref 15.0–30.0)

## 2020-10-21 LAB — TSH: TSH: 0.84 u[IU]/mL (ref 0.35–4.94)

## 2020-10-21 LAB — ACETAMINOPHEN LEVEL: Acetaminophen Level: <7 ug/mL — ABNORMAL LOW (ref 10–30)

## 2020-10-21 MED ORDER — CHARCOAL ACTIVATED PO LIQD
50.0000 g | Freq: Once | ORAL | Status: AC
Start: 2020-10-21 — End: 2020-10-21
  Administered 2020-10-21: 50 g via ORAL
  Filled 2020-10-21: qty 240

## 2020-10-21 NOTE — ED Student (Signed)
Patient's mother expressed concern over finances, support at home. States her husband is elderly and she cannot leave [place] alone d/t her history of seizures and abnormal behavior. She is concerned because she is having difficulty paying rent and affording clothes for her children. Pt's family was just relocated from [place]. Pt and mom are living in [place], [place] but visiting Pt's sister who was located in [place].     Spoke with social services who informed me there is little resources for out-of-state patients.     I discussed this with pt's mother and provided patient's mother with [facility] Social Services information for in-state resources.

## 2020-10-21 NOTE — ED Notes (Signed)
Spoke with Animal nutritionist from [employer], this RN answered all questions at this time. Pt remains medically cleared per Dr. Nedra Hai, awaiting BH/psych eval. Awake and alert, speaking with family at the bedside.

## 2020-10-21 NOTE — Progress Notes (Signed)
SW assistance requested by medical team regarding patients fiances. SW informed RN that due to patient not being a resident in [place], patient does not qualify for [employer]. RN stated that patient inquired about assistance regarding taking care of her mother in [place]. SW informed RN that patient should contact the Department of Social Services in [place] for assistance.     Jannett Celestine, LCSW   ED Social Worker Care Management   [employer]   [phone]

## 2020-10-21 NOTE — ED Provider Notes (Addendum)
[facility] EMERGENCY DEPARTMENT H&P      Visit date: 10/21/2020      CLINICAL SUMMARY           Diagnosis:    .     Final diagnoses:   Acute depression   Accidental overdose, initial encounter         MDM Notes:      20yo F with overdose of seizure medications. Initially altered, tachycardic, but cleared to baseline with normal vital signs and encouraging labwork within 8 hours observation in ed. Tolerated charcoal, Poison control with no further recommendations. Initially question of suicide attempt, but after evaluation by psychiatry, denied SI, deemed safe to pursue outpatient management.          Disposition:         Discharge         Discharge Prescriptions     None                         CLINICAL INFORMATION        HPI:      Chief Complaint: Drug Overdose (Unintentional OD on seizure and psych medications)  .    Runette Scifres is a 20 y.o. female with pmhx of seizures and depression who presents with reported accidental overdose of medications at 0700 today. Pt's mom and sister state last night pt would not take her usual medications (Lamotrigine, 500 mg and Abilify, 25mg ) as she refused to eat and needs to take the medications with food. Instead of forcing the pt, sister states they decided to wait till morning at which time, pt reportedly got up pn her own, and took 5x her usual dose of each medication. Mom states this is due to pt knowing she missed a dose, and because she does not usually manage her own medications, she accidentally took too much in an attempt to take the amount she missed and was due for. When asked directly, patient does express some vague SI. When EMS arrived, they report pt trying to bite her arm several times. Sister denies pt's depression worsening and mom denies eating this morning or vomiting.        History obtained from: patient, family          ROS:      Positive and negative ROS elements as per HPI.  All other systems reviewed and negative.       Physical Exam:      Pulse (!) 122  BP 121/71  Resp 19  SpO2 96 %  Temp 98.9 F (37.2 C)    Physical Exam   Constitutional: In No acute distress. Vital signs are noted.   HEENT:        Head: Atraumatic.        Nose: No epistaxis.        Mouth/Throat:    OP Clear.    Mucous membranes moist       Eyes: EOM intact. PERRL.  Cardiovascular:         Tachycardic rate       Regular rhythm       No M/G/R.   Pulmonary/Chest:        No accessory muscle use       No rales       No wheezing.  Abdominal:        There are no masses       No tenderness       There is  no rebound.   Musculoskeletal:        Cervical Neck: No TTP. Supple, good ROM.                   Thoracic back: No TTP       Lumbar back: No TTP       Arms: No edema or TTP. Abrasion to LT arm, no repairable lacerations.        Legs: No edema or TTP.   Neurological: Pt is alert, nonverbal, tracking, nodding head yes or no to questions. Fatigable end point horizontal nystagmus.  Pt has normal motor function. No sensory deficits.   Skin: No rash. No pallor.   Psych: Flat affect, does not appear to be responding to internal stimuli.                 PAST HISTORY        Primary Care Provider: No primary care provider on file.        PMH/PSH:    .     Past Medical History:   Diagnosis Date   . Convulsions    . Depression        She has a past surgical history that includes APPENDECTOMY (OPEN).      Social/Family History:      She reports that she has never smoked. She has never used smokeless tobacco. She reports that she does not drink alcohol and does not use drugs.    History reviewed. No pertinent family history.      Listed Medications on Arrival:    .     Home Medications     Med List Status: In Progress Set By: Marvetta Gibbons, RN at 10/21/2020  8:26 AM        No Medications         Allergies: She has No Known Allergies.            VISIT INFORMATION        Clinical Course in the ED:      Update- d/w Poison control. Feel Charcoal would be warranted, but would not  NG/intubate to give. Observe. Supportive care. Recommend 8 hours obs and reassess.    ED Course as of 10/22/20 1215   Mon Oct 21, 2020   1050 Patient now admits to maybe trying to hurt herself this morning.  [AC]   1638 Patient is 8 hours post ingestion, awake, normal vitals. Patient is medically clear for psychiatric evaluation. Sign out to Dr. Verne Grain.  [AC]      ED Course User Index  [AC] Glori Bickers         Medications Given in the ED:    .     ED Medication Orders (From admission, onward)    Start Ordered     Status Ordering Provider    10/21/20 0919 10/21/20 0918  charcoal activated (ACT) liquid 50 g  Once        Route: Oral  Ordered Dose: 50 g     Last MAR action: Given Sotero Brinkmeyer KIM            Procedures:      Procedures      Interpretations:      DDx/Discussion -- uncertain of patient intent, will consult psychiatry.  As far as medical status, suspect will clear in several hours.   Patient is symptomatic at this time, maintain cardiac monitoring, give charcoal, although would not place NG or intubate if pt is  uncooperative as risks would outweigh benefits. Discussed with poison control, they agree with plan. Supportive care. They would recommend at least 8 hours of observation then reassess. Would be reasonable not to admit if pt clears well after observation.     Pulse Oximetry Analysis -  Normal - SpO2 96 % on room air.       Monitor -  Sinus @ 100's    EKG Interpretation  Reviewed and Interpreted by ER physician, France Ravens, MD   Rhythm: Sinus tachycardia  Rate: 105  Intervals/conduction: QTC 420  ST Segments/T wave: No specifically ischemic appearing ST elevations/depressions. Nonspecific ST/T wave changes.        Critical Care Time(not including procedures): 39 minutes.   Due to the high risk of critical illness or multi-organ failure at initial presentation and/or during ED course.    System(s) at risk for compromise:  circulatory and respiratory  Critical Diagnosis:   1. Acute depression     2. Accidental overdose, initial encounter         The patient was Hypotensive:   No     The patient was Hypoxic:   No     This does not including time spent performing other reported procedures or services.   Critical care time involved full attention to the patient's condition and included:   Review of nursing notes and/or old charts - Yes  Documentation time - Yes  Care, transfer of care, and discharge plans - Yes  Obtaining necessary history from family, EMS, nursing home staff and/or treating physicians - Yes  Review of medications, allergies, and vital signs - Yes   Consultant collaboration on findings and treatment options - Yes  Ordering, interpreting, and reviewing diagnostic studies/tab tests - Yes           *This note was generated by the Epic / Dragon speech recognition system and may contain errors or omissions not intended by the user. Grammatical errors, random word insertions, deletions, pronoun errors and incomplete sentences are occasional consequences of this technology due to software limitations. Not all errors are caught or corrected. If there are questions or concerns about the content of this note or information contained within the body of this dictation they should be addressed directly with the author for clarification.*    *This note may contain test results that returned after care was transitioned to another provider or after discharge from the ED. These results are automatically entered in the chart by the electronic medical record, and the expectation is that a subsequent provider will act on any notable results.*                  RESULTS        Lab Results:      Results     Procedure Component Value Units Date/Time    Rapid drug screen, urine [[id number]] Collected: 10/21/20 0959    Specimen: Urine Updated: 10/21/20 1111     Urine Amphetamine Screen Negative     Barbiturate Screen, UR Negative     Benzodiazepine Screen, UR Negative     Cannabinoid Screen, UR Negative      Cocaine, UR Negative     Opiate Screen, UR Negative     PCP Screen, UR Negative    Urinalysis Reflex to Microscopic Exam- Reflex to Culture [[id number]]  (Abnormal) Collected: 10/21/20 0959     Updated: 10/21/20 1036     Urine Type Urine, Clean Ca     Color, UA Yellow  Clarity, UA Cloudy     Specific Gravity UA 1.004     Urine pH 7.0     Leukocyte Esterase, UA Negative     Nitrite, UA Negative     Protein, UR Negative     Glucose, UA Negative     Ketones UA Negative     Urobilinogen, UA Normal mg/dL      Bilirubin, UA Negative     Blood, UA Negative    Urine HCG, POC/ Qualitative [[id number]] Collected: 10/21/20 0936    Specimen: Urine Updated: 10/21/20 1016     POCT QC Pass     POCT Pregnancy HCG Test, UR Negative     Comment: Negative Value is Normal in Healthy Males or Healthy non-pregnant Females    TSH [[id number]] Collected: 10/21/20 0840    Specimen: Blood Updated: 10/21/20 0930     TSH 0.84 uIU/mL     Comprehensive metabolic panel [[id number]]  (Abnormal) Collected: 10/21/20 0840    Specimen: Blood Updated: 10/21/20 0913     Glucose 115 mg/dL      BUN 9.0 mg/dL      Creatinine 0.7 mg/dL      Sodium 295 mEq/L      Potassium 3.8 mEq/L      Chloride 105 mEq/L      CO2 25 mEq/L      Calcium 9.9 mg/dL      Protein, Total 8.0 g/dL      Albumin 4.6 g/dL      AST (SGOT) 18 U/L      ALT 12 U/L      Alkaline Phosphatase 57 U/L      Bilirubin, Total 0.7 mg/dL      Globulin 3.4 g/dL      Albumin/Globulin Ratio 1.4     Anion Gap 10.0    GFR [[id number]] Collected: 10/21/20 0840     Updated: 10/21/20 0913     EGFR >60.0    Ethanol (Alcohol) Level [[id number]] Collected: 10/21/20 0840    Specimen: Blood Updated: 10/21/20 0907     Alcohol NONE DETECTED mg/dL     Acetaminophen Level [[id number]]  (Abnormal) Collected: 10/21/20 0840    Specimen: Blood Updated: 10/21/20 0907     Acetaminophen Level <7 ug/mL     Salicylate Level [[id number]]  (Abnormal) Collected: 10/21/20 0840    Specimen: Blood Updated: 10/21/20 3329      Salicylate Level <5.0 mg/dL     JJOAC-16 (SARS-COV-2) (ID Now)- Behavioral health admission (no isolation) [[id number]] Collected: 10/21/20 0840    Specimen: Nasopharyngeal Swab from Nasopharynx Updated: 10/21/20 0903     Purpose of COVID testing Screening     SARS-CoV-2 Specimen Source Nasopharyngeal     SARS CoV-2 Negative    Narrative:      o Collect and clearly label specimen type:  o Upper respiratory specimen: One Nasopharyngeal Dry Swab NO  Transport Media.  o Hand deliver to laboratory ASAP  Indication for testing->Behavioral health admission  Screening    CBC and differential [[id number]] Collected: 10/21/20 0840    Specimen: Blood Updated: 10/21/20 0855     WBC 4.80 x10 3/uL      Hgb 12.5 g/dL      Hematocrit 22.0 %      Platelets 305 x10 3/uL      RBC 4.54 x10 6/uL      MCV 84.4 fL      MCH 27.5 pg      MCHC 32.6 g/dL  RDW 13 %      MPV 10.2 fL      Neutrophils 69.0 %      Lymphocytes Automated 20.2 %      Monocytes 7.5 %      Eosinophils Automated 1.9 %      Basophils Automated 0.6 %      Immature Granulocytes 0.8 %      Nucleated RBC 0.0 /100 WBC      Neutrophils Absolute 3.31 x10 3/uL      Lymphocytes Absolute Automated 0.97 x10 3/uL      Monocytes Absolute Automated 0.36 x10 3/uL      Eosinophils Absolute Automated 0.09 x10 3/uL      Basophils Absolute Automated 0.03 x10 3/uL      Immature Granulocytes Absolute 0.04 x10 3/uL      Absolute NRBC 0.00 x10 3/uL               Radiology Results:      No orders to display               Scribe Attestation:      I was acting as a Neurosurgeon for Criss Alvine, MD on [id number]  Treatment Team: Scribe: Glori Bickers     I am the first provider for this patient and I personally performed the services documented. Treatment Team: Scribe: Glori Bickers is scribing for me on Fawver,Cathren. This note and the patient instructions accurately reflect work and decisions made by me.  Criss Alvine, MD            Criss Alvine, MD  10/22/20 [phone]

## 2020-10-21 NOTE — ED Student (Addendum)
STUDENT EMERGENCY DEPARTMENT HISTORY AND PHYSICAL EXAM  (This note is for teaching purposes and not part of the official medical record)    Date: 10/21/20  Patient Name: Dominique Parker,Dominique Parker  Student name: Glennie Hawk, BS    History of Presenting Illness:     Chief Complaint: overdose        Dominique Parker is a 20 y.o. F with a PMHx of seizures and depression BIBA after being found to have taken 5x her normal daily dose of Abilify and lamotrigine. HPI is limited as patient was nonverbal during exam.    Mother reports that patient took a total of Abilify 125 mg and lamotrigine 500 mg at 7am this morning. Patient lives with her mother and organizes her medications with a weekly pill box. Per mother, each holder contains 4 lamotrigine 25 mg, 1 Abilify 25 mg, 1 multivitamin, and 1 vitamin B12. This was confirmed twice- mother brought in pill organizer to ED.     Per the patient's sister, patient did not take her medication because she had an empty stomach. This morning, mother believes patient tried to "overcorrect" by taking 5 days worth of her medication. This occurred around 7am this morning. Patient does not have a history of self-harm or suicidal ideation. The sister has noticed some worsening depression over the past couple of days. This has never occurred in the past. Mother states she does not believe patient had SI but instead did not understand and overcorrected her dosage.    Per EMS, patient bit her left arm twice when they arrived.     Patient and family are from [place] and visiting family here in [place] (sister lives in [place]).      History obtained from: patient's mother and sister    Interpreter used: sister acted as Equities trader    Past Medical History:   Depression  Seizures    Past Surgical History:   Appendectomy (open)     Family History:   None    Social History:   Unable to assess as patient was non-verbal during exam. Pt lives with parents  Per mother, no tobacco/EtOH/drug use        Allergies:      No Known Allergies    Medications:     No current facility-administered medications for this encounter.  No current outpatient medications on file.    Review of Systems:   See HPI    Physical Exam:     VS: BP 121/71   Pulse (!) 122   Temp 98.9 F (37.2 C) (Temporal)   Resp 19   Ht 5\' 5"  (1.651 m)   Wt 59.4 kg   SpO2 96%   BMI 21.79 kg/m   Gener: non-toxic, NAD  Head:  Normocephalic, atraumatic  Eyes: No scleral injection/icterus. PERRL, pupils ~73mm.   Neck: Supple  Respiratory/Chest: Breathing comfortably, breath sounds throughout, no w/r/r  Cardiovascular: Regular rhythm, tachycardic, normal S1 and S2, no m/r/g  Abdomen: Non-distended, +BS, no TTP   LowerExtremity: No edema   Skin: non-bleeding erythematous abrasion of left forearm. No rashes/lesions of face, chest, abdomen, BUE, BLE  Psychiatric: Pt nonverbal, staring straight ahead throughout visit, minimal eye contact. Follows directions appropriately.     Assessment/Plan (prior to return of ED testing results):     Dominique Parker is a 20 y.o. F with a PMHx of seizures and depression BIBA for overdose of Abilify (125 mg total) and Lamotrigine (500 mg total) with unclear suicidal ideation. Patient non-verbal on exam  but follows requests. Neurologic and cardiovascular exam reassuring. Pt is currently HD stable with moderate tachycardia.     #Overdose  #Hx of depression  #Hx of seizures  -- Psychiatric evaluation pending  -- Pt started on activated charcoal   -- Cont telemetry monitoring   -- CBC, CMP WNL; toxicology shows no EtOH, acetaminophen, salicylate   -- Urine drug screen and EKG pening  -- Will re-evaluate in 1 hour       Labs:     Results     Procedure Component Value Units Date/Time    Ethanol (Alcohol) Level [[id number]] Collected: 10/21/20 0840    Specimen: Blood Updated: 10/21/20 0907     Alcohol NONE DETECTED mg/dL     Acetaminophen Level [[id number]]  (Abnormal) Collected: 10/21/20 0840    Specimen: Blood Updated: 10/21/20 0907      Acetaminophen Level <7 ug/mL     Salicylate Level [[id number]]  (Abnormal) Collected: 10/21/20 0840    Specimen: Blood Updated: 10/21/20 4010     Salicylate Level <5.0 mg/dL     UVOZD-66 (SARS-COV-2) (ID Now)- Behavioral health admission (no isolation) [[id number]] Collected: 10/21/20 0840    Specimen: Nasopharyngeal Swab from Nasopharynx Updated: 10/21/20 0903     Purpose of COVID testing Screening     SARS-CoV-2 Specimen Source Nasopharyngeal     SARS CoV-2 Negative    Narrative:      o Collect and clearly label specimen type:  o Upper respiratory specimen: One Nasopharyngeal Dry Swab NO  Transport Media.  o Hand deliver to laboratory ASAP  Indication for testing->Behavioral health admission  Screening    CBC and differential [[id number]] Collected: 10/21/20 0840    Specimen: Blood Updated: 10/21/20 0855     WBC 4.80 x10 3/uL      Hgb 12.5 g/dL      Hematocrit 33.2 %      Platelets 305 x10 3/uL      RBC 4.54 x10 6/uL      MCV 84.4 fL      MCH 27.5 pg      MCHC 32.6 g/dL      RDW 13 %      MPV 10.2 fL      Neutrophils 69.0 %      Lymphocytes Automated 20.2 %      Monocytes 7.5 %      Eosinophils Automated 1.9 %      Basophils Automated 0.6 %      Immature Granulocytes 0.8 %      Nucleated RBC 0.0 /100 WBC      Neutrophils Absolute 3.31 x10 3/uL      Lymphocytes Absolute Automated 0.97 x10 3/uL      Monocytes Absolute Automated 0.36 x10 3/uL      Eosinophils Absolute Automated 0.09 x10 3/uL      Basophils Absolute Automated 0.03 x10 3/uL      Immature Granulocytes Absolute 0.04 x10 3/uL      Absolute NRBC 0.00 x10 3/uL     Comprehensive metabolic panel [[id number]] Collected: 10/21/20 0840    Specimen: Blood Updated: 10/21/20 0847    TSH [[id number]] Collected: 10/21/20 0840    Specimen: Blood Updated: 10/21/20 0847          Rads:     Radiology Results (24 Hour)     ** No results found for the last 24 hours. **            Clinical Impression:     Final Diagnosis: Overdose (lamotrigine,  Abilify)

## 2020-10-21 NOTE — ED Notes (Addendum)
8:27 PM  Pt s/o to me by Dr Nedra Hai  Seen by pych now  Cleared for d/c  poison control recommended 8 hrs obs. Pt vss here 12 hrs       Elesa Hacker, MD  10/21/20 2029

## 2020-10-21 NOTE — Discharge Instructions (Addendum)
[facility] [phone]  Provided at no cost to individuals and families*  *Individuals and families with commercial insurance may be required, by their insurer, to provide a copay for psychiatric assessment and medication services.  Rapid mobile response   Prevents unnecessary hospitalizations and placements outside the home  24-hour intervention  Screening and triage  Clinical assessments, including lethality  Psychiatric assessment and services  Bilingual counselors  Case management  Post discharge follow-up  Care coordination with community resources and professionals  Safety planning    Eligibility  Children, youth, and adults who are experiencing a psychiatric crisis due to mental health issues that place them at risk of psychiatric hospitalization.    Service Areas  [facility] [place] [facility] [place] [facility] [place] of [place] [facility] [place]     Collaboration  [facility] collaborates with CSBs, CSA agencies, and other professionals so that every individual, child, and family served may benefit from coordinated care and a team approach. The collaboration process is further enhanced through [employer], awareness campaigns, and training so that every locality may improve its ability to prevent crises and provide a successful response.    Costs  [facility] is provided at no cost to individuals and families. It is funded through both [employer] and [employer]. Individuals and families with commercial insurance may be required, by their insurer, to provide a copay for psychiatric assessment and medication services.    Benefit of [facility]'s Public/Private Partnership  All eligible clients served regardless of insurance or legal status  Phone support  Licensed and master's-level crisis counselors  48-hour psychiatric assessment  Bilingual counselors  Family support  Care coordination and support  services  Service and support after discharge  Crisis intervention training  Collaboration with agencies  _____________________________________    How to apply for [employer] accepts applications for health coverage year-round. You can choose from three different ways to apply:  Apply online CongressQuestions.[email or web address]  Call the Cover [facility] at?1-833-5CALLVA?(TDD: [phone]) to apply on the phone Mon - Fri: 8:00 am to 7:00 pm and Sat: 9:00 am to 12:00 pm  Mail or drop off a paper application (available in [place] and [place])? to your local Department of Social Services (mailing may take longer than other methods of applying).?Find your nearest local Department of Social Services.  Medicaid for Children  What services does Medicaid for Children (FAMIS Plus) cover?  Medicaid for Children covers these services:  Dental care  Doctor visits  Early and Periodic Screening, Diagnosis and Treatment (EPSDT) program for children (comprehensive preventive and treatment services)  Emergency care  Hospital visits  Mental health care  Prescription medicine  Tests and X-rays  Vaccinations  Vision care  Well-baby check-ups  Well-child check-ups  Adults Aged 19-64  Overview  Since June 15, 2017, more adults living in [place] have access to quality, low-cost, health insurance through [place]. Covered adults include individuals ages 52-64 with income at or below 138% of the federal poverty limit. Individuals in the adult group have comprehensive health care coverage provided through the [facility] programs. Most eligible individuals are enrolled in managed care, in either the Med[employer] 4[employer]0 or the [facility]) program. Individuals may be in [facility] fee-for-service briefly before enrolling in managed  care.  _____________________________________________  _________________________________              ________________________________________________________________    Refugee Services  The [facility] Program ([place]) is a short-term transitional program that helps refugees and other eligible recipients become economically self-sufficient. Funding for this program comes from the Office of Refugee Resettlement within the [place] Department of Health and [employer]. [place] RAP consists of two service areas: Engineer, mining and [employer]    Refugee Architect involves two programs:    Refugee Cash Assistance (RCA) is short-term financial support provided to eligible individuals who participate in employment services in accordance with an Employability Plan.  Refugee Medical Assistance (RMA) is a short-term medical assistance program available to eligible individuals in order to stabilize their health shortly after arrival in the [place].  Refugee Support Services  Refugee Support Services (RSS) provide transitional assistance to help refugees become economically self-sufficient and to assist them in becoming integrated members of their communities. Services include:    Employment services  Case Emergency planning/management officer  Citizenship and immigration services  Translation and interpretation services  Social adjustment services  Private-non-profit service provider agencies, under contract with the state of [place] , also provide specialized services to eligible refugees such as interpretation and or transportation services if available. There are no income eligibility criteria for RSS, but services must be provided in accordance with a written service plan. Refugees are not eligible for RSS once they have lived in the [place] for five years or become  [place] citizens.    Individuals eligible for [place] Refuge[place] Assistance Program include:    Refugees, individuals fleeing from persecution in their homelands  Certain Cuban/Haitian entrants and parolees  Certain Amerasians (from [place])  Victims of human trafficking  Asylees  How to Apply  Apply for refugee services at your local Department of Social Services.  More information can be found in the DSS Refugee Assistance Manual.    Contact  Phone: [phone]  Fax: [phone]

## 2020-10-21 NOTE — ED Notes (Signed)
Spoke with Diane from [employer], updated on pt labs, VS, and pt status. No questions at this time.

## 2020-10-21 NOTE — ED Triage Notes (Signed)
Pt BIBA after taking OD of seizure and psych medications; was not an intentional OD.  Missed medication last night, so pt took more to "catch up".  Pt does not live independently--lives with parents in [place], but is here visiting.  Pt bit herself on left forearm PTA w/ EMS; did draw blood with bites.  Pt not responding to questions, staring at ceiling.  Hx: depression & seizures.  Mom at bedside

## 2020-10-21 NOTE — Progress Notes (Signed)
Psychiatric Evaluation Part I    Dominique Parker is a 20 y.o. female admitted to the [facility] Emergency Department who was seen on 10/21/2020 by Lerry Liner, LMSW.    Call Details  Patient Location: [facility] ED  Patient Room Number: S22  Time contacted by ED Physician: 1101  Time consult began: 1750  Time (in minutes) from Call to Consult: 409  Time consult concluded: 1705  Referring ED Department  Emergency Department: [facility]          --  [place] Suicide Severity Rating Scale (Short Version)  1. In the past month - Have you wished you were dead or wished you could go to sleep and not wake up?: Yes  2. In the past month - Have you actually had any thoughts of killing yourself?: Yes  3. In the past month - Have you been thinking about how you might kill yourself?: No  4. In the past month - Have you had these thoughts and had some intention of acting on them?: No  5. In the past month - Have you started to work out or worked out the details of how to kill yourself? Do you intend to carry out this plan?: No  6. Have you ever done anything, started to do anything, or prepared to do anything to end your life: No  CSSRS Risk Level : Low    Specific Questioning About Thoughts, Plans, and Suicidal Intent  How many times have you had these thoughts? (Past Month): Less than once a week  When you have the thoughts how long do they last? (Past Month): Fleeting - few seconds or minutes  Could/can you stop thinking about killing yourself or wanting to die if you want to? (Past Month): Easily able to control thoughts  Are there things - anyone or anything (e.g. family, religion, pain of death) - that stopped you from wanting to die or acting on thoughts of suicide? (Past Month): Deterrents definitely stopped you from attempting suicide  What sort of reasons did you have for thinking about wanting to die or killing yourself?  Was it to end the pain or stop the way you were feeling (in other words you couldn't go on living with  this pain or how you were feeling) or was it to get attention: Mostly to get attention, revenge or a reaction from others  Total Score of Intensity of Ideation: 6    Step 2: Identify Risk Factors  Clinical Status (Current/Recent): Highly impulsive behavior, Aggressive behavior towards others  Clinical Status (Lifetime): Patient denies  Precipitants/Stressors: Chronic physical pain or acute medical problem (CNS disorder, COPD, cancer, etc), Inadequate social supports, Current or pending isolation or feeling alone  Treatment History / Dx: Non-adherent to treatment or not receiving treatment  Access to lethal methods: No, does not have access to lethal methods    Step 3: Identify Protective Factors  Internal: Religious beliefs, Fear of death or the actual act of killing self, Identifies reasons for living  External: Cultural, spiritual and/or moral attitudes against suicide, Supportive social networks or family  Other protective factors: N/A    Step 4: Guidelines to Determine Level of Risk  Suicide Risk Level: Low    Step 5: Possible Interventions to LOWER Risk Level  Behavioral Health Ambulatory, or Emergency Department: Give emergency/crisis phone numbers  Behavioral Health Inpatient Unit:  (N/A)    Step 6: Documentation  Summary of Evaluation: Pt presents with SIB concerns and unintentional OD.  Pt denies SI,HI,and AVH. Pt endorses some paranoia - feeling talked about.  --  Presenting Problem: Pt was BIB[facility] to [facility] ED. Pt has PMH of depression and anxiety. Pt has no IP tx hx. Pt reports she does not know why she is at the hospital at this time. Pt appeared confused and easily distracted. Pt was present with mom at bedside. Mom elaborated largely during assessment. Per mom pt unintentionally OD on seizure and psychotropic medication. Mom states pt missed taking it on her scheduled days and thought it was a good idea to take all of it at once to make up for the days she missed taking it on time. Pt told TW it was not a  SA. There were also concerns for SIB from pt. PTA pt bite herself on her left forearm and bled a little. Pt told TW she doesn't not remember why she did it. Per mom "she forgets everything, doesn't remember anything.She has some type of intellectual disability. She is 20 y/o but she acts 40." Mom also added that in [place] pt did not complete high school and was intellectually a grade or two behind her peers. Moreover, pt reports feeling well yesterday. Pt also reports difficulty falling asleep, decreased appetite, and low energy levels in the past month.     Current Major Stressors: Pt denies.    Psychiatric History:      Inpatient treatment history: Pt denies.      Outpatient treatment history (current & past): Pt reports having a psychiatrist in [place]. Next visit will be November 19, 2020. Denies therapist or other outpatient providers.     Diagnoses (past): Depression and anxiety.     Prior Medication trials: Pt denies.     Suicide Attempts/self- Injurious behaviors: Pt denies SA and 1 SIB incident (occurred today).     Hx of violence/aggression:   Within the Last 6 Months:: history of violence toward self  Greater than 6 Months Ago:: no history of violence toward self  Violence Toward Self: episode(s) of violence towards self   Violence Toward Others  Within the Last 6 Months:: history of violence toward others  Describe History of Violence Toward Others: other (comment) (Pt has been aggressive with mom and scratches her when she gets upset.)  History of Aggression or Assaults: N/A  Greater than 6 Months Ago:: no history of violence toward others     Trauma History: Pt denies.    Medical History (Include active  and chronic medical conditions): Pt has seizures, unknown medical condition. Pt's mom could not elaborate.        When did violence toward self occur?: yesterday  Description of Violence Toward Self: Biting self     Substance Abuse History  Previous Substance Abuse Treatment?: No  Recovery and  Support Involvement  Current Involvement: None  Past Involvement: None  Sponsor (Yes/No): N/A  Have you been prescribed medications to support recovery?: None  Recovery Resources/Support: N/A  Legal Guardian/Parent: N/A  Support Systems: None  Substance Recovery Support  Have you been prescribed medications to support recovery?: None  Recovery Resources/Support: N/A  Transportation Available: N/A  Past Withdrawal Symptoms  Past Withdrawal Symptoms: None  History of Blackouts?: No  History of Withdrawal Seizures?: No      Preliminary Diagnosis #2: F79 Unspecified Intellectual disabilities  Preliminary Diagnosis #3: N/A  Preliminary Diagnosis #4: N/A    Preliminary Diagnosis #5: N/A      Home Medications (names, doses, frequency, psychiatric, non psychiatric, compliant/non  compliant): Per mom pt is taking Lamotrigine 25mg  and Aripiprazole 5mg . Compliant.    Social History:        Living Arrangements: Parent               Type of Residence: Private residence                                                          Support Systems: None         Employment status/occupation: Unemployed.      Legal history: Pt denies.    Family History (psych dx, substance use, suicide attempts): Pt denies.    Presenting Mental Status  Orientation Level: Oriented to place, Disoriented to person, Disoriented to time, Disoriented to situation  Memory: Recent memory impaired  Thought Content: normal  Thought Process: blocking  Behavior: normal  Consciousness: Confused  Impulse Control: normal  Perception: normal  Eye Contact: normal  Attitude: guarded, cooperative  Mood: depressed, unstable  Hopelessness Affects Goals: No  Hopelessness About Future: No  Affect: normal  Speech: normal  Concentration: normal  Insight: fair  Judgment: fair  Appearance: normal  Appetite: decreased  Weight change?: normal  Energy: decreased  Sleep: difficulty falling asleep (and difficulty staying asleep)  Reliability of Reporter/Patient: fair    Summary: Pt is a  20 y/o female who present to the hospital after OD on her medication for seizures and a psychotropic medication. Pt also attempted to hurt herself by biting self while inside the ambulance and bled a little. Please note TW had a video interpreter to help translate for pt (Interpreter ID [phone]). Pt primarily speaks Farsi, minimal [place]. Pt was A&Ox2. Pt was oriented to person and place. Disoriented to time and situation. Mom reports that they are visiting from [place] and a psych eval was requested for a non-intentional OD attempt. Per pt it was not a SA. Pt is on a regular schedule daily to take her medication and she missed a few days and thought it was okay to take all of it at once this morning. Per mom pt took 25 pills of lamotrigine (25mg ) and 7 pills of aripiprazole (5mg ). Pt also does not recall biting herself and could not confirm why she did it. Per mom pt has never self-harmed before, today was the first time. Mom adds that pt has depression and paranoia. States pt is always upset and or "jealous" when I talk with her siblings. Mom reports that pt has also recently been aggressive with her only, via scratching her this week when she gets upset. Mom states she doesn't have any other concerns for pt's safety. Pt denies SI, HI, and AVH. Pt had her first SIB today by bitting herself on her left forearm and reports some paranoia - feeling like people are talking about her. TW recommends d/c with resources. Pt and pt's mom agreed with recommendation and were welcoming of resources. TW added resources into pt's AVS and provided physical copies of others.        @18 :73 TW collaborated with ED Physician and treatment team about disposition to d/c with resources.    Diagnosis: Preliminary Diagnosis #1: F41.1 GAD (generalized anxiety disorder)    Preliminary Diagnosis (DSM IV)  Axis I: F41.1 GAD (generalized anxiety disorder)  F79 Unspecified intellectual disabilities  Axis  II: deferred  Axis III: see  medical chart  Axis IV: Primary support group, Access to health care services, Housing  Axis V on Admission: 61-70  Axis V - Highest in Past Year: UTA    Patient expects to be discharged to:: mom       Justification for disposition: Pt denies SI, HI, and AVH. Pt had one SIB incident today not prior hx. Pt also endorses some paranoia, per mom. TW recommends d/c with resources for pt. Pt does not meet IP criteria.    If patient is voluntarily admitted to an inpatient psychiatric unit and decides to leave AMA within the first 8 hours on the unit, is there an identified petitioner?N/A    Name of Petitioner: N/A  Contact Information: N/A  If no petitioner is identified, please explain why not: D/C    Insurance Pre-authorization information: N/A    Was consent for voluntary admission obtain and scanned into EPIC? N/A       By whom? N/A        Patient's COVID Status/Vaccine Info:  1.) Is the patient currently COVID+ or COVID-? COVID Negative  2.) If COVID+, is the patient symptomatic or asymptomatic? Asymptomatic  3.) Is the patient fully vaccinated (both doses + booster)? UNKNOWN  4.) If no, how many doses have they had? N/A        Lerry Liner, LMSW    [facility]  [address]  [place], [place] [address]  [phone]

## 2020-10-21 NOTE — ED Notes (Signed)
Emley, BHSW at the bedside with pt and pt mother using Farsi interpreter for assessment.

## 2020-10-21 NOTE — ED Notes (Signed)
Bed: S 22  Expected date:   Expected time:   Means of arrival: [id number] [place]  Comments:

## 2020-10-22 ENCOUNTER — Telehealth: Payer: Self-pay | Admitting: Pediatric Intensive Care

## 2020-10-22 ENCOUNTER — Encounter (HOSPITAL_COMMUNITY): Payer: Self-pay

## 2020-10-22 NOTE — Telephone Encounter (Signed)
Called [place] and advised her Dr Marjory Lies advises to restart medications same as her last dosing. Left # for questions.

## 2020-10-22 NOTE — Telephone Encounter (Signed)
Received text message from client sponsor [name] to call ASAP. CN returned call. Client is visiting family in [place]. Client ingested an unknown amount of abilify, lamictal and keppra. Client was evaluated at a hospital in [place] and released. According to client's mother, client was told to resume Abilify. Client was also instructed to call [facility] to get instructions for resuming seizure medication. CN advised that she will call [facility] to update them. CN also advised that she will call client's mother [name] tomorrow around 0900. Shann Medal RN BSN CNP [phone]

## 2020-10-23 ENCOUNTER — Telehealth: Payer: Self-pay | Admitting: Pediatric Intensive Care

## 2020-10-23 NOTE — Telephone Encounter (Signed)
Call to client's mother with [facility] # [phone] Farsi interpreter. CN advised [name] that Dr [facility] office said that it was safe to resume Lamictal 100mg  in morning and evening. CN also advised that client need to resume Abilify as ordered. CN advised that client has appointment with [facility] on 5/16. Client's mother is aware. She needs 6/16 transportation to appointment. CN advised going to Dr Benedetto Goad walk in hours on Tuesday 5/17 at 0800. CN will arrange 12-07-2003 transportation for that as well. Client's  mother does not have a working cell phone number. CN will contact case manager Vung Ksor regarding phones. Client's mother is unclear as to whether client intentionally took the medication to harm herself.  Benedetto Goad RN BSN CNP [phone]

## 2020-10-23 NOTE — Telephone Encounter (Signed)
Call to [facility] to discuss resuming lamictal and keppra. Spoke with Julienne Kass RN. Client can resume lamictal 100mg  bid. She no longer takes keppra. Client has follow up appointment with Dr on Monday 5/16. Please arrive by 1330. 11-12-1975 RN BSN CNP [phone]

## 2020-10-23 NOTE — Telephone Encounter (Signed)
[place] called, had not listened to message I left her yesterday. I advised her per MD to have patient restart medication as scheduled. We confirmed she is to be done with levetiracetam now, taking lamictal 100 mg twice daily. Confirmed her FU on 10/28/20, arrive 1:30 for 2 pm FU. [place] will call mom today and give her information, verbalized understanding, appreciation.

## 2020-10-28 ENCOUNTER — Telehealth: Payer: Self-pay | Admitting: Pediatric Intensive Care

## 2020-10-28 ENCOUNTER — Encounter: Payer: Self-pay | Admitting: Diagnostic Neuroimaging

## 2020-10-28 ENCOUNTER — Other Ambulatory Visit (HOSPITAL_COMMUNITY): Payer: Self-pay | Admitting: Psychiatry

## 2020-10-28 ENCOUNTER — Ambulatory Visit: Payer: Medicaid Other | Admitting: Diagnostic Neuroimaging

## 2020-10-28 ENCOUNTER — Other Ambulatory Visit (HOSPITAL_COMMUNITY): Payer: Self-pay

## 2020-10-28 DIAGNOSIS — G40909 Epilepsy, unspecified, not intractable, without status epilepticus: Secondary | ICD-10-CM | POA: Diagnosis not present

## 2020-10-28 DIAGNOSIS — F330 Major depressive disorder, recurrent, mild: Secondary | ICD-10-CM

## 2020-10-28 DIAGNOSIS — F29 Unspecified psychosis not due to a substance or known physiological condition: Secondary | ICD-10-CM

## 2020-10-28 MED ORDER — ARIPIPRAZOLE 5 MG PO TABS
ORAL_TABLET | Freq: Every day | ORAL | 2 refills | Status: DC
  Filled 2020-10-28: qty 30, 30d supply, fill #0

## 2020-10-28 MED ORDER — LAMOTRIGINE 100 MG PO TABS: 100.0000 mg | ORAL_TABLET | Freq: Two times a day (BID) | ORAL | 12 refills | Status: DC

## 2020-10-28 NOTE — Telephone Encounter (Signed)
Call to client's mother- ID x 2. CN received text message from [place] that Naba has an eye appointment this morning and needs transportation. CN also will set up ride to [facility] (1400 appointment today) and to [facility] for walk in follow up with Dr Doyne Keel. [name] states that [facility] is out of lamotrigine. CN will check with Outpatient Pharmacy to see if she can refill. Shann Medal RN BSN CNP [phone]

## 2020-10-28 NOTE — Progress Notes (Signed)
[facility]  PATIENT: Sara Moon DOB: 11/08/2000  REFERRING CLINICIAN: Quincy Simmonds, MD HISTORY FROM: patient  REASON FOR VISIT: follow up   HISTORICAL  CHIEF COMPLAINT:  Chief Complaint  Patient presents with  . Seizures    Rm 7, FU mom- [id number], interpreter- [place]  "patient needs Lamotrigine to take tonight, mom understands she must pick up Rx"     HISTORY OF PRESENT ILLNESS:   UPDATE (10/28/20, VRP): Since last visit, had overdose episode in [place] a few weeks ago.  Now back to baseline. Tolerating lamotrigine. No more events of seizure.   UPDATE (09/30/20, VRP): Since last visit, had complicated course with psychosis, mood swings, weakness, then stopped depakote in 08/20/20 by psychiatry for weakness. Then had breakthrough seizures. Had VEEG in March 2022, with [facility]nl discharges, now on LTG + LEV titration and doing better. Rare episodes of flushing. No alleviating or aggravating factors. Tolerating meds. Now on abilify for psychiatry and doing well. Last major seizures on 08/27/20.  PRIOR HPI: 20 year old female here for evaluation of abnormal spells.  Patient accompanied by mother, Nurse, learning disability, and sponsor.  Patient was born in [place] in 2002, with normal uncomplicated birth.  However she had developmental delay and did not start talking or walking until age 21 years old.  Even at that time her language abilities were limited.  Eventually she was able to attend school and complete through 11th grade.  However she only attained seventh grade ability.    At age 14 years old patient was having some abnormal spells of eye blinking, freezing, falling down.  Her family took her to [place] for evaluation and patient had MRI, EEG and neurology evaluation.  No specific diagnosis was made but patient was started on a medication for about 6 months which seems to help.  Mother thinks this might have been an antiseizure medication but is not sure.  Patient returned to  [place], completed medication for 6 months and then stopped it.  At age 24 years old patient had another evaluation in [place] at a different hospital, with MRI and EEG.  At that time she was diagnosed with a small arachnoid cyst, which is thought to be an incidental finding.  She was again started on some type of medication for about 6 months, possibly antiseizure medication.  She was recommended to follow-up, but could not because of Covid pandemic.  Patient came to the [place] as a refugee, initially to  and then to [place].  She saw internal medicine clinic in [place], was started on levetiracetam but this caused some side effect and abnormal movements.  Medication was stopped and patient referred here for further evaluation.  Patient having at least two episodes per day of memory lapse, abnormal involuntary movements, almost falling down, falling down, confusion.  Patient had several of these episodes during her evaluation today, with abnormal flinging movements of her arm, almost falling down but then catching herself, without confusion or memory lapse.  These did not seem like seizure events but more like behavioral spells.   REVIEW OF SYSTEMS: Full 14 system review of systems performed and negative with exception of: As per HPI.  ALLERGIES: No Known Allergies  HOME MEDICATIONS: Outpatient Medications Prior to Visit  Medication Sig Dispense Refill  . ARIPiprazole (ABILIFY) 5 MG tablet TAKE 1 TABLET (5 MG TOTAL) BY MOUTH DAILY. 30 tablet 2  . lamoTRIgine (LAMICTAL) 25 MG tablet Take as directed, gradually increase to 100mg  twice a day by Oct 14, 2020 100 tablet 1  . Multiple Vitamin (MULTIVITAMIN WITH MINERALS) TABS tablet Take 1 tablet by mouth daily. 30 tablet 0  . Multiple Vitamin (MULTIVITAMIN) capsule Take 1 capsule by mouth daily.    . vitamin B-12 (CYANOCOBALAMIN) 1000 MCG tablet TAKE 1 TABLET (1,000 MCG TOTAL) BY MOUTH DAILY. 30 tablet 0  . ARIPiprazole  (ABILIFY) 5 MG tablet TAKE 1 TABLET (5 MG TOTAL) BY MOUTH DAILY. (Patient not taking: Reported on 10/28/2020) 30 tablet 2  . levETIRAcetam (KEPPRA) 250 MG tablet Take 1 tablet (250 mg total) by mouth 2 (two) times daily. (Patient not taking: Reported on 10/28/2020) 30 tablet 1   No facility-administered medications prior to visit.    PAST MEDICAL HISTORY: Past Medical History:  Diagnosis Date  . Concern for seizure disorder (HCC)   . Depression     PAST SURGICAL HISTORY: Past Surgical History:  Procedure Laterality Date  . APPENDECTOMY  2008    FAMILY HISTORY: Family History  Problem Relation Age of Onset  . Hypertension Other     SOCIAL HISTORY: Social History   Socioeconomic History  . Marital status: Single    Spouse name: Not on file  . Number of children: 0  . Years of education: Not on file  . Highest education level: Not on file  Occupational History  . Not on file  Tobacco Use  . Smoking status: Never Smoker  . Smokeless tobacco: Never Used  Vaping Use  . Vaping Use: Never used  Substance and Sexual Activity  . Alcohol use: Never  . Drug use: Never  . Sexual activity: Never    Birth control/protection: None  Other Topics Concern  . Not on file  Social History Narrative   Lives with mother and father and older brother in [place]. Sixth of seven children. Other siblings in refugee camps across [place] and [place] anticipate will be joining family soon. Currently taking English classes    Social Determinants of Health   Financial Resource Strain: Not on file  Food Insecurity: Not on file  Transportation Needs: Not on file  Physical Activity: Not on file  Stress: Not on file  Social Connections: Not on file  Intimate Partner Violence: Not on file     PHYSICAL EXAM  GENERAL EXAM/CONSTITUTIONAL: Vitals:  Vitals:   10/28/20 1343  BP: (!) 93/59  Pulse: 87  Weight: 126 lb 9.6 oz (57.4 kg)  Height: 5\' 5"  (1.651 m)   Body mass index is 21.07 kg/m. Wt  Readings from Last 3 Encounters:  10/28/20 126 lb 9.6 oz (57.4 kg)  09/30/20 123 lb (55.8 kg)  09/09/20 119 lb 11.2 oz (54.3 kg)    Patient is in no distress; well developed, nourished and groomed; neck is supple  CARDIOVASCULAR:  Examination of carotid arteries is normal; no carotid bruits  Regular rate and rhythm, no murmurs  Examination of peripheral vascular system by observation and palpation is normal  EYES:  Ophthalmoscopic exam of optic discs and posterior segments is normal; no papilledema or hemorrhages No exam data present  MUSCULOSKELETAL:  Gait, strength, tone, movements noted in Neurologic exam below  NEUROLOGIC: MENTAL STATUS:  No flowsheet data found.  awake, alert, oriented to person, place and time  recent and remote memory intact  normal attention and concentration  language fluent, comprehension intact, naming intact  fund of knowledge appropriate  CRANIAL NERVE:   2nd - no papilledema on fundoscopic exam  2nd, 3rd, 4th, 6th - pupils equal and reactive to light,  visual fields full to confrontation, extraocular muscles intact, no nystagmus  5th - facial sensation symmetric  7th - facial strength symmetric  8th - hearing intact  9th - palate elevates symmetrically, uvula midline  11th - shoulder shrug symmetric  12th - tongue protrusion midline  MOTOR:   normal bulk and tone, full strength in the BUE, BLE  SENSORY:   normal and symmetric to light touch, temperature, vibration  COORDINATION:   finger-nose-finger, fine finger movements normal  REFLEXES:   deep tendon reflexes present and symmetric  GAIT/STATION:   narrow based gait     DIAGNOSTIC DATA (LABS, IMAGING, TESTING) - I reviewed patient records, labs, notes, testing and imaging myself where available.  Lab Results  Component Value Date   WBC 6.9 09/09/2020   HGB 12.0 09/09/2020   HCT 37.4 09/09/2020   MCV 87 09/09/2020   PLT 350 09/09/2020       Component Value Date/Time   NA 140 09/09/2020 1702   K 4.4 09/09/2020 1702   CL 102 09/09/2020 1702   CO2 21 09/09/2020 1702   GLUCOSE 102 (H) 09/09/2020 1702   GLUCOSE 89 08/29/2020 0045   BUN 14 09/09/2020 1702   CREATININE 0.56 (L) 09/09/2020 1702   CALCIUM 9.6 09/09/2020 1702   PROT 7.7 08/27/2020 1958   PROT 7.4 08/20/2020 1053   ALBUMIN 4.6 08/27/2020 1958   ALBUMIN 5.0 08/20/2020 1053   AST 18 08/27/2020 1958   ALT 17 08/27/2020 1958   ALKPHOS 39 08/27/2020 1958   BILITOT 0.7 08/27/2020 1958   BILITOT 0.4 08/20/2020 1053   GFRNONAA >60 08/29/2020 0045   GFRAA 151 07/25/2020 1020   Lab Results  Component Value Date   CHOL 152 08/02/2020   HDL 54 08/02/2020   LDLCALC 85 08/02/2020   TRIG 64 08/02/2020   CHOLHDL 2.8 08/02/2020   Lab Results  Component Value Date   HGBA1C 5.3 08/02/2020   Lab Results  Component Value Date   VITAMINB12 627 08/28/2020   Lab Results  Component Value Date   TSH 1.050 09/09/2020    07/25/20  MRI brain  1. No acute brain finding. 2. Few small foci of T2 and FLAIR signal with the hemispheric white matter, most noticeable adjacent to the posterior body of the left lateral ventricle. These are nonspecific and could be chronic insults, even related to prenatal or perinatal insult. There is some potential that this could represent the earliest manifestation of demyelinating disease/multiple sclerosis. 3. 12 x 24 mm arachnoid cyst at the right posterior frontal convexity, not likely of any clinical significance.  08/05/20 EEG - This study showed evidence ofindependentepileptogenicity arising from left and right frontotemporal region as well asmoderate diffuse encephalopathy, nonspecific etiology.No seizures were seen throughout the recording.    ASSESSMENT AND PLAN  20 y.o. year old female here with developmental delay, possible seizure disorder, abnormal behavior spells, depression and PTSD.  Dx:  1. Seizure disorder  (HCC)     PLAN:  developmental delay + seizure disorder + behavior spells - continue lamotrigine 100mg  twice a day  depression / PTSD - follow up with psychiatry (continue abilify)  Meds ordered this encounter  Medications  . lamoTRIgine (LAMICTAL) 100 MG tablet    Sig: Take 1 tablet (100 mg total) by mouth 2 (two) times daily.    Dispense:  60 tablet    Refill:  12   Return in about 4 months (around 02/28/2021).    03/02/2021, MD 10/28/2020, 2:23 PM  Certified in Neurology, Neurophysiology and [facility] [address] [place], [place] [address] [phone]

## 2020-10-30 ENCOUNTER — Other Ambulatory Visit (HOSPITAL_COMMUNITY): Payer: Self-pay

## 2020-11-19 ENCOUNTER — Other Ambulatory Visit: Payer: Self-pay

## 2020-11-19 ENCOUNTER — Ambulatory Visit (INDEPENDENT_AMBULATORY_CARE_PROVIDER_SITE_OTHER): Payer: Medicaid Other | Admitting: Psychiatry

## 2020-11-19 ENCOUNTER — Telehealth: Payer: Self-pay | Admitting: Pediatric Intensive Care

## 2020-11-19 ENCOUNTER — Encounter (HOSPITAL_COMMUNITY): Payer: Self-pay | Admitting: Psychiatry

## 2020-11-19 VITALS — BP 100/58 | HR 62 | Ht 65.0 in | Wt 128.0 lb

## 2020-11-19 DIAGNOSIS — F330 Major depressive disorder, recurrent, mild: Secondary | ICD-10-CM

## 2020-11-19 DIAGNOSIS — M25571 Pain in right ankle and joints of right foot: Secondary | ICD-10-CM | POA: Diagnosis not present

## 2020-11-19 MED ORDER — ARIPIPRAZOLE 5 MG PO TABS
ORAL_TABLET | Freq: Every day | ORAL | 2 refills | Status: DC
Start: 2020-11-19 — End: 2021-01-27

## 2020-11-19 NOTE — Telephone Encounter (Signed)
Call from [facility] CM Vung Ksor and connected call to [facility] rep regarding client eligibility for day care services. After call CN discussed upcoming appointments for [place] as well as possibility of IDD evaluation via [facility]. CN will follow up on this for client and family. Shann Medal RN BSN CNP [phone]

## 2020-11-19 NOTE — Progress Notes (Signed)
BH MD/PA/NP OP Progress Note  11/19/2020 5:01 PM Sara Moon  MRN:  [id number]  Chief Complaint: "I feel good" Chief Complaint    Medication Management     HPI: 20 year old female seen today for follow up psychiatric evaluation. She has a psychiatric history of psychotic disorder, psycho organic syndrome, and depression.  She is currently managed on Abilify 5 mg and Lamictal 100 mg twice daily (prescribed by her neurologist).  She notes her medications are effective in managing her psychiatric conditions.  Today provider utilized an interpreter as patient speaks [place].  Today she is pleasant, calm, cooperative, restless (rocking which her mother notes she has done since childhood) and engaged in conversation.  She informed provider that overall she is doing well.  She notes that she feels good.  She informed provider that she wants to be more independent.  She notes that she is attempting to learn English to accomplish this task.  Patient informed writer that her anxiety and depression has improved since her last visit.  Provider conducted a GAD-7 and patient scored a 9, at her last visit she scored a 17.  Provider also conducted a PHQ-9 and patient scored a 6, at her last visit she scored a 13.  Patient endorses adequate appetite and sleep. Provider informed patient that she was notified that she had ingested an unknown amount of her medication while in [place].  Patient notes that this was not a suicide attempt however reports that she had forgotten how much of her pills she had taken and took more.  She notes that this incident only occurred once.  She was seen with her sister-in-law who notes that this behavior has not occurred since and reports that her family is in her presents when she takes medications. Today she denies SI/HI/VH, mania, or paranoia.   Patient was seen with her sister-in-law and her interpreter.  Patient's sister-in-law notes that she has been doing well.  She informed Clinical research associate  that at times patient becomes obsessed with bathing.  She notes that she becomes easily bothered when this topic is brought up and reports that at times she can be overly sensitive.  She however notes that patient does maintain her hygiene and showers every other day.  Patient interpreter notes that she has seen an improvement in patient's behavior since she began providing her services.  She notes that she is calmer and less anxious.  She also informed Clinical research associate that she is caring and thoughtful.  Patient's sister-in-law agrees with this statement.   Patient's sister-in-law informed Clinical research associate that family is trying to have her attend a day program at [facility].  Per sister-in-law psychological testing is required.  [facility].    Patient also informed provider that she continues to have right foot pain.  Provider put a referral in for patient to have this assessed at patients request.  No medication changes made today.  Patient agreeable to continue medication as prescribed.  Patient's sister-in-law and interpreter notes that the neurologist believes therapy would be beneficial.  Patient notes that she is agreeable to therapy and was referred to outpatient counseling today.  No other concerns noted at this time.   Visit Diagnosis:    ICD-10-CM   1. Pain in joint involving right ankle and foot  M25.571 Ambulatory referral to Internal Medicine  2. Mild episode of recurrent major depressive disorder (HCC)  F33.0 ARIPiprazole (ABILIFY) 5 MG tablet    Past Psychiatric History: psychotic  disorder, psycho organic syndrome, and depression.  Past Medical History:  Past Medical History:  Diagnosis Date  . Concern for seizure disorder (HCC)   . Depression     Past Surgical History:  Procedure Laterality Date  . APPENDECTOMY  2008    Family Psychiatric History: Denies  Family History:  Family History  Problem Relation Age of Onset  .  Hypertension Other     Social History:  Social History   Socioeconomic History  . Marital status: Single    Spouse name: Not on file  . Number of children: 0  . Years of education: Not on file  . Highest education level: Not on file  Occupational History  . Not on file  Tobacco Use  . Smoking status: Never Smoker  . Smokeless tobacco: Never Used  Vaping Use  . Vaping Use: Never used  Substance and Sexual Activity  . Alcohol use: Never  . Drug use: Never  . Sexual activity: Never    Birth control/protection: None  Other Topics Concern  . Not on file  Social History Narrative   Lives with mother and father and older brother in [place]. Sixth of seven children. Other siblings in refugee camps across [place] and [place] anticipate will be joining family soon. Currently taking English classes    Social Determinants of Health   Financial Resource Strain: Not on file  Food Insecurity: Not on file  Transportation Needs: Not on file  Physical Activity: Not on file  Stress: Not on file  Social Connections: Not on file    Allergies: No Known Allergies  Metabolic Disorder Labs: Lab Results  Component Value Date   HGBA1C 5.3 08/02/2020   MPG 105.41 08/02/2020   No results found for: PROLACTIN Lab Results  Component Value Date   CHOL 152 08/02/2020   TRIG 64 08/02/2020   HDL 54 08/02/2020   CHOLHDL 2.8 08/02/2020   VLDL 13 08/02/2020   LDLCALC 85 08/02/2020   Lab Results  Component Value Date   TSH 1.050 09/09/2020   TSH 0.406 08/02/2020    Therapeutic Level Labs: No results found for: LITHIUM Lab Results  Component Value Date   VALPROATE 37 (L) 08/28/2020   VALPROATE 66 08/20/2020   No components found for:  CBMZ  Current Medications: Current Outpatient Medications  Medication Sig Dispense Refill  . lamoTRIgine (LAMICTAL) 100 MG tablet Take 1 tablet (100 mg total) by mouth 2 (two) times daily. 60 tablet 12  . Multiple Vitamin (MULTIVITAMIN WITH MINERALS) TABS  tablet Take 1 tablet by mouth daily. 30 tablet 0  . Multiple Vitamin (MULTIVITAMIN) capsule Take 1 capsule by mouth daily.    . vitamin B-12 (CYANOCOBALAMIN) 1000 MCG tablet TAKE 1 TABLET (1,000 MCG TOTAL) BY MOUTH DAILY. 30 tablet 0  . ARIPiprazole (ABILIFY) 5 MG tablet TAKE 1 TABLET (5 MG TOTAL) BY MOUTH DAILY. 30 tablet 2   No current facility-administered medications for this visit.     Musculoskeletal: Strength & Muscle Tone: within normal limits Gait & Station: normal Patient leans: N/A  Psychiatric Specialty Exam: Review of Systems  Blood pressure (!) 100/58, pulse 62, height 5\' 5"  (1.651 m), weight 128 lb (58.1 kg), unknown if currently breastfeeding.Body mass index is 21.3 kg/m.  General Appearance: Well Groomed  Eye Contact:  Good  Speech:  Clear and Coherent and Normal Rate  Volume:  Normal  Mood:  Euthymic  Affect:  Appropriate and Congruent  Thought Process:  Coherent, Goal Directed and Linear  Orientation:  Full (Time, Place, and Person)  Thought Content: WDL and Logical   Suicidal Thoughts:  No  Homicidal Thoughts:  No  Memory:  Immediate;   Good Recent;   Good Remote;   Good  Judgement:  Good  Insight:  Good  Psychomotor Activity:  Normal and Rocking  Concentration:  Concentration: Good and Attention Span: Good  Recall:  Good  Fund of Knowledge: Good  Language: Good  Akathisia:  No  Handed:  Right  AIMS (if indicated): Not done  Assets:  Communication Skills Desire for Improvement Financial Resources/Insurance Housing Leisure Time Social Support  ADL's:  Intact  Cognition: WNL  Sleep:  Good   Screenings: AIMS   Flowsheet Row [facility]  AIMS Total Score 0    AUDIT   Flowsheet Row [facility]  Alcohol Use Disorder Identification Test Final Score (AUDIT) 0    GAD-7   Flowsheet Row Clinical  Support from 11/19/2020 in [facility] Clinical Support from 10/08/2020 in [facility] Office Visit from 08/20/2020 in [facility]  Total GAD-7 Score 9 17 9     PHQ2-9   Flowsheet Row Clinical Support from 11/19/2020 in [facility] Clinical Support from 10/08/2020 in [facility] Office Visit from 09/09/2020 in [facility] Office Visit from 08/20/2020 in [facility] Office Visit from 07/04/2020 in [facility]  PHQ-2 Total Score 1 6 6 3  0  PHQ-9 Total Score 6 13 15 9  0    Flowsheet Row Clinical Support from 11/19/2020 in [facility] Clinical Support from 10/08/2020 in [facility] ED to Hosp-Admission (Discharged) from 08/27/2020 in [facility]  C-SSRS RISK CATEGORY No Risk No Risk No Risk       Assessment and Plan: Patient notes that her anxiety and depression has improved since her last visit.  She informed 08/29/2020 that she continues to have right foot pain.  Provider put in a referral to have this evaluated.  Patient's family member would also like her tested for intellectual disabilities she is requirement of the day program at [facility].  Provider placed referral to [facility].  No medication changes made today.  Patient agreeable to continue medications prescribed.    1. Mild episode of recurrent major depressive disorder (HCC)   Continue- ARIPiprazole (ABILIFY) 5 MG tablet; TAKE 1 TABLET (5 MG TOTAL) BY MOUTH DAILY.  Dispense: 30 tablet; Refill: 2 Referral sent to [facility]  2. Pain in joint involving right ankle and foot  - Ambulatory referral to Internal Medicine   Follow-up in 2 months  Follow-up with therapy   [place], NP 11/19/2020, 5:01 PM

## 2020-11-19 NOTE — Telephone Encounter (Signed)
Call to transportation services to set up ride to [facility] appointment for this afternoon. Shann Medal RN BSN CNP [phone]

## 2020-11-20 NOTE — Telephone Encounter (Signed)
Generally Clinical research associate sends IDD referrals to [facility].  Provider has written this referral and will have administrative staff fax it over.  Provider spoke to patient and her family today who informed writer that the referral is needed for a day service program at [facility].

## 2020-11-28 ENCOUNTER — Other Ambulatory Visit (HOSPITAL_COMMUNITY): Payer: Self-pay | Admitting: Psychiatry

## 2020-11-28 DIAGNOSIS — M25571 Pain in right ankle and joints of right foot: Secondary | ICD-10-CM

## 2020-12-05 ENCOUNTER — Telehealth: Payer: Self-pay | Admitting: Pediatric Intensive Care

## 2020-12-05 NOTE — Telephone Encounter (Signed)
Spoke with Lyla Son at [facility]. They cannot disclose status of referral without an ROI. Shann Medal RN BSN CNP [phone]

## 2020-12-17 ENCOUNTER — Encounter: Payer: Self-pay | Admitting: *Deleted

## 2020-12-31 ENCOUNTER — Ambulatory Visit (HOSPITAL_COMMUNITY): Payer: Medicaid Other | Admitting: Licensed Clinical Social Worker

## 2021-01-16 ENCOUNTER — Telehealth: Payer: Self-pay | Admitting: Pediatric Intensive Care

## 2021-01-16 NOTE — Telephone Encounter (Signed)
Call to [employer] to schedule rides for appointments on 8/11 and 8/15. Shann Medal RN BSN CNP [phone]

## 2021-01-16 NOTE — Telephone Encounter (Signed)
Message to client's mother regarding any need for transportation for appointments on 8/11 and 8/15. Client needs transportation to both appointments. Shann Medal RN BSN CNP [phone]

## 2021-01-23 ENCOUNTER — Other Ambulatory Visit: Payer: Self-pay

## 2021-01-23 ENCOUNTER — Ambulatory Visit (INDEPENDENT_AMBULATORY_CARE_PROVIDER_SITE_OTHER): Payer: Medicaid Other | Admitting: Licensed Clinical Social Worker

## 2021-01-23 DIAGNOSIS — F331 Major depressive disorder, recurrent, moderate: Secondary | ICD-10-CM | POA: Diagnosis not present

## 2021-01-27 ENCOUNTER — Other Ambulatory Visit: Payer: Self-pay

## 2021-01-27 ENCOUNTER — Encounter (HOSPITAL_COMMUNITY): Payer: Self-pay | Admitting: Psychiatry

## 2021-01-27 ENCOUNTER — Ambulatory Visit (INDEPENDENT_AMBULATORY_CARE_PROVIDER_SITE_OTHER): Payer: Medicaid Other | Admitting: Psychiatry

## 2021-01-27 DIAGNOSIS — F330 Major depressive disorder, recurrent, mild: Secondary | ICD-10-CM

## 2021-01-27 MED ORDER — ARIPIPRAZOLE 5 MG PO TABS
ORAL_TABLET | Freq: Every day | ORAL | 2 refills | Status: DC
Start: 2021-01-27 — End: 2021-05-12

## 2021-01-27 NOTE — Progress Notes (Signed)
BH MD/PA/NP OP Progress Note  01/27/2021 2:15 PM Sara Moon  MRN:  [id number]  Chief Complaint: "My Left eye becomes very painful"   HPI: 20 year old female seen today for follow up psychiatric evaluation. She has a psychiatric history of psychotic disorder, psycho organic syndrome, and depression.  She is currently managed on Abilify 5 mg and Lamictal 100 mg twice daily (prescribed by her neurologist).  She notes her medications are effective in managing her psychiatric conditions.  Today provider utilized an interpreter as patient speaks [place].   Today she is pleasant, calm, cooperative, restless (rocking which her mother notes she has done since childhood) and engaged in conversation.  She informed provider that mentally she feels stable.  She however notes that the last 2 weeks she has been having intense left eye pain.  She informed Clinical research associate that when this pain occurs she becomes forgetful and notes that she sees bright flashing lights.  She denies one-sided weakness, ocular issues (noting that she was seen by her ophthalmologist), migraines, seizure-like activities, hypertension, or complaints with cognition.    Patient informed writer that her anxiety and depression continues to be minimal.  On 01/23/2021 a PHQ-9 was done and patient scored an 8, at her last visit she scored a 9.  GAD-7 not conducted today.  Provider endorses adequate sleep and appetite.  Today she denies SI/HI/VAH, mania, or paranoia.    Patient informed Clinical research associate that she becomes bored at home.  She notes that her parents and her siblings and activities in their live.  She notes that she would like to go back to school and learn something.  Patient referred to [facility] to help with resources.  Patient was seen with her mother who notes that overall she is doing well.  She notes that she is concerned about her increased eye pain.  Provider informed patient and her mother that they should follow-up with  neurology.  They endorsed understanding and agreed.  No medication changes made today.  Patient agreeable to continue medications as prescribed.  She will follow-up with outpatient counseling for therapy.  No other concerns noted at this time.   Visit Diagnosis:    ICD-10-CM   1. Mild episode of recurrent major depressive disorder (HCC)  F33.0 ARIPiprazole (ABILIFY) 5 MG tablet      Past Psychiatric History: psychotic disorder, psycho organic syndrome, and depression.  Past Medical History:  Past Medical History:  Diagnosis Date   Concern for seizure disorder [facility])    Depression     Past Surgical History:  Procedure Laterality Date   APPENDECTOMY  2008    Family Psychiatric History: Denies  Family History:  Family History  Problem Relation Age of Onset   Hypertension Other     Social History:  Social History   Socioeconomic History   Marital status: Single    Spouse name: Not on file   Number of children: 0   Years of education: Not on file   Highest education level: Not on file  Occupational History   Not on file  Tobacco Use   Smoking status: Never   Smokeless tobacco: Never  Vaping Use   Vaping Use: Never used  Substance and Sexual Activity   Alcohol use: Never   Drug use: Never   Sexual activity: Never    Birth control/protection: None  Other Topics Concern   Not on file  Social History Narrative   Lives with mother and father and older brother in [place]. Sixth  of seven children. Other siblings in refugee camps across [place] and [place] anticipate will be joining family soon. Currently taking English classes    Social Determinants of Health   Financial Resource Strain: Not on file  Food Insecurity: Not on file  Transportation Needs: Not on file  Physical Activity: Not on file  Stress: Not on file  Social Connections: Not on file    Allergies: No Known Allergies  Metabolic Disorder Labs: Lab Results  Component Value Date   HGBA1C 5.3 08/02/2020    MPG 105.41 08/02/2020   No results found for: PROLACTIN Lab Results  Component Value Date   CHOL 152 08/02/2020   TRIG 64 08/02/2020   HDL 54 08/02/2020   CHOLHDL 2.8 08/02/2020   VLDL 13 08/02/2020   LDLCALC 85 08/02/2020   Lab Results  Component Value Date   TSH 1.050 09/09/2020   TSH 0.406 08/02/2020    Therapeutic Level Labs: No results found for: LITHIUM Lab Results  Component Value Date   VALPROATE 37 (L) 08/28/2020   VALPROATE 66 08/20/2020   No components found for:  CBMZ  Current Medications: Current Outpatient Medications  Medication Sig Dispense Refill   ARIPiprazole (ABILIFY) 5 MG tablet TAKE 1 TABLET (5 MG TOTAL) BY MOUTH DAILY. 30 tablet 2   lamoTRIgine (LAMICTAL) 100 MG tablet Take 1 tablet (100 mg total) by mouth 2 (two) times daily. 60 tablet 12   Multiple Vitamin (MULTIVITAMIN WITH MINERALS) TABS tablet Take 1 tablet by mouth daily. 30 tablet 0   Multiple Vitamin (MULTIVITAMIN) capsule Take 1 capsule by mouth daily.     vitamin B-12 (CYANOCOBALAMIN) 1000 MCG tablet TAKE 1 TABLET (1,000 MCG TOTAL) BY MOUTH DAILY. 30 tablet 0   No current facility-administered medications for this visit.     Musculoskeletal: Strength & Muscle Tone: within normal limits Gait & Station: normal Patient leans: N/A  Psychiatric Specialty Exam: Review of Systems  unknown if currently breastfeeding.There is no height or weight on file to calculate BMI.  General Appearance: Well Groomed  Eye Contact:  Good  Speech:  Clear and Coherent and Normal Rate  Volume:  Normal  Mood:  Euthymic  Affect:  Appropriate and Congruent  Thought Process:  Coherent, Goal Directed and Linear  Orientation:  Full (Time, Place, and Person)  Thought Content: WDL and Logical   Suicidal Thoughts:  No  Homicidal Thoughts:  No  Memory:  Immediate;   Good Recent;   Good Remote;   Good  Judgement:  Good  Insight:  Good  Psychomotor Activity:  Normal and Rocking  Concentration:   Concentration: Good and Attention Span: Good  Recall:  Good  Fund of Knowledge: Good  Language: Good  Akathisia:  No  Handed:  Right  AIMS (if indicated): Not done  Assets:  Communication Skills Desire for Improvement Financial Resources/Insurance Housing Leisure Time Social Support  ADL's:  Intact  Cognition: WNL  Sleep:  Good   Screenings: AIMS    Flowsheet Row [facility]  AIMS Total Score 0      AUDIT    Flowsheet Row [facility]  Alcohol Use Disorder Identification Test Final Score (AUDIT) 0      GAD-7    Flowsheet Row Clinical Support from 11/19/2020 in [facility] Clinical Support from 10/08/2020 in [facility] Office Visit from 08/20/2020 in [facility]  Total GAD-7 Score 9 17 9       PHQ2-9    Flowsheet Row Counselor from 01/23/2021 in [facility] Clinical Support from 11/19/2020 in [facility] Clinical Support from 10/08/2020 in [facility] Office Visit from 09/09/2020 in [facility] Office Visit from 08/20/2020 in [facility]  PHQ-2 Total Score 3 1 6 6 3   PHQ-9 Total Score 8 6 13 15 9       Flowsheet Row Counselor from 01/23/2021 in [facility] Clinical Support from 11/19/2020 in [facility] Clinical Support from 10/08/2020 in [facility]  C-SSRS RISK CATEGORY No Risk No Risk No Risk        Assessment and Plan: Patient notes that she feels mentally stable.  She informed [facility] however that she has been having severe left eye pain which follows forgetfulness and flashes of bright light.  Provider recommended patient  going to neurology for further evaluation.  No medication changes made today.  Patient agreeable to continue medications as prescribed.  1. Mild episode of recurrent major depressive disorder (HCC)   Continue- ARIPiprazole (ABILIFY) 5 MG tablet; TAKE 1 TABLET (5 MG TOTAL) BY MOUTH DAILY.  Dispense: 30 tablet; Refill: 2 Referral sent to [facility]     Follow-up in 2 months  Follow-up with therapy   10/10/2020, NP 01/27/2021, 2:15 PM

## 2021-01-28 NOTE — Progress Notes (Signed)
Comprehensive Clinical Assessment (CCA) Note  01/28/2021 Sara Moon [id number]  Chief Complaint:  Chief Complaint  Patient presents with   Depression   Visit Diagnosis: MDD, recurrent, moderate   CCA Biopsychosocial Intake/Chief Complaint:  Depression  Current Symptoms/Problems: Per mother she states pt is "overly sensitive" and has anger with physical attacks, spitting, throwing things, hitting. Last incident ~ one mon ago. Has always had a rocking motion while sitting. Mother states pt would never talk if not spoken to. When asked pt's thoughts on mother's comments pt states she agrees with all mother said. She futher states she does not intend to act this way and does not like she behaves this way. States she has felt depressed "always" and "Wish I wasn't born".  Patient Reported Schizophrenia/Schizoaffective Diagnosis in Past: No  Strengths: Open to help  Preferences: In person sessions, call her [place] pronounced "Deeba".  Abilities: No data recorded  Type of Services Patient Feels are Needed: counseling and med management  Initial Clinical Notes/Concerns: Pt comes in with interpreter via [facility], [facility], and bio mother. LCSW reviewed informed consent for counseling with pt's full acknowledgement, no questions. Pt advises she has not participated in counseling before. She states she is here because she wants to be here not d/t others desire. Much of assessment not able to be completed due to length of time the interpreting took. Mother in session for first 20 min stating her input. Pt reportedly taking meds as prescribed. Pt reports a desire to "feel useful", wishes to work and go to school. Denies SI yet states "wish I wasn't born". Pt noted to be distressed over repeatedly being asked about SI post session as asked with med management as well.   Mental Health Symptoms Depression:   Irritability; Change in energy/activity   Duration of Depressive symptoms:  Greater than two  weeks   Mania:   None   Anxiety:    Tension; Restlessness   Psychosis:   -- (Needs investigation)   Duration of Psychotic symptoms:  -- (patient expressed psychotic break)   Trauma:   -- (Needs investigation)   Obsessions:   -- (Needs investigation)   Compulsions:   -- (Needs investigation)   Inattention:   None   Hyperactivity/Impulsivity:   Feeling of restlessness   Oppositional/Defiant Behaviors:   Angry; Aggression towards people/animals   Emotional Irregularity:   Mood lability; Intense/inappropriate anger; Potentially harmful impulsivity   Other Mood/Personality Symptoms:  No data recorded   Mental Status Exam Appearance and self-care  Stature:   Small   Weight:   Thin   Clothing:   Casual   Grooming:   Well-groomed   Cosmetic use:   None   Posture/gait:   Tense; Other (Comment) (continuous rocking motion most of session.)   Motor activity:   Repetitive; Restless   Sensorium  Attention:   -- (Needs investigation)   Concentration:   Normal (Pt denies concentration issues)   Orientation:   -- (Needs investigation)   Recall/memory:   Defective in Recent (Pt states she did not remember when she came to [place] or why. Per interpreter pt came Oct 2021 d/t [place].)   Affect and Mood  Affect:   Flat; Restricted; Depressed   Mood:   Depressed   Relating  Eye contact:   Avoided   Facial expression:   Tense; Depressed   Attitude toward examiner:   Guarded   Thought and Language  Speech flow:  Soft   Thought content:   Appropriate to Mood  and Circumstances   Preoccupation:   -- (Needs investigation)   Hallucinations:   -- (Needs investigation)   Organization:  No data recorded  [employer] of Knowledge:   -- (Needs investigation)   Intelligence:   -- Industrial/product designer)   Abstraction:   -- (Needs investigation)   Judgement:   -- (Needs investigation)   Reality Testing:   -- (Needs investigation)   Insight:    Present   Decision Making:   Impulsive   Social Functioning  Social Maturity:   Isolates; Impulsive   Social Judgement:   -- (Needs investigation)   Stress  Stressors:   Transitions; Work; School (Pt reports she wants to work and go to school)   Coping Ability:   Human resources officer Deficits:   Self-control; Interpersonal; Communication   Supports:   Family     Religion:  UTA  Leisure/Recreation: Leisure / Recreation Do You Have Hobbies?: Yes Leisure and Hobbies: "TV"  Exercise/Diet: Exercise/Diet Do You Exercise?: No Do You Have Any Trouble Sleeping?:  (Needs investigation)  CCA Employment/Education Employment/Work Situation: Employment / Work Situation Employment Situation: Unemployed (Pt reports goal of working) Has Patient ever Been in Equities trader?: No  Education: Education Is Patient Currently Attending School?: No Last Grade Completed:  (UTA) Did You Have Any Difficulty At [employer]?: Yes (Mother reports pt got into fights at school)   CCA Family/Childhood History Family and Relationship History: Family history Marital status: Single Does patient have children?: No  Childhood History:  Childhood History By whom was/is the patient raised?: Both parents Description of patient's relationship with caregiver when they were a child: "good" Patient's description of current relationship with people who raised him/her: "good", lives with both parents and one brother Does patient have siblings?: Yes Number of Siblings: 6 (Reports 4 sisters and 2 brothers) Description of patient's current relationship with siblings: "good" with brother in the home. Did patient suffer any verbal/emotional/physical/sexual abuse as a child?:  (UTA) Has patient ever been sexually abused/assaulted/raped as an adolescent or adult?:  (UTA) Was the patient ever a victim of a crime or a disaster?:  (UTA) Witnessed domestic violence?:  (UTA) Has patient been affected by  domestic violence as an adult?:  (UTA)  CCA Substance Use Alcohol/Drug Use: Alcohol / Drug Use History of alcohol / drug use?: No history of alcohol / drug abuse   DSM5 Diagnoses: Patient Active Problem List   Diagnosis Date Noted   Pain in joint involving right ankle and foot 11/19/2020   Seizure (HCC) 08/28/2020   Mild episode of recurrent major depressive disorder (HCC) 08/20/2020   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

## 2021-01-29 ENCOUNTER — Encounter: Payer: Self-pay | Admitting: Diagnostic Neuroimaging

## 2021-01-29 ENCOUNTER — Telehealth: Payer: Self-pay | Admitting: Pediatric Intensive Care

## 2021-01-29 ENCOUNTER — Ambulatory Visit: Payer: Medicaid Other | Admitting: Diagnostic Neuroimaging

## 2021-01-29 ENCOUNTER — Other Ambulatory Visit: Payer: Self-pay

## 2021-01-29 VITALS — BP 99/61 | HR 81 | Ht 65.0 in | Wt 134.0 lb

## 2021-01-29 DIAGNOSIS — R6250 Unspecified lack of expected normal physiological development in childhood: Secondary | ICD-10-CM | POA: Diagnosis not present

## 2021-01-29 DIAGNOSIS — G40909 Epilepsy, unspecified, not intractable, without status epilepticus: Secondary | ICD-10-CM | POA: Diagnosis not present

## 2021-01-29 NOTE — Progress Notes (Signed)
[facility]  PATIENT: Sara Moon DOB: 12/30/2000  REFERRING CLINICIAN: Quincy Simmonds, MD HISTORY FROM: patient  REASON FOR VISIT: follow up   HISTORICAL  CHIEF COMPLAINT:  Chief Complaint  Patient presents with   Follow-up    Rm 6 with mom and interpreter here for 4 month f/u Pt reports she has been doing ok some left eye pain noted.     HISTORY OF PRESENT ILLNESS:   UPDATE (01/29/21, VRP): Since last visit, doing about the same. No major seizures. Some issues with left eye redness and pain.   UPDATE (10/28/20, VRP): Since last visit, had overdose episode in [place] a few weeks ago.  Now back to baseline. Tolerating lamotrigine. No more events of seizure.   UPDATE (09/30/20, VRP): Since last visit, had complicated course with psychosis, mood swings, weakness, then stopped depakote in 08/20/20 by psychiatry for weakness. Then had breakthrough seizures. Had VEEG in March 2022, with abnl discharges, now on LTG + LEV titration and doing better. Rare episodes of flushing. No alleviating or aggravating factors. Tolerating meds. Now on abilify for psychiatry and doing well. Last major seizures on 08/27/20.  PRIOR HPI: 20 year old female here for evaluation of abnormal spells.  Patient accompanied by mother, Nurse, learning disability, and sponsor.  Patient was born in [place] in 2002, with normal uncomplicated birth.  However she had developmental delay and did not start talking or walking until age 72 years old.  Even at that time her language abilities were limited.  Eventually she was able to attend school and complete through 11th grade.  However she only attained seventh grade ability.    At age 75 years old patient was having some abnormal spells of eye blinking, freezing, falling down.  Her family took her to [place] for evaluation and patient had MRI, EEG and neurology evaluation.  No specific diagnosis was made but patient was started on a medication for about 6 months which  seems to help.  Mother thinks this might have been an antiseizure medication but is not sure.  Patient returned to [place], completed medication for 6 months and then stopped it.  At age 28 years old patient had another evaluation in [place] at a different hospital, with MRI and EEG.  At that time she was diagnosed with a small arachnoid cyst, which is thought to be an incidental finding.  She was again started on some type of medication for about 6 months, possibly antiseizure medication.  She was recommended to follow-up, but could not because of Covid pandemic.  Patient came to the [place] as a refugee, initially to [place] and then to [place].  She saw internal medicine clinic in [place], was started on levetiracetam but this caused some side effect and abnormal movements.  Medication was stopped and patient referred here for further evaluation.  Patient having at least two episodes per day of memory lapse, abnormal involuntary movements, almost falling down, falling down, confusion.  Patient had several of these episodes during her evaluation today, with abnormal flinging movements of her arm, almost falling down but then catching herself, without confusion or memory lapse.  These did not seem like seizure events but more like behavioral spells.   REVIEW OF SYSTEMS: Full 14 system review of systems performed and negative with exception of: As per HPI.  ALLERGIES: No Known Allergies  HOME MEDICATIONS: Outpatient Medications Prior to Visit  Medication Sig Dispense Refill   ARIPiprazole (ABILIFY) 5 MG tablet TAKE 1 TABLET (5 MG TOTAL) BY  MOUTH DAILY. 30 tablet 2   Multiple Vitamin (MULTIVITAMIN WITH MINERALS) TABS tablet Take 1 tablet by mouth daily. 30 tablet 0   Multiple Vitamin (MULTIVITAMIN) capsule Take 1 capsule by mouth daily.     vitamin B-12 (CYANOCOBALAMIN) 1000 MCG tablet TAKE 1 TABLET (1,000 MCG TOTAL) BY MOUTH DAILY. 30 tablet 0   lamoTRIgine (LAMICTAL) 100 MG  tablet Take 1 tablet (100 mg total) by mouth 2 (two) times daily. (Patient not taking: Reported on 01/29/2021) 60 tablet 12   No facility-administered medications prior to visit.    PAST MEDICAL HISTORY: Past Medical History:  Diagnosis Date   Concern for seizure disorder (HCC)    Depression     PAST SURGICAL HISTORY: Past Surgical History:  Procedure Laterality Date   APPENDECTOMY  2008    FAMILY HISTORY: Family History  Problem Relation Age of Onset   Hypertension Other     SOCIAL HISTORY: Social History   Socioeconomic History   Marital status: Single    Spouse name: Not on file   Number of children: 0   Years of education: Not on file   Highest education level: Not on file  Occupational History   Not on file  Tobacco Use   Smoking status: Never   Smokeless tobacco: Never  Vaping Use   Vaping Use: Never used  Substance and Sexual Activity   Alcohol use: Never   Drug use: Never   Sexual activity: Never    Birth control/protection: None  Other Topics Concern   Not on file  Social History Narrative   Lives with mother and father and older brother in [place]. Sixth of seven children. Other siblings in refugee camps across [place] and [place] anticipate will be joining family soon. Currently taking English classes    Social Determinants of Health   Financial Resource Strain: Not on file  Food Insecurity: Not on file  Transportation Needs: Not on file  Physical Activity: Not on file  Stress: Not on file  Social Connections: Not on file  Intimate Partner Violence: Not on file     PHYSICAL EXAM  GENERAL EXAM/CONSTITUTIONAL: Vitals:  Vitals:   01/29/21 1325  BP: 99/61  Pulse: 81  Weight: 134 lb (60.8 kg)  Height: 5\' 5"  (1.651 m)   Body mass index is 22.3 kg/m. Wt Readings from Last 3 Encounters:  01/29/21 134 lb (60.8 kg)  11/19/20 128 lb (58.1 kg)  10/28/20 126 lb 9.6 oz (57.4 kg)   Patient is in no distress; well developed, nourished and groomed;  neck is supple  CARDIOVASCULAR: Examination of carotid arteries is normal; no carotid bruits Regular rate and rhythm, no murmurs Examination of peripheral vascular system by observation and palpation is normal  EYES: Ophthalmoscopic exam of optic discs and posterior segments is normal; no papilledema or hemorrhages; CONJUNCTIVAL INJECTION IN LEFT EYE No results found.  MUSCULOSKELETAL: Gait, strength, tone, movements noted in Neurologic exam below  NEUROLOGIC: MENTAL STATUS:  No flowsheet data found. awake, alert, oriented to person, place and time recent and remote memory intact normal attention and concentration language fluent, comprehension intact, naming intact fund of knowledge appropriate  CRANIAL NERVE:  2nd - no papilledema on fundoscopic exam 2nd, 3rd, 4th, 6th - pupils equal and reactive to light, visual fields full to confrontation, extraocular muscles intact, no nystagmus 5th - facial sensation symmetric 7th - facial strength symmetric 8th - hearing intact 9th - palate elevates symmetrically, uvula midline 11th - shoulder shrug symmetric 12th - tongue protrusion  midline  MOTOR:  normal bulk and tone, full strength in the BUE, BLE  SENSORY:  normal and symmetric to light touch, temperature, vibration  COORDINATION:  finger-nose-finger, fine finger movements normal  REFLEXES:  deep tendon reflexes present and symmetric  GAIT/STATION:  narrow based gait     DIAGNOSTIC DATA (LABS, IMAGING, TESTING) - I reviewed patient records, labs, notes, testing and imaging myself where available.  Lab Results  Component Value Date   WBC 6.9 09/09/2020   HGB 12.0 09/09/2020   HCT 37.4 09/09/2020   MCV 87 09/09/2020   PLT 350 09/09/2020      Component Value Date/Time   NA 140 09/09/2020 1702   K 4.4 09/09/2020 1702   CL 102 09/09/2020 1702   CO2 21 09/09/2020 1702   GLUCOSE 102 (H) 09/09/2020 1702   GLUCOSE 89 08/29/2020 0045   BUN 14 09/09/2020 1702    CREATININE 0.56 (L) 09/09/2020 1702   CALCIUM 9.6 09/09/2020 1702   PROT 7.7 08/27/2020 1958   PROT 7.4 08/20/2020 1053   ALBUMIN 4.6 08/27/2020 1958   ALBUMIN 5.0 08/20/2020 1053   AST 18 08/27/2020 1958   ALT 17 08/27/2020 1958   ALKPHOS 39 08/27/2020 1958   BILITOT 0.7 08/27/2020 1958   BILITOT 0.4 08/20/2020 1053   GFRNONAA >60 08/29/2020 0045   GFRAA 151 07/25/2020 1020   Lab Results  Component Value Date   CHOL 152 08/02/2020   HDL 54 08/02/2020   LDLCALC 85 08/02/2020   TRIG 64 08/02/2020   CHOLHDL 2.8 08/02/2020   Lab Results  Component Value Date   HGBA1C 5.3 08/02/2020   Lab Results  Component Value Date   VITAMINB12 627 08/28/2020   Lab Results  Component Value Date   TSH 1.050 09/09/2020    07/25/20  MRI brain  1. No acute brain finding. 2. Few small foci of T2 and FLAIR signal with the hemispheric white matter, most noticeable adjacent to the posterior body of the left lateral ventricle. These are nonspecific and could be chronic insults, even related to prenatal or perinatal insult. There is some potential that this could represent the earliest manifestation of demyelinating disease/multiple sclerosis. 3. 12 x 24 mm arachnoid cyst at the right posterior frontal convexity, not likely of any clinical significance.  08/05/20 EEG - This study showed evidence of independent epileptogenicity arising  from left and right frontotemporal region as well as moderate diffuse encephalopathy, nonspecific etiology. No seizures were seen throughout the recording.    ASSESSMENT AND PLAN  20 y.o. year old female here with developmental delay, possible seizure disorder, abnormal behavior spells, depression and PTSD.  Dx:  1. Seizure disorder (HCC)   2. Developmental delay      PLAN:  developmental delay + seizure disorder + behavior spells - continue lamotrigine 100mg  twice a day  Left eye pain / redness - follow up with eye clinic; suspect conjunctivitis    depression / PTSD - follow up with psychiatry (continue abilify)  Return in about 6 months (around 08/01/2021).    08/03/2021, MD 01/29/2021, 2:11 PM Certified in Neurology, Neurophysiology and Neuroimaging  [facility] [address] [place], [place] [phone]

## 2021-01-29 NOTE — Telephone Encounter (Signed)
Received call from client's mother- client has new appointment date 8/17 at 1330. CN will schedule ride for client. Shann Medal RN BSN CNP [phone]

## 2021-01-29 NOTE — Telephone Encounter (Signed)
Call to transportation services o schedule ride for client to neurology appointment. Shann Medal RN BSN CNP [phone]

## 2021-02-11 ENCOUNTER — Encounter (HOSPITAL_COMMUNITY): Payer: Self-pay | Admitting: Psychiatry

## 2021-02-11 ENCOUNTER — Encounter: Payer: Self-pay | Admitting: *Deleted

## 2021-02-24 ENCOUNTER — Ambulatory Visit: Payer: Medicaid Other | Admitting: Diagnostic Neuroimaging

## 2021-03-04 ENCOUNTER — Telehealth: Payer: Self-pay | Admitting: Pediatric Intensive Care

## 2021-03-04 NOTE — Telephone Encounter (Signed)
Message from client's mother. Sara Moon is out of medication. [name] has received messages from pharmacy but doesn't understand them. CN will call [facility] for client. Shann Medal RN BSN CNP [phone]

## 2021-03-04 NOTE — Telephone Encounter (Signed)
Call to [facility] to check status of client's medications per client's mother's request.  Spoke with pharmacy tech. Client has refills left. CN requests get prescriptions ready for pickup. Shann Medal RN BSN CNP [phone]

## 2021-03-11 ENCOUNTER — Telehealth: Payer: Self-pay | Admitting: Pediatric Intensive Care

## 2021-03-11 IMAGING — MR MR CERVICAL SPINE WO/W CM
7 of 9 series · 32 of 48 positions shown · IV contrast (5.5 ml gadavist)
Comparison: Brain MRI this same day.

CLINICAL DATA: Encephalopathy.  Question demyelinating disease.

EXAM:
MRI CERVICAL SPINE WITHOUT AND WITH CONTRAST
TECHNIQUE: Multiplanar and multiecho pulse sequences of the cervical spine, to
include the craniocervical junction and cervicothoracic junction,
were obtained without and with intravenous contrast.
CONTRAST:  5.5 mL GADAVIST IV SOLN

[Series 5: T1 · sagittal · 3.0mm · 0.69mm/px · 4 of 15 slices shown (1 of 2)]
[im 1/15]
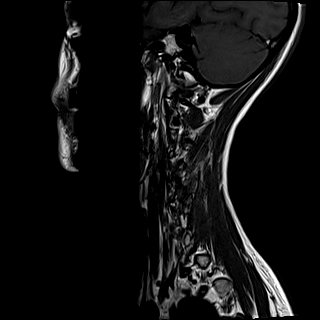
[im 5/15]
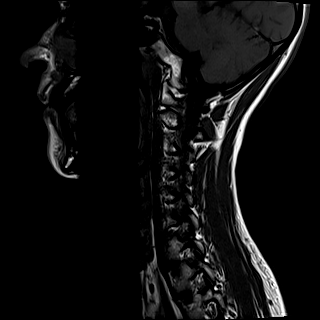
[im 10/15]
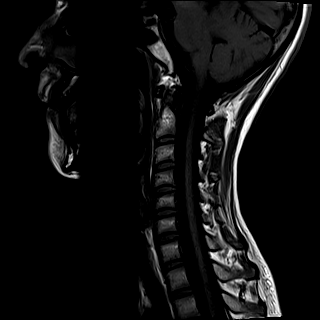
[im 15/15]
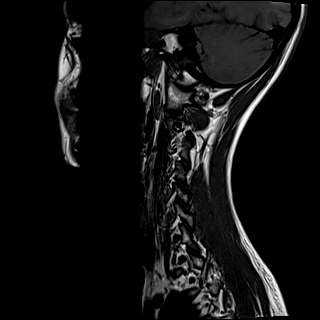

[Series 6: STIR · sagittal · 3.0mm · 0.86mm/px · 3 of 15 slices shown]
[im 1/15]
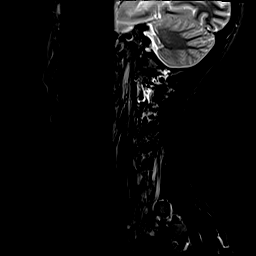
[im 8/15]
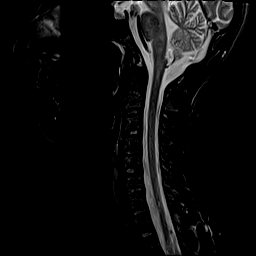
[im 15/15]
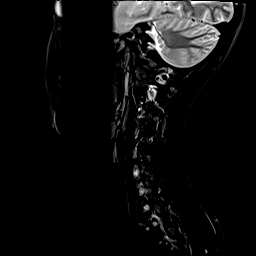

[Series 7: T2 · axial · 3.0mm · 0.70mm/px · z∈[-197,-93]mm · 7 of 31 slices shown]
[im 1/31]
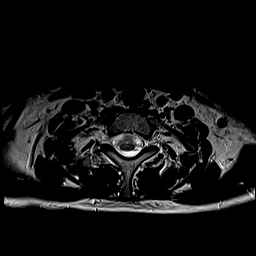
[im 6/31]
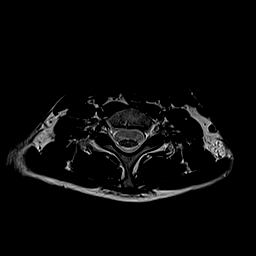
[im 11/31]
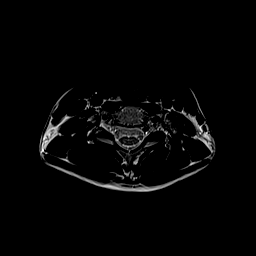
[im 16/31]
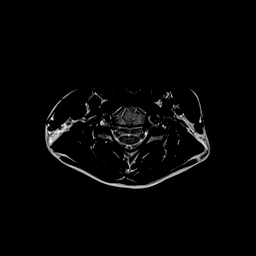
[im 21/31]
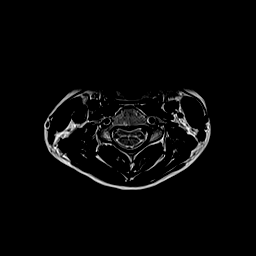
[im 26/31]
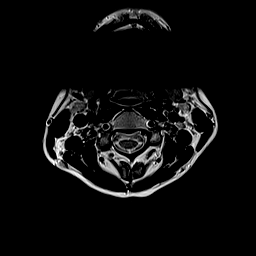
[im 31/31]
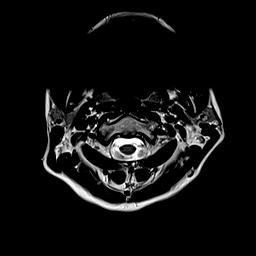

[Series 9: T1 · axial · 3.0mm · 0.35mm/px · z∈[-197,-93]mm · 7 of 30 slices shown (2 of 2)]
[im 1/30]
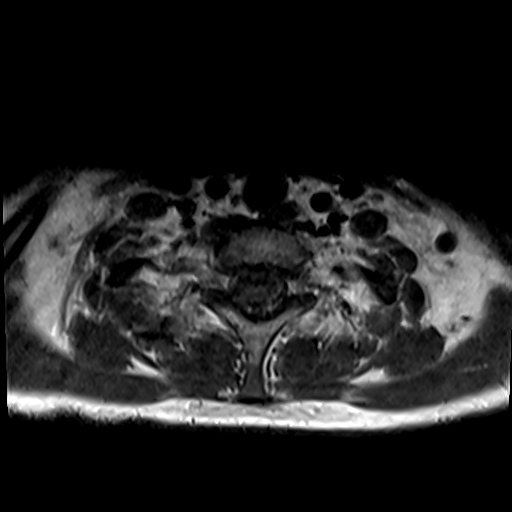
[im 5/30]
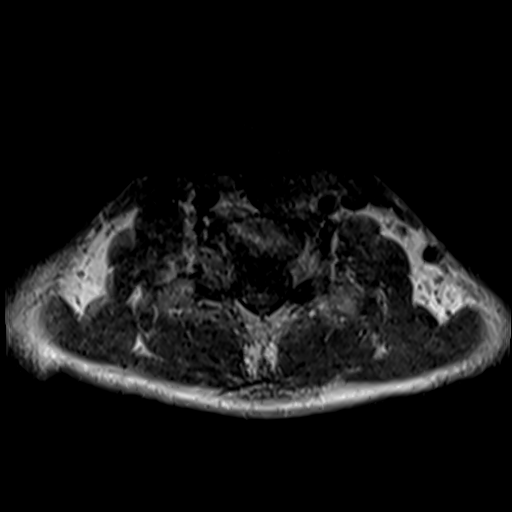
[im 10/30]
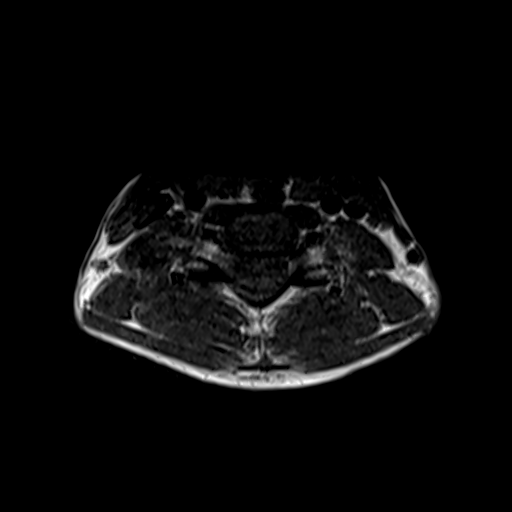
[im 15/30]
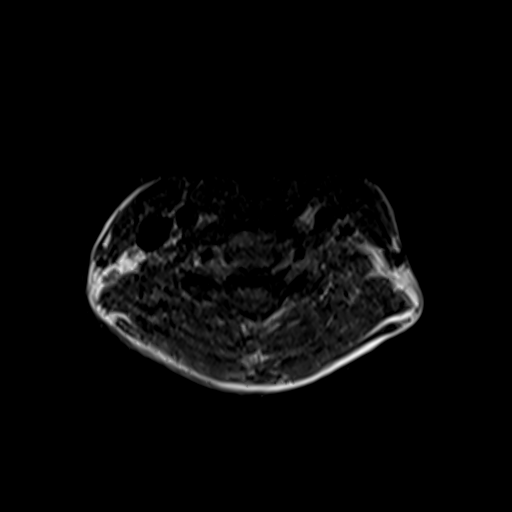
[im 20/30]
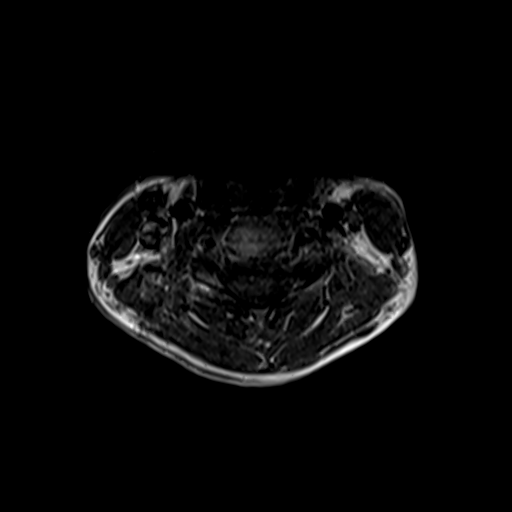
[im 25/30]
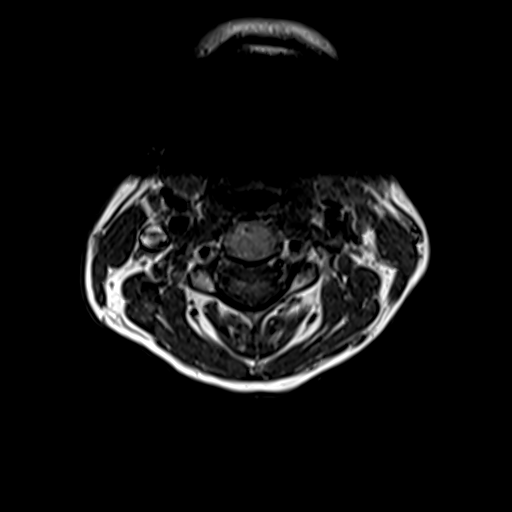
[im 30/30]
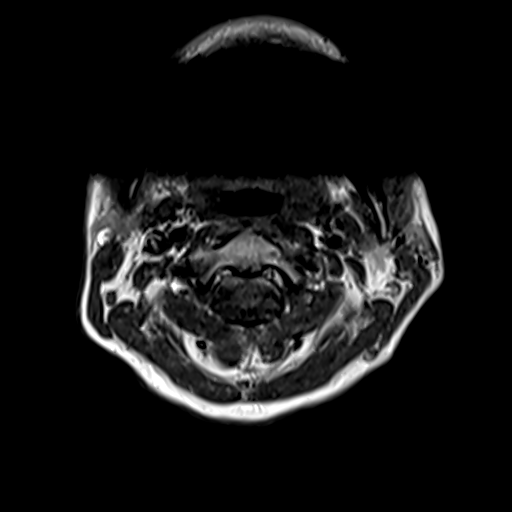

[Series 10: T2 post-contrast · sagittal · 3.0mm · 0.69mm/px · 3 of 15 slices shown]
[im 1/15]
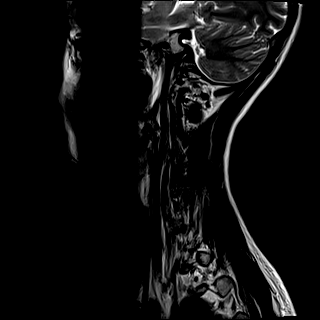
[im 8/15]
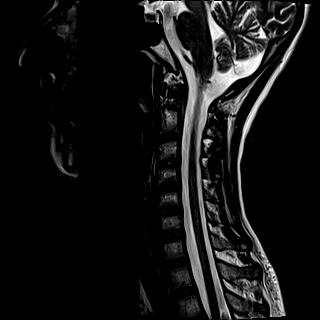
[im 15/15]
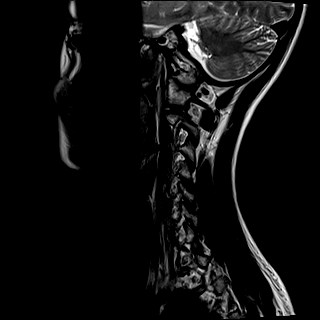

[Series 11: T1 fat-sat post-contrast · sagittal · 3.0mm · 0.69mm/px · 3 of 15 slices shown]
[im 1/15]
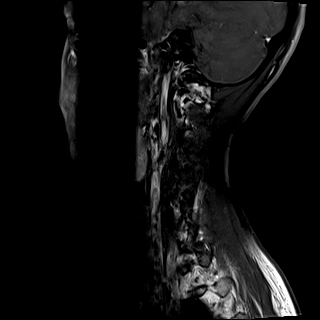
[im 8/15]
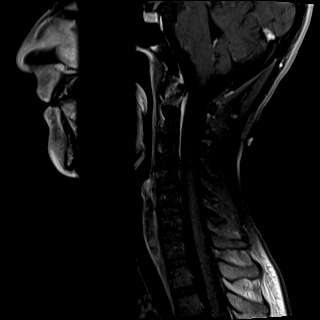
[im 15/15]
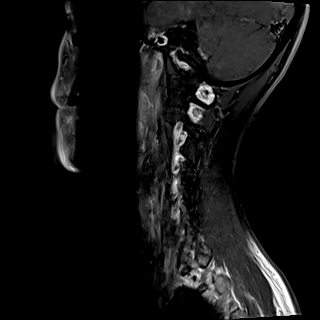

[Series 12: T1 post-contrast · axial · 3.0mm · 0.35mm/px · z∈[-197,-128]mm · 5 of 31 slices shown]
[im 1/31]
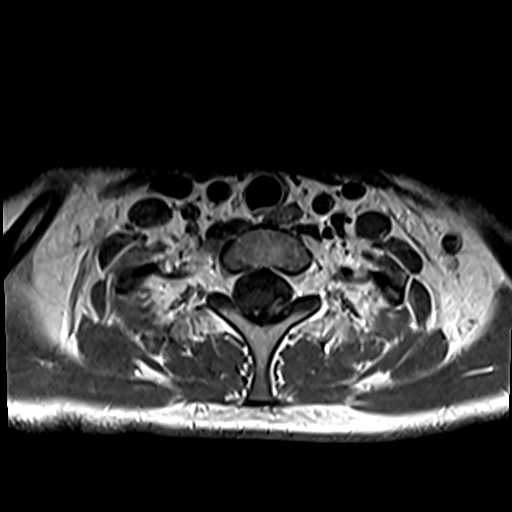
[im 6/31]
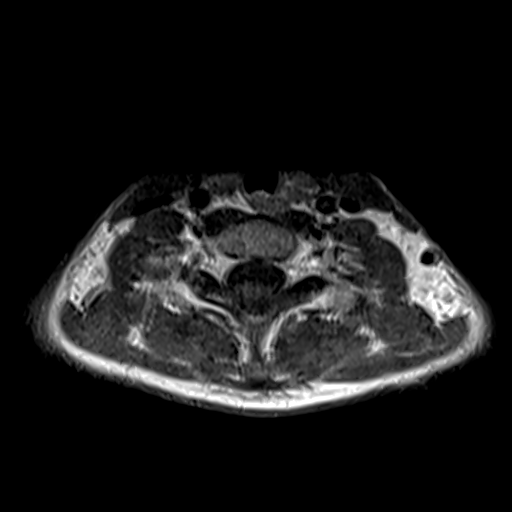
[im 11/31]
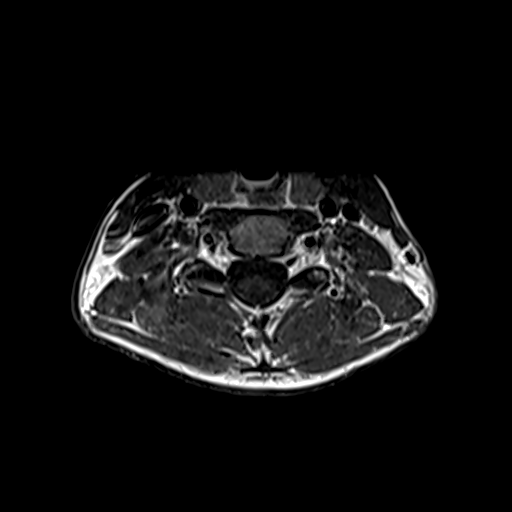
[im 16/31]
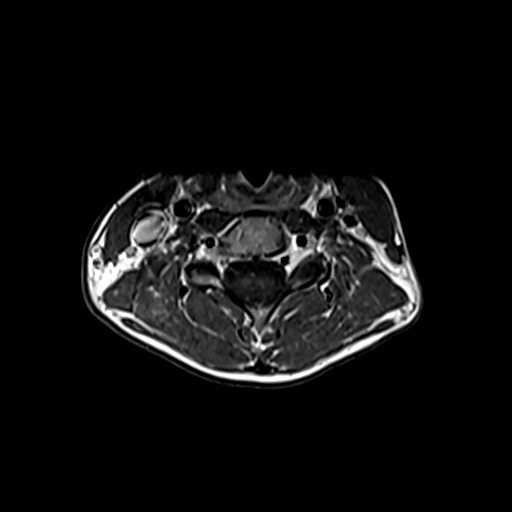
[im 21/31]
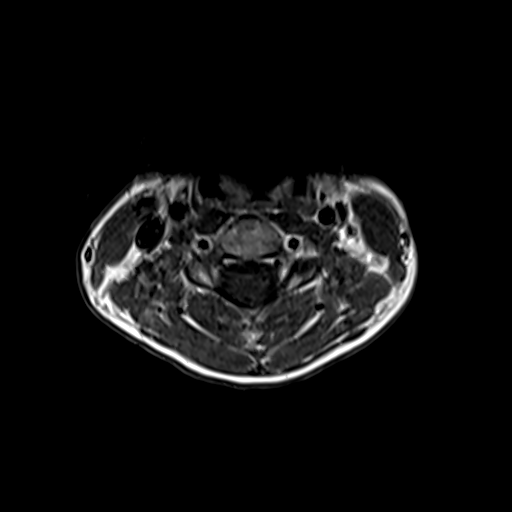

[32 of 48 positions shown; findings below may reference images not displayed]

FINDINGS: Alignment: Normal.

Vertebrae: No fracture, evidence of discitis, or bone lesion.

Cord: Normal signal and morphology. No pathologic enhancement after
contrast administration.

Posterior Fossa, vertebral arteries, paraspinal tissues: Negative.

Disc levels:

Disc height and hydration are maintained at all levels. The central
canal and foramina are widely patent throughout.
IMPRESSION: Negative for demyelinating disease.  Normal cervical spine MRI.

## 2021-03-11 NOTE — Telephone Encounter (Signed)
Spoke with [facility] Patient accounting regarding bill for inpatient admission in February. The bill will be revised with current Medicaid information. Shann Medal RN BSN CNP [phone]

## 2021-03-11 NOTE — Telephone Encounter (Signed)
Email to [employer], Sports coach, at [facility]. Informed [facility]ung that CN spoke with patient accounting regarding bill for February hospital admission. Shann Medal RN BSN CNP [phone]

## 2021-03-20 ENCOUNTER — Ambulatory Visit (HOSPITAL_COMMUNITY): Payer: Medicaid Other | Admitting: Licensed Clinical Social Worker

## 2021-04-10 ENCOUNTER — Encounter: Payer: Self-pay | Admitting: Pediatric Intensive Care

## 2021-04-10 ENCOUNTER — Ambulatory Visit (INDEPENDENT_AMBULATORY_CARE_PROVIDER_SITE_OTHER): Payer: Medicaid Other | Admitting: Licensed Clinical Social Worker

## 2021-04-10 ENCOUNTER — Other Ambulatory Visit: Payer: Self-pay

## 2021-04-10 DIAGNOSIS — F331 Major depressive disorder, recurrent, moderate: Secondary | ICD-10-CM | POA: Diagnosis not present

## 2021-04-10 NOTE — Congregational Nurse Program (Signed)
  Dept: [phone]   Congregational Nurse Program Note  Date of Encounter: 04/10/2021  Past Medical History: Past Medical History:  Diagnosis Date   Concern for seizure disorder [facility])    Depression     Encounter Details:  CNP Questionnaire - 04/10/21 0903       Questionnaire   Do you give verbal consent to treat you today? Yes    Location Patient Served  [name]    Visit Setting Phone/Text/Email    Patient Status Refugee    Insurance [facility]    Insurance Referral N/A    Medication N/A    Medical Provider Yes    Screening Referrals N/A    Medical Referral N/A    Medical Appointment Made N/A    Food N/A    Transportation Need transportation assistance;Provided transportation assistance    Housing/Utilities N/A    Interpersonal Safety N/A    Intervention Case Management    ED Visit Averted N/A    Life-Saving Intervention Made N/A            Received text message from client's mother [name]. Client needs transportation to appointment at 1pm. Appointment verified. Ride scheduled and return text sent to [facility] with ETA for pickup. CN shared transportation services telephone number and advised that they would need to call directly to schedule return ride. Shann Medal RN BSN CcNP [phone]

## 2021-04-15 NOTE — Progress Notes (Signed)
   THERAPIST PROGRESS NOTE  Session Time: 55 min.  Participation Level: Active  Behavioral Response: CasualAlertDepressed  Type of Therapy: Individual Therapy  Treatment Goals addressed: Communication: dep/coping  Interventions: Supportive and Other: additional assessment  Summary: Sara Moon is a 20 y.o. female who presents with hx of MDD.  Today patient returns for in person session. This is the first session since her initial session on August 11.  Patient comes in with [facility] health interpreter and her mother.  Clinician asked mother to wait in the waiting room, which she readily agreed to do.  LCSW explained there were many items not assessed first session due to length of mother's comments and interpreter.  Patient agrees to do screenings which were completed for pain, nutrition, depression.  LCSW assessed for any significant changes since August.  Patient explained through interpreter that she gets upset very easily.  She states she had applied for a job that she did not get and was distressed over this.  She also describes increased anxiety in social situations.  In chart review prior to session LCSW notes patient has been to neurologist with a suspected seizure disorder.  LCSW explored this situation which is being managed.  LCSW assessed for status of patient's medication and medication management.  Learned patient ran out of her meds as she can be forgetful and was managing her own meds.  Patient is back on her medication and mother is in control of medications now.  LCSW assessed for patient's desire to learn [place].  Interpreter advises this has been overwhelming and difficult for patient.  She did think about attending the newcomers group to learn English but has not followed through with this as yet.  Patient reportedly completed the 10th grade in [place] before coming to the [place].  LCSW assessed for patient's typical day.  Patient reportedly gets up fairly early, makes  tea, helps make breakfast, watches TV, reportedly gets tired easily and "sits there and thinks". Pt helps with all meals during each day. Learned patient enjoys cooking very much and gets lots of complements on her cooking from family.  When asked patient agrees that this does make her feel useful which was one of her stated goals in August.  Also learned patient has 2 newborn nephews she is hopeful she can be involved with.  Interpreter who has been following the family for some time reports patient is not allowed to do anything on her own; mother or another family member is with her at all times.  Patient continually circles back to wanting to work to contribute to the household during session. Evaluated desire for pt to return for ongoing counseling. Pt reports she would like to come back and asks for cln opinion. LCSW agrees there are items that warrant further attention. LCSW reviewed poc including scheduling prior to close of session. Pt states appreciation for care.   Suicidal/Homicidal: Nowithout intent/plan  Therapist Response: Pt somewhat receptive to care.  Plan: Return again for next avail appt. Address loss, working within limitations.  Diagnosis: Axis I:  MDD, moderate  [place] S[place], LCSW 04/15/2021

## 2021-04-23 ENCOUNTER — Other Ambulatory Visit: Payer: Self-pay | Admitting: *Deleted

## 2021-04-24 ENCOUNTER — Other Ambulatory Visit (HOSPITAL_COMMUNITY): Payer: Self-pay

## 2021-04-24 MED ORDER — ADULT MULTIVITAMIN W/MINERALS CH: 1.0000 | ORAL_TABLET | Freq: Every day | ORAL | 3 refills | Status: AC

## 2021-04-24 MED ORDER — VITAMIN B-12 1000 MCG PO TABS
ORAL_TABLET | Freq: Every day | ORAL | 0 refills | Status: AC
  Filled 2021-04-24: qty 30, 30d supply, fill #0

## 2021-04-28 ENCOUNTER — Other Ambulatory Visit (HOSPITAL_COMMUNITY): Payer: Self-pay

## 2021-04-29 ENCOUNTER — Encounter (HOSPITAL_COMMUNITY): Payer: No Payment, Other | Admitting: Psychiatry

## 2021-05-06 ENCOUNTER — Telehealth: Payer: Self-pay

## 2021-05-06 NOTE — Telephone Encounter (Signed)
Contacted pharmacy to refill Abilify and Lamictal. Pharmacy is having issues with medicaid. I will call contact medicaid on patient behalf.  Nicole Cella Anees Vanecek RN BSn PCCN  [facility] [phone]-cell [phone]-office

## 2021-05-12 ENCOUNTER — Telehealth: Payer: Self-pay | Admitting: Diagnostic Neuroimaging

## 2021-05-12 ENCOUNTER — Other Ambulatory Visit (HOSPITAL_COMMUNITY): Payer: Self-pay | Admitting: Psychiatry

## 2021-05-12 DIAGNOSIS — F330 Major depressive disorder, recurrent, mild: Secondary | ICD-10-CM

## 2021-05-12 NOTE — Telephone Encounter (Signed)
Pt's mother, Katena Petitjean called, she is out of lamoTRIgine (LAMICTAL) 100 MG tablet. Do not have medicaid at this time, but have reassigned  for medicaid. Would like to discuss with nurse about assistance or some sample she can get.

## 2021-05-13 ENCOUNTER — Telehealth (HOSPITAL_COMMUNITY): Payer: Self-pay | Admitting: *Deleted

## 2021-05-13 NOTE — Telephone Encounter (Signed)
Called patient's mother and reviewed my chart message sent yesterday. Conversation had some language barrier however she stated that no one in family is working now. No one speaks english well enough to call the PA simple fill website to apply for PA. She stated she asked her neighbor last night to buy lamotrigine but it  is too expensive. I advised her I'll contact Dorothy Muhoro RN Congregational RN to see if she has any information re: her note 05/06/21. Advised Ms Swiss will let Dr Marjory Lies know. She stated patient has been out of medicine for a week. Mother verbalized understanding, appreciation. I called Simple Fill, spoke with [place] and explained patient's situation. She suggested I call manufacturer of drug- [employer] PAP,  [phone].  Called [employer], spoke with Dois Davenport who stated that if someone can help patient fill out application she will be considered for free drug. I will send to information to congregational RN.

## 2021-05-13 NOTE — Telephone Encounter (Signed)
[place] TRACKS APPROVED ARIPiprazole (ABILIFY) 5 MG tablet   P.A # [id number]  EFFECTIVE:  05-13-2021 THRU   05-08-2022

## 2021-05-14 ENCOUNTER — Other Ambulatory Visit: Payer: Self-pay | Admitting: *Deleted

## 2021-05-14 ENCOUNTER — Other Ambulatory Visit (HOSPITAL_COMMUNITY): Payer: Self-pay

## 2021-05-14 DIAGNOSIS — G40909 Epilepsy, unspecified, not intractable, without status epilepticus: Secondary | ICD-10-CM

## 2021-05-14 MED ORDER — LAMOTRIGINE 100 MG PO TABS: 100.0000 mg | ORAL_TABLET | Freq: Two times a day (BID) | ORAL | 12 refills | Status: AC

## 2021-05-14 MED ORDER — LAMOTRIGINE 100 MG PO TABS
100.0000 mg | ORAL_TABLET | Freq: Two times a day (BID) | ORAL | 12 refills | Status: DC
  Filled 2021-05-14: qty 30, 15d supply, fill #0

## 2021-05-14 NOTE — Telephone Encounter (Signed)
Met with patient and her mother today. Patient signed ([place] Patient Assistance Program) application form. Form faxed and confirmed receipt. Also, I have picked up medication form [facility] and will deliver to patient home this afternoon.

## 2021-05-14 NOTE — Telephone Encounter (Signed)
Received message from congregational RN, Dorothy: I will assist patient with this today meanwhile please send the prescription to [facility] under [facility] Nurse office and will pick up and deliver to patient today. Old Rx discontinued at [facility] and sent to [facility]. I advised Nicole Cella that [employer] requires a paper Rx with our office cover sheet to be faxed with application. She will notify me when application has been faxed.

## 2021-05-14 NOTE — Telephone Encounter (Signed)
Lamictal Rx signed and faxed to [employer] PAP program. Received confirmation.

## 2021-05-14 NOTE — Congregational Nurse Program (Signed)
Patient has signed [employer] patient assistance program application form for medication assistance.Forme faxed and confirmed receipt. I have contacted patient intensive case management Ms Tollie Eth to assist with medicaid application  [email or web address] ? [phone] Ext. [phone] ? ? [address], [place], [place] [address]  Medication picked up from [facility] and delivered to home.  Nicole Cella Motty Borin RN BSn PCCN  [facility] [phone]-cell [phone]-office

## 2021-05-26 ENCOUNTER — Other Ambulatory Visit (HOSPITAL_COMMUNITY): Payer: Self-pay

## 2021-05-26 ENCOUNTER — Other Ambulatory Visit (HOSPITAL_COMMUNITY): Payer: Self-pay | Admitting: Psychiatry

## 2021-05-26 ENCOUNTER — Telehealth: Payer: Self-pay

## 2021-05-26 DIAGNOSIS — F330 Major depressive disorder, recurrent, mild: Secondary | ICD-10-CM

## 2021-05-26 MED ORDER — ARIPIPRAZOLE 5 MG PO TABS
5.0000 mg | ORAL_TABLET | Freq: Every day | ORAL | 2 refills | Status: AC
  Filled 2021-05-26: qty 30, 30d supply, fill #0

## 2021-05-26 NOTE — Telephone Encounter (Signed)
Patient has ran out of Abilify and unable to refill due to a medicaid issue.I have contacted the nurse line to have prescription rerouted to [facility] for medication assistance.  Nicole Cella Archita Lomeli RN BSn PCCN  [facility] [phone]-cell [phone]-office

## 2021-05-27 ENCOUNTER — Other Ambulatory Visit (HOSPITAL_COMMUNITY): Payer: Self-pay

## 2021-05-27 NOTE — Congregational Nurse Program (Signed)
°  Dept: [phone]   Congregational Nurse Program Note  Date of Encounter: 05/27/2021  Past Medical History: Past Medical History:  Diagnosis Date   Concern for seizure disorder [facility])    Depression     Encounter Details:  Abilify picked up from [facility] and delivered to patient home.  Nicole Cella Lariyah Shetterly RN BSn PCCN  [facility] [phone]-cell [phone]-office

## 2021-06-03 ENCOUNTER — Telehealth: Payer: Self-pay | Admitting: Diagnostic Neuroimaging

## 2021-06-03 NOTE — Telephone Encounter (Signed)
Mailed medical records to patients new address.

## 2021-06-30 ENCOUNTER — Ambulatory Visit (HOSPITAL_COMMUNITY): Payer: No Payment, Other | Admitting: Licensed Clinical Social Worker

## 2021-06-30 ENCOUNTER — Encounter (HOSPITAL_COMMUNITY): Payer: Self-pay

## 2021-07-09 ENCOUNTER — Telehealth: Payer: Self-pay | Admitting: Diagnostic Neuroimaging

## 2021-07-09 NOTE — Telephone Encounter (Signed)
Patient was scheduled 08/06/21 with Dr. Marjory Lies. I called patient and LVM to r/s d/t MD schedule changes.

## 2021-07-11 ENCOUNTER — Encounter (HOSPITAL_COMMUNITY): Payer: Self-pay

## 2021-07-11 ENCOUNTER — Telehealth (HOSPITAL_COMMUNITY): Payer: No Payment, Other | Admitting: Psychiatry

## 2021-08-01 ENCOUNTER — Encounter: Payer: Self-pay | Admitting: Physician Assistant

## 2021-08-04 ENCOUNTER — Ambulatory Visit (HOSPITAL_COMMUNITY): Payer: Self-pay | Admitting: Licensed Clinical Social Worker

## 2021-08-06 ENCOUNTER — Ambulatory Visit: Payer: Medicaid Other | Admitting: Diagnostic Neuroimaging

## 2021-08-06 ENCOUNTER — Other Ambulatory Visit (HOSPITAL_COMMUNITY): Payer: Self-pay

## 2021-08-29 ENCOUNTER — Telehealth (HOSPITAL_COMMUNITY): Payer: Self-pay

## 2021-08-29 NOTE — Telephone Encounter (Signed)
RECEIVED A FAX FROM [facility] STATING THAT THEY DECLINED SERVICES FOR THIS PT AT THIS TIME. THEIR PHONE # IS [phone], FAX # IS [phone] ?

## 2021-09-16 ENCOUNTER — Ambulatory Visit
Payer: Medicaid Other | Attending: Student in an Organized Health Care Education/Training Program | Admitting: Student in an Organized Health Care Education/Training Program

## 2021-09-16 ENCOUNTER — Encounter: Payer: Self-pay | Admitting: Student in an Organized Health Care Education/Training Program

## 2021-09-16 VITALS — BP 94/59 | HR 74 | Ht 65.0 in | Wt 149.0 lb

## 2021-09-16 DIAGNOSIS — G40909 Epilepsy, unspecified, not intractable, without status epilepticus: Secondary | ICD-10-CM | POA: Insufficient documentation

## 2021-09-16 DIAGNOSIS — F32A Depression, unspecified: Secondary | ICD-10-CM

## 2021-09-16 DIAGNOSIS — F419 Anxiety disorder, unspecified: Secondary | ICD-10-CM | POA: Insufficient documentation

## 2021-09-16 DIAGNOSIS — R454 Irritability and anger: Secondary | ICD-10-CM

## 2021-09-16 MED ORDER — LAMOTRIGINE 100 MG PO TABS
100.0000 mg | ORAL_TABLET | Freq: Two times a day (BID) | ORAL | 3 refills | Status: DC
Start: 2021-09-16 — End: 2022-01-22

## 2021-09-16 MED ORDER — MULTIVITAMINS PO CAPS
1.0000 | ORAL_CAPSULE | Freq: Every day | ORAL | 3 refills | Status: DC
Start: 2021-09-16 — End: 2022-03-26

## 2021-09-16 NOTE — Patient Instructions (Addendum)
-   Continue Lamotrigine 100mg  twice daily    - Have lamotrigine blood level checked     - If any additional seizures, we will increase Lamotrigine to 150mg  or 200mg  twice daily    - Continue Abilify per psychiatrist    - Bring MRI and EEG reports to the next visit

## 2021-09-16 NOTE — Progress Notes (Signed)
[facility]  NEW OUTPATIENT ENCOUNTER       Patient Name: Dominique Parker  MRN: [id number]  Primary Care MD: Milagros Reap, MD Phone: [phone]    Date: 09/16/2021     CC:   Chief Complaint   Patient presents with    Seizures       HPI: Clotiel Troop is a 21 y.o. female with PMH developmental delay, depression, and epilepsy who presents today for neurologic consultation regarding her seizures. Here today with her mother and interpreter. Most of the history of from mom.    Age at seizure onset: 46  Handedness: RH      Current AEDs: LTG 100mg  BID    Prior AEDs: LTG - on since 07/2020 (she has been tried on other medications but cannot remember names)      21 years old - first seizure, urinary incontinence, taken to hospital in [place]. Body stiff and shaking, eyes rolled back. +tongue bite. 7-10 minutes.  She had MRI/EEG done, was on medication for 1 year.     She continued to have seizures over the years. Sometimes in 1 month, she could have 2-4 seizures, other months she may not have any    Per mom, she seemed to develop other issues along the way as well aside from the seizures. Sometimes hand was not working, sometimes legs not working, sometimes could not talk.    Also, a year after seizure onset she began having behavioral issues as well, screaming often.    At some point when to doctor in [place], she got different medication, cannot recall the name.    She also started having absence type seizures along the way.    2019 - she had very bad seizure and she stopped speaking, hands and feelt extended and were pointing outwards. She was in hospital for 4 days. Went to hospital in [place] for many months afterwards. Did not give her a formal diagnosis at this point of epilepsy.    2021 - relocated to [place], was in camp. Very bad seizure again. [employer] moved her to [place], went to hospital for 10 days. Did full workup again. Found that she has seizures, given formal diagnosis.    In 2021, she lost her  memory. Did not understand anything.    07/2020 - last tonic clonic seizure. Per mom it was a bad one, she went blue a little bit. Foaming at mouth. She was started on Lamotrigine at this time.    2 months ago - last absence type seizure. She was calling her and she was quiet and not responding. Staring only, no convulsion. During the time of this seizure mom found that she was not being compliant with her lamotrigine.   These staring ones have happened 5-6 times total.   Right now when she has these episodes for 1-2 minutes, afterwards it is clear that she loses memory.    She has psychological problems per mom. She was eventually started on Abilify which she is currently on.    No myoclonic jerks.    She is very nervous all the time    Per patient herself, she says she remembers part of the seizures maybe. She gets angry quickly, does not want others to guide her. She wants to know if she can come off medication and if there is some other solution.    Seizure Type:  GTC  Desc: LOC, TC, +tongue bite, incontinence, foaming  Duration: first one 7-10 minutes, later ones  2 minutes or less  Frequency: first at age 88; thereafter sometimes in 1 month, she could have 2-4 seizures, other months she may not have any. Last was 07/2020.    Seizure Type: absence vs focal impaired awareness  Desc: staring, not responding  Duration: 1-2 minutes  Frequency: 5-6 time total    Epilepsy Risk Factors:  Birth Weight: normal  Pregnancy Complications: none  Development:  delayed - walking/talking closer to 21yo  History of CNS Infection: no  History of Significant Head Trauma (LOC,Amnesia): no  Febrile Convulsion: no; maybe, some shaking  Family History of Epilepsy: no    Psychiatric History:  yes  Gets angry quickly    Medications:  Current Outpatient Medications on File Prior to Visit   Medication Sig Dispense Refill    ARIPiprazole (ABILIFY) 5 MG tablet Take 5 mg by mouth daily      vitamin B-12 (CYANOCOBALAMIN) 1000 MCG tablet Take  1,000 mcg by mouth daily      [DISCONTINUED] lamoTRIgine (LaMICtal) 100 MG tablet Take 100 mg by mouth 2 (two) times daily      [DISCONTINUED] Multiple Vitamin (multivitamin) capsule Take 1 capsule by mouth daily       No current facility-administered medications on file prior to visit.       Past Medical History:  Past Medical History:   Diagnosis Date    Convulsions     Depression        Past Surgical History:  Past Surgical History:   Procedure Laterality Date    APPENDECTOMY (OPEN)         Allergies:  Patient has no known allergies.    Family History:  History reviewed. No pertinent family history.    Social History:  Social History     Tobacco Use    Smoking status: Never    Smokeless tobacco: Never   Vaping Use    Vaping status: Never Used   Substance Use Topics    Alcohol use: Never    Drug use: Never       Other Social History:  Housing: lives with mom and dad, son, daughter in law  Driving: no  Education Level: 10th grade  Employment: not working      Review of Systems:  Constitutional: Negative for fever.   Respiratory: Negative for shortness of breath.    Cardiovascular: Negative for chest pain.   Gastrointestinal: Negative for abdominal pain.   Neurological: Negative for visual changes, speech or language problems, facial droop, focal weakness, focal numbness, limb incoordination, gait ataxia, dysphagia.  Psychiatric/Behavioral: Negative for acute mood disturbance.   All other systems reviewed and are negative except pertinent positives as noted above in HPI.     PHYSICAL EXAM:    General Exam:  BP 94/59 (BP Site: Left arm, Patient Position: Sitting, Cuff Size: Small)   Pulse 74   Ht 1.651 m (5\' 5" )   Wt 67.6 kg (149 lb)   LMP 07/21/2021 (Approximate)   SpO2 99%   BMI 24.79 kg/m   Gen:  Well-developed, well-nourished.  No acute distress.  Cooperative with exam.  HEENT:  Normocephalic, atraumatic. Trachea midline  Neck:  Supple. Normal range of motion.   CV:  RRR.  S1/S2.    Lungs:  Normal  effort.  Extrem:  No edema.  Skin:  No obvious rashes or lesions.   Psych: Mood and affect are appropriate.    Neuro Exam:  MENTAL STATUS:  Awake, alert, oriented x 3.  Follows commands.  Speech is fluent, non dysarthric.    CRANIAL NERVES:    CN I - Not tested.   CN II - Visual fields are full to confrontation.   CN III, IV, VI - PERRL.  EOMI.  No nystagmus.   CN V - Intact facial sensation to light touch.  CN VII - Facial movements symmetric.   CN VIII - Hearing intact to conversational speech.   CN IX, X - Normal phonation.  CN XI - Shoulder shrug symmetric.   CN XII - Tongue protrusion midline  MOTOR:  Normal bulk and tone.  No pronator drift.  Strength is full (5/5) and symmetric.  No asterixis, tremor, myoclonus or other abnormal movements.   SENSATION:  Intact to light touch, pin prick, temperature, and vibratory sense in upper and lower extremities bilaterally.   REFLEXES: 2+ throughout (biceps 2+, triceps 2+, brachioradialis 2+, patellar 2+, achilles 2+ bilaterally).  COORDINATION: Finger nose finger without dysmetria. Rapid alternating movements are smooth and fast.   GAIT:  Normal gait and station. Normal tandem.       INVESTIGATIONS:    Labs:    Results for orders placed or performed during the hospital encounter of 10/21/20   COVID-19 (SARS-COV-2) ([facility] Now)- Behavioral health admission (no isolation)    Specimen: Nasopharynx; Nasopharyngeal Swab   Result Value Ref Range    Purpose of COVID testing Screening     SARS-CoV-2 Specimen Source Nasopharyngeal     SARS CoV-2 Negative    CBC and differential   Result Value Ref Range    WBC 4.80 3.10 - 9.50 x10 3/uL    Hgb 12.5 11.4 - 14.8 g/dL    Hematocrit 16.1 09.6 - 43.7 %    Platelets 305 142 - 346 x10 3/uL    RBC 4.54 3.90 - 5.10 x10 6/uL    MCV 84.4 78.0 - 96.0 fL    MCH 27.5 25.1 - 33.5 pg    MCHC 32.6 31.5 - 35.8 g/dL    RDW 13 11 - 15 %    MPV 10.2 8.9 - 12.5 fL    Neutrophils 69.0 None %    Lymphocytes Automated 20.2 None %    Monocytes 7.5 None %     Eosinophils Automated 1.9 None %    Basophils Automated 0.6 None %    Immature Granulocytes 0.8 None %    Nucleated RBC 0.0 0.0 - 0.0 /100 WBC    Neutrophils Absolute 3.31 1.10 - 6.33 x10 3/uL    Lymphocytes Absolute Automated 0.97 0.42 - 3.22 x10 3/uL    Monocytes Absolute Automated 0.36 0.21 - 0.85 x10 3/uL    Eosinophils Absolute Automated 0.09 0.00 - 0.44 x10 3/uL    Basophils Absolute Automated 0.03 0.00 - 0.08 x10 3/uL    Immature Granulocytes Absolute 0.04 0.00 - 0.07 x10 3/uL    Absolute NRBC 0.00 0.00 - 0.00 x10 3/uL   Comprehensive metabolic panel   Result Value Ref Range    Glucose 115 (H) 70 - 100 mg/dL    BUN 9.0 7.0 - 04.5 mg/dL    Creatinine 0.7 0.6 - 1.0 mg/dL    Sodium 409 811 - 914 mEq/L    Potassium 3.8 3.5 - 5.1 mEq/L    Chloride 105 100 - 111 mEq/L    CO2 25 22 - 29 mEq/L    Calcium 9.9 8.5 - 10.5 mg/dL    Protein, Total 8.0 6.0 - 8.3 g/dL    Albumin 4.6 3.5 - 5.0  g/dL    AST (SGOT) 18 5 - 34 U/L    ALT 12 0 - 55 U/L    Alkaline Phosphatase 57 37 - 117 U/L    Bilirubin, Total 0.7 0.2 - 1.2 mg/dL    Globulin 3.4 2.0 - 3.6 g/dL    Albumin/Globulin Ratio 1.4 0.9 - 2.2    Anion Gap 10.0 5.0 - 15.0   Ethanol (Alcohol) Level   Result Value Ref Range    Alcohol NONE DETECTED None Detected mg/dL   Acetaminophen Level   Result Value Ref Range    Acetaminophen Level <7 (L) 10 - 30 ug/mL   Salicylate Level   Result Value Ref Range    Salicylate Level <5.0 (L) 15.0 - 30.0 mg/dL   Urinalysis Reflex to Microscopic Exam- Reflex to Culture   Result Value Ref Range    Urine Type Urine, Clean Ca     Color, UA Yellow Colorless - Yellow    Clarity, UA Cloudy (A) Clear - Hazy    Specific Gravity UA 1.004 1.001 - 1.035    Urine pH 7.0 5.0 - 8.0    Leukocyte Esterase, UA Negative Negative    Nitrite, UA Negative Negative    Protein, UR Negative Negative    Glucose, UA Negative Negative    Ketones UA Negative Negative    Urobilinogen, UA Normal 0.2 - 2.0 mg/dL    Bilirubin, UA Negative Negative    Blood, UA Negative  Negative   Rapid drug screen, urine   Result Value Ref Range    Urine Amphetamine Screen Negative     Barbiturate Screen, UR Negative     Benzodiazepine Screen, UR Negative     Cannabinoid Screen, UR Negative     Cocaine, UR Negative     Opiate Screen, UR Negative     PCP Screen, UR Negative    TSH   Result Value Ref Range    TSH 0.84 0.35 - 4.94 uIU/mL   GFR   Result Value Ref Range    EGFR >60.0    Urine HCG, POC/ Qualitative   Result Value Ref Range    POCT QC Pass     POCT Pregnancy HCG Test, UR Negative Negative    Comment:       Negative Value is Normal in Healthy Males or Healthy non-pregnant Females   ECG 12 lead   Result Value Ref Range    Ventricular Rate 105 BPM    Atrial Rate 105 BPM    P-R Interval 144 ms    QRS Duration 76 ms    Q-T Interval 318 ms    QTC Calculation (Bezet) 420 ms    P Axis 62 degrees    R Axis 55 degrees    T Axis 56 degrees    [facility] MUSE NARRATIVE AND IMPRESSION       SINUS TACHYCARDIA  OTHERWISE NORMAL ECG  NO PREVIOUS ECGS AVAILABLE  Confirmed by Graciella Belton MD, JONATHAN ([id number]) on 10/21/2020 4:18:34 PM       Imaging:     No results found for this or any previous visit.      ASSESSMENT AND PLAN:  Jasmon Mattice is a 21 y.o. female  with PMH developmental delay, anxiety/depression, and epilepsy who began having seizures at age 1 (tonic clonic initially and 5-6 lifetime absence type seizures). She was tried on different medications, but mom cannot recall the names. She was eventually started on Lamotrigine in 07/2020 after her last GTC seizure which  was after relocating to the [place]. She had a absence type seizure 2 months ago but mom found out she was not being compliant with her Lamotrigine 100mg  BID at the time. They prefer that she continued on 100mg  BID at this time. She will have blood level drawn. Mom will obtain prior EEG/MRI reports and if we cannot obtain then will repeat studies.    1. Nonintractable epilepsy without status epilepticus, unspecified epilepsy type  Lamotrigine level     CBC and differential    Comprehensive metabolic panel    Vitamin B12 And Folate      2. Anxiety and depression        3. Irritability            - Continue Lamotrigine 100mg  twice daily    - Have lamotrigine blood level checked     - If any additional seizures, we will increase Lamotrigine to 150mg  or 200mg  twice daily    - Continue Abilify per psychiatrist    - Bring MRI and EEG reports to the next visit      I have spent 60 minutes on the day of service including face to face time with patient, coordinating care, record review, and documentation.     Diannia Ruder, MD  [facility]  Board Certified in Adult Neurology, Epilepsy, and Clinical Neurophysiology by the ABPN

## 2021-12-17 ENCOUNTER — Telehealth: Payer: Self-pay | Admitting: Student in an Organized Health Care Education/Training Program

## 2021-12-17 ENCOUNTER — Ambulatory Visit
Payer: No Typology Code available for payment source | Admitting: Student in an Organized Health Care Education/Training Program

## 2021-12-17 NOTE — Telephone Encounter (Signed)
PATIENT requested a refill for the following medication    Name, strength, directions of requested refill(s):  lamoTRIgine (LaMICtal) 100 MG tablet    How much medication is remaining:  0    Pharmacy to send refill to or patient to pick up rx from office (mark requested pharmacy in BOLD):      [id number] - [place], [place] - [address] [facility]  [address]  [address]  [place] [address]  Phone: [phone] Fax: [phone]        Please mark "X" next to the preferred call back number:    Mobile: There is no such number on file (mobile).    Home: [phone] (home) X   Work: @WORKPHONE @        Medication refill request, see above. Thank you   Patient has been informed that medication refill requests should be called in up to one week prior to running out of medication.    Additional Notes:  Next Visit:  01/19/22

## 2021-12-19 NOTE — Telephone Encounter (Signed)
Prescription with current refills  lamoTRIgine (LaMICtal) 100 MG tablet 180 tablet 3 09/16/2021 09/16/2022    Sig - Route: Take 1 tablet (100 mg) by mouth 2 (two) times daily - Oral    Sent to pharmacy as: lamoTRIgine (LaMICtal) 100 MG tablet    Class: E-Rx    E-Prescribing Status: Receipt confirmed by pharmacy (09/16/2021 11:28 AM EDT)

## 2022-01-19 ENCOUNTER — Ambulatory Visit
Payer: No Typology Code available for payment source | Admitting: Student in an Organized Health Care Education/Training Program

## 2022-01-22 ENCOUNTER — Ambulatory Visit
Payer: 59 | Attending: Student in an Organized Health Care Education/Training Program | Admitting: Student in an Organized Health Care Education/Training Program

## 2022-01-22 ENCOUNTER — Encounter: Payer: Self-pay | Admitting: Student in an Organized Health Care Education/Training Program

## 2022-01-22 VITALS — BP 92/59 | HR 64 | Temp 98.1°F | Ht 63.78 in | Wt 153.0 lb

## 2022-01-22 DIAGNOSIS — F32A Depression, unspecified: Secondary | ICD-10-CM | POA: Insufficient documentation

## 2022-01-22 DIAGNOSIS — G40919 Epilepsy, unspecified, intractable, without status epilepticus: Secondary | ICD-10-CM | POA: Insufficient documentation

## 2022-01-22 DIAGNOSIS — F419 Anxiety disorder, unspecified: Secondary | ICD-10-CM | POA: Insufficient documentation

## 2022-01-22 DIAGNOSIS — R454 Irritability and anger: Secondary | ICD-10-CM | POA: Insufficient documentation

## 2022-01-22 MED ORDER — LAMOTRIGINE 100 MG PO TABS
100.0000 mg | ORAL_TABLET | Freq: Two times a day (BID) | ORAL | 3 refills | Status: DC
Start: 2022-01-22 — End: 2022-08-12

## 2022-01-22 NOTE — Progress Notes (Signed)
[facility] - EPILEPSY FOLLOW UP VISIT     Patient Name: Dominique Parker  MRN: [id number]  Primary Care MD: Milagros Reap, MD Phone: [phone]    Date: 01/22/2022     Diagnosis: Epilepsy  Chief Complaint   Patient presents with    Follow-up    Seizures       Current AEDs: LTG 100mg  BID     Prior AEDs: LTG - on since 07/2020 (she has been tried on other medications but cannot remember names)      Seizure History: Dominique Parker is a 21 y.o. female with PMH developmental delay, anxiety/depression, and epilepsy who began having seizures at age 78 (tonic clonic initially and 5-6 lifetime absence type seizures). She was tried on different medications, but mom cannot recall the names. She was eventually started on Lamotrigine in 07/2020 after her last GTC seizure which was after relocating to the [place]. She had a absence type seizure 2 months ago but mom found out she was not being compliant with her Lamotrigine 100mg  BID at the time. They prefer that she continued on 100mg  BID at this time. She will have blood level drawn. Mom will obtain prior EEG/MRI reports and if we cannot obtain then will repeat studies.      Interval History: LOV 09/16/21    She has been having a small seizures once every 1-2 weeks where she has behavioral arrest and is not responding.     01/20/22 - most recent seizure. She did not know where she was, like memory loss. She did not know how she got down the stairs. No convulsion.  Per mom, she was looking at her and she had the phone and went out of the home, luckily they got her back inside. It was for 5 minutes she is not aware and cannot speak. Her eyes do get red. Not moving her hands and feet.    Still on Lamotrigine 100mg  BID. She states no missed dosages, only today when coming here she did not take it because she did not have breakfast yet. BUT mom states she controls the lamotrigine administration and the bottle is often very full so it is likely she does not take it al the time.     She  does not want to increase the dosage. She knows that when she gets very angry and loses control and then she has the seizure. So that is why she does not want to increase since she knows the cause. It starts with blurry vision and after 2-3 hours she has the seizure.    The last time she left home and went out of home something happens to her left hand, she has severe pain there and feels like needles. Sometimes the pain is so severe her hand gets paralyzed.    She really does not want to increase the dosage. She is saying there is some other stuff that triggers her to get angry.    LTG level 09/30/21 - 4.5    She is on Abilify 2mg . Seems to not have a good relationship with her psychiatrist.    Seizure Type:  GTC  Desc: LOC, TC, +tongue bite, incontinence, foaming  Duration: first one 7-10 minutes, later ones 2 minutes or less  Frequency: first at age 27; thereafter sometimes in 1 month, she could have 2-4 seizures, other months she may not have any. Last was 07/2020.     Seizure Type: absence vs focal impaired awareness  Desc: staring, not  responding  Duration: 1-2 minutes  Frequency: 01/20/22, 1 every 1-2 weeks; 5-6 time total    Medications:  Current Outpatient Medications on File Prior to Visit   Medication Sig Dispense Refill    ARIPiprazole (ABILIFY) 2 MG tablet Take 1 tablet (2 mg) by mouth daily      Multiple Vitamin (multivitamin) capsule Take 1 capsule by mouth daily 90 capsule 3    vitamin B-12 (CYANOCOBALAMIN) 1000 MCG tablet Take 1,000 mcg by mouth daily      [DISCONTINUED] ARIPiprazole (ABILIFY) 5 MG tablet Take 5 mg by mouth daily      [DISCONTINUED] lamoTRIgine (LaMICtal) 100 MG tablet Take 1 tablet (100 mg) by mouth 2 (two) times daily 180 tablet 3     No current facility-administered medications on file prior to visit.       Past Medical History:   Diagnosis Date    Convulsions     Depression        History reviewed. No pertinent family history.    Past Surgical History:   Procedure Laterality Date     APPENDECTOMY (OPEN)         Social History     Tobacco Use    Smoking status: Never    Smokeless tobacco: Never   Vaping Use    Vaping Use: Never used   Substance Use Topics    Alcohol use: Never    Drug use: Never       Other Social History:  Housing: lives with mom and dad, son, daughter in law  Driving: no  Education Level: 10th grade  Employment: not working    Allergies: No Known Allergies    REVIEW OF SYSTEMS:  Constitutional: Negative for fever.   Respiratory: Negative for shortness of breath.    Cardiovascular: Negative for chest pain.   Gastrointestinal: Negative for abdominal pain.   Neurological: Negative for visual changes, speech or language problems, facial droop, focal weakness, focal numbness, limb incoordination, gait ataxia, dysphagia, or dizziness.   Psychiatric/Behavioral: Negative for mood disturbance.   All other systems reviewed and are negative except pertinent positives as noted above in HPI.     PHYSICAL EXAM:  BP 92/59   Pulse 64   Temp 98.1 F (36.7 C)   Ht 1.62 m (5' 3.78")   Wt 69.4 kg (153 lb)   LMP  (LMP Unknown)   BMI 26.44 kg/m   Gen:  Well-developed, well-nourished.  No acute distress.  Cooperative with exam.  HEENT:  Normocephalic, atraumatic. Trachea midline  Neck: Normal range of motion.   CV:  RRR.  S1/S2.    Lungs:  Normal effort.  Extrem:  No edema.  Skin:  No obvious rashes or lesions.   Psych: Mood and affect are appropriate.    Neuro Exam:  MENTAL STATUS:  Awake, alert, oriented.  Follows commands.  Speech is fluent, non dysarthric.  CRANIAL NERVES:    CN III, IV, VI - PERRL.  EOMI.  No nystagmus.   CN V - Intact facial sensation to light touch.  CN VII - Facial movements symmetric.   CN VIII - Hearing intact to conversational speech.   CN XII - Tongue protrusion midline and without fasciculations.   MOTOR:  Normal bulk and tone.  No pronator drift.  Strength is full (5/5) and symmetric  SENSATION:  Intact to light touch in upper and lower extremities bilaterally.    REFLEXES: 2+ throughout   COORDINATION: Finger nose finger without dysmetria.  GAIT: Normal  gait      LAB & TEST RESULTS:    LTG level 09/30/21 - 4.5    Results for orders placed or performed during the hospital encounter of 10/21/20   COVID-19 (SARS-COV-2) ([facility])- Behavioral health admission (no isolation)    Specimen: Nasopharynx; Nasopharyngeal Swab   Result Value Ref Range    Purpose of COVID testing Screening     SARS-CoV-2 Specimen Source Nasopharyngeal     SARS CoV-2 Negative    CBC and differential   Result Value Ref Range    WBC 4.80 3.10 - 9.50 x10 3/uL    Hgb 12.5 11.4 - 14.8 g/dL    Hematocrit 96.0 45.4 - 43.7 %    Platelets 305 142 - 346 x10 3/uL    RBC 4.54 3.90 - 5.10 x10 6/uL    MCV 84.4 78.0 - 96.0 fL    MCH 27.5 25.1 - 33.5 pg    MCHC 32.6 31.5 - 35.8 g/dL    RDW 13 11 - 15 %    MPV 10.2 8.9 - 12.5 fL    Neutrophils 69.0 None %    Lymphocytes Automated 20.2 None %    Monocytes 7.5 None %    Eosinophils Automated 1.9 None %    Basophils Automated 0.6 None %    Immature Granulocytes 0.8 None %    Nucleated RBC 0.0 0.0 - 0.0 /100 WBC    Neutrophils Absolute 3.31 1.10 - 6.33 x10 3/uL    Lymphocytes Absolute Automated 0.97 0.42 - 3.22 x10 3/uL    Monocytes Absolute Automated 0.36 0.21 - 0.85 x10 3/uL    Eosinophils Absolute Automated 0.09 0.00 - 0.44 x10 3/uL    Basophils Absolute Automated 0.03 0.00 - 0.08 x10 3/uL    Immature Granulocytes Absolute 0.04 0.00 - 0.07 x10 3/uL    Absolute NRBC 0.00 0.00 - 0.00 x10 3/uL   Comprehensive metabolic panel   Result Value Ref Range    Glucose 115 (H) 70 - 100 mg/dL    BUN 9.0 7.0 - 09.8 mg/dL    Creatinine 0.7 0.6 - 1.0 mg/dL    Sodium 119 147 - 829 mEq/L    Potassium 3.8 3.5 - 5.1 mEq/L    Chloride 105 100 - 111 mEq/L    CO2 25 22 - 29 mEq/L    Calcium 9.9 8.5 - 10.5 mg/dL    Protein, Total 8.0 6.0 - 8.3 g/dL    Albumin 4.6 3.5 - 5.0 g/dL    AST (SGOT) 18 5 - 34 U/L    ALT 12 0 - 55 U/L    Alkaline Phosphatase 57 37 - 117 U/L    Bilirubin, Total 0.7 0.2 -  1.2 mg/dL    Globulin 3.4 2.0 - 3.6 g/dL    Albumin/Globulin Ratio 1.4 0.9 - 2.2    Anion Gap 10.0 5.0 - 15.0   Ethanol (Alcohol) Level   Result Value Ref Range    Alcohol NONE DETECTED None Detected mg/dL   Acetaminophen Level   Result Value Ref Range    Acetaminophen Level <7 (L) 10 - 30 ug/mL   Salicylate Level   Result Value Ref Range    Salicylate Level <5.0 (L) 15.0 - 30.0 mg/dL   Urinalysis Reflex to Microscopic Exam- Reflex to Culture   Result Value Ref Range    Urine Type Urine, Clean Ca     Color, UA Yellow Colorless - Yellow    Clarity, UA Cloudy (A) Clear - Hazy  Specific Gravity UA 1.004 1.001 - 1.035    Urine pH 7.0 5.0 - 8.0    Leukocyte Esterase, UA Negative Negative    Nitrite, UA Negative Negative    Protein, UR Negative Negative    Glucose, UA Negative Negative    Ketones UA Negative Negative    Urobilinogen, UA Normal 0.2 - 2.0 mg/dL    Bilirubin, UA Negative Negative    Blood, UA Negative Negative   Rapid drug screen, urine   Result Value Ref Range    Urine Amphetamine Screen Negative     Barbiturate Screen, UR Negative     Benzodiazepine Screen, UR Negative     Cannabinoid Screen, UR Negative     Cocaine, UR Negative     Opiate Screen, UR Negative     PCP Screen, UR Negative    TSH   Result Value Ref Range    TSH 0.84 0.35 - 4.94 uIU/mL   GFR   Result Value Ref Range    EGFR >60.0    Urine HCG, POC/ Qualitative   Result Value Ref Range    POCT QC Pass     POCT Pregnancy HCG Test, UR Negative Negative    Comment:       Negative Value is Normal in Healthy Males or Healthy non-pregnant Females   ECG 12 lead   Result Value Ref Range    Ventricular Rate 105 BPM    Atrial Rate 105 BPM    P-R Interval 144 ms    QRS Duration 76 ms    Q-T Interval 318 ms    QTC Calculation (Bezet) 420 ms    P Axis 62 degrees    R Axis 55 degrees    T Axis 56 degrees    [facility] MUSE NARRATIVE AND IMPRESSION       SINUS TACHYCARDIA  OTHERWISE NORMAL ECG  NO PREVIOUS ECGS AVAILABLE  Confirmed by Graciella Belton MD, JONATHAN ([id number]) on  10/21/2020 4:18:34 PM         IMAGES:   Independently reviewed:    No results found for this or any previous visit.      ASSESSMENT AND PLAN:  Dominique Parker is a 21 y.o. female  with PMH developmental delay, anxiety/depression, and epilepsy who began having seizures at age 85 (tonic clonic initially and 5-6 lifetime absence type seizures). She was tried on different medications, but mom cannot recall the names. She was eventually started on Lamotrigine in 07/2020 after her last GTC seizure which was after relocating to the [place]. She had a absence type seizure 2 months ago but mom found out she was not being compliant with her Lamotrigine 100mg  BID at the time. They prefer that she continued on 100mg  BID at this time.   LTG level 09/30/21 - 4.5    She has a small focal impaired awareness seizure once every 1-2 weeks per mom. Per mom, she feels she likely does not take her medication consistently because the amount of pills in the bottle does not seem to change much. She is extremely hesitant to increase her medication in general because she feels her seizures are triggered by anger for the most part and is insisting on not increasing. Her blood level back in April was 4.5 on 100mg  BID. We discussed that if she was not reliably taking the medication, then she can continue on 100mg  BID for now. After taking it consistently for a month, we can reassess and if she is still having seizures, then  increase to 150mg  BID.       1. Intractable epilepsy without status epilepticus, unspecified epilepsy type        2. Anxiety and depression        3. Irritability          - Continue Lamotrigine 100mg  twice daily    - Can try to see psychiatrist Dr. Unknown Foley: [phone]      I have spent 40 minutes reviewing the record, interviewing and examining the patient, and documenting the encounter.     Diannia Ruder, MD  [facility]  Board Certified in Adult Neurology, Epilepsy, and Clinical Neurophysiology by the  ABPN

## 2022-01-22 NOTE — Patient Instructions (Signed)
-   Continue Lamotrigine 100mg  twice daily    - Can try to see psychiatrist Dr. Unknown Foley: [phone]

## 2022-02-16 ENCOUNTER — Emergency Department
Admission: EM | Admit: 2022-02-16 | Discharge: 2022-02-16 | Disposition: A | Discharge status: Home / Self Care | Payer: 59 | Attending: Emergency Medicine | Admitting: Emergency Medicine

## 2022-02-16 DIAGNOSIS — U071 COVID-19: Secondary | ICD-10-CM | POA: Insufficient documentation

## 2022-02-16 LAB — COVID-19 (SARS-COV-2) & INFLUENZA  A/B, NAA (ROCHE LIAT)
Influenza A: NOT DETECTED
Influenza B: NOT DETECTED
SARS CoV 2 Overall Result: DETECTED — AB

## 2022-02-16 MED ORDER — ONDANSETRON 4 MG PO TBDP: 4.0000 mg | ORAL_TABLET | Freq: Four times a day (QID) | ORAL | 0 refills | Status: DC | PRN

## 2022-02-16 MED ORDER — IBUPROFEN 600 MG PO TABS
600.0000 mg | ORAL_TABLET | Freq: Once | ORAL | Status: AC
Start: 2022-02-16 — End: 2022-02-16
  Administered 2022-02-16: 600 mg via ORAL
  Filled 2022-02-16: qty 1

## 2022-02-16 MED ORDER — ACETAMINOPHEN 325 MG PO TABS
650.0000 mg | ORAL_TABLET | Freq: Four times a day (QID) | ORAL | 0 refills | Status: DC | PRN
Start: 2022-02-16 — End: 2022-12-18

## 2022-02-16 MED ORDER — ACETAMINOPHEN 500 MG PO TABS
1000.0000 mg | ORAL_TABLET | Freq: Once | ORAL | Status: AC
Start: 2022-02-16 — End: 2022-02-16
  Administered 2022-02-16: 1000 mg via ORAL
  Filled 2022-02-16: qty 2

## 2022-02-16 NOTE — ED Provider Notes (Signed)
EMERGENCY DEPARTMENT NOTE     Patient initially seen and examined at   ED PHYSICIAN ASSIGNED       Date/Time Event User Comments    07/13/21 1536 Physician Assigned Windell Hummingbird. Windell Hummingbird, DO assigned as Attending           ED MIDLEVEL (APP) ASSIGNED       None            HISTORY OF PRESENT ILLNESS   Translator Used : Yes phone interpreter    Chief Complaint: Leg Pain, Back Pain, and Fever         21 y.o. female with past medical history as below c/o fever, bodyaches and muscle aches starting today. No HA, neck pain or rash.  No CP/SOB, abdominal pain, N/V/D, or urinary symptoms.  Denies cough.  Has taken nothing for her fever.  She is compliant with her seizure medications.    Independent Historian: Parent   Additional History Provided:  MEDICAL HISTORY     Past Medical History:  Past Medical History:   Diagnosis Date    Convulsions     Depression        Past Surgical History:  Past Surgical History:   Procedure Laterality Date    APPENDECTOMY (OPEN)         Social History:  Social History     Socioeconomic History    Marital status: Single   Tobacco Use    Smoking status: Never    Smokeless tobacco: Never   Vaping Use    Vaping Use: Never used   Substance and Sexual Activity    Alcohol use: Never    Drug use: Never       Family History:  History reviewed. No pertinent family history.    Outpatient Medication:  Previous Medications    ARIPIPRAZOLE (ABILIFY) 2 MG TABLET    Take 1 tablet (2 mg) by mouth daily    LAMOTRIGINE (LAMICTAL) 100 MG TABLET    Take 1 tablet (100 mg) by mouth 2 (two) times daily    MULTIPLE VITAMIN (MULTIVITAMIN) CAPSULE    Take 1 capsule by mouth daily    VITAMIN B-12 (CYANOCOBALAMIN) 1000 MCG TABLET    Take 1,000 mcg by mouth daily         REVIEW OF SYSTEMS   Review of Systems   All other systems reviewed and are negative.   See History of Present Illness  PHYSICAL EXAM     ED Triage Vitals [07/13/21 1531]   Enc Vitals Group      BP 158/90      Heart Rate 76      Resp Rate 16       Temp 98 F (36.7 C)      Temp src       SpO2 99 %      Weight 85.3 kg      Height       Head Circumference       Peak Flow       Pain Score 8      Pain Loc       Pain Edu?       Excl. in GC?      Physical Exam  Vitals and nursing note reviewed.   Constitutional:       General: She is not in acute distress.     Appearance: Normal appearance. She is not ill-appearing.   HENT:      Head: Normocephalic  and atraumatic.      Nose: Nose normal. No rhinorrhea.      Mouth/Throat:      Mouth: Mucous membranes are moist.      Pharynx: Oropharynx is clear.   Eyes:      Extraocular Movements: Extraocular movements intact.      Conjunctiva/sclera: Conjunctivae normal.      Pupils: Pupils are equal, round, and reactive to light.   Cardiovascular:      Rate and Rhythm: Normal rate and regular rhythm.      Pulses: Normal pulses.   Pulmonary:      Effort: Pulmonary effort is normal. No respiratory distress.      Breath sounds: Normal breath sounds.   Abdominal:      General: There is no distension.      Palpations: Abdomen is soft.      Tenderness: There is no abdominal tenderness. There is no guarding or rebound.   Musculoskeletal:         General: No swelling. Normal range of motion.      Cervical back: Normal range of motion and neck supple. No rigidity.   Skin:     General: Skin is warm and dry.      Capillary Refill: Capillary refill takes less than 2 seconds.      Findings: No rash.   Neurological:      General: No focal deficit present.      Mental Status: She is alert and oriented to person, place, and time.      Cranial Nerves: No cranial nerve deficit.      Motor: No weakness.      Coordination: Coordination normal.      Gait: Gait normal.   Psychiatric:         Mood and Affect: Mood normal.         Behavior: Behavior normal.          MEDICAL DECISION MAKING     PRIMARY PROBLEM LIST      Acute illness/injury with risk to life or bodily function (based on differential diagnosis or evaluation)   Chronic Illness Impacting  Care of the above problem: Chronic Psychiatric Condition and Other (explain) Increases complexity of evaluation, Increases the risk of severe disease, and Increase the risk of disease progression  FLU/COVID, UTI, PNA, viral illness  DISCUSSION      HDS, febrile o/w PO tolerant and well appearing.  No respiratory distress in ED.  Normal pulmonary exam.  Remainder of evaluation unremarkable and she is non-toxic appearing.  COVID positive.  She is vaccinated.  Recommend Rx, supportive care and outpatient follow up.    D/W patient's parent ED course, treatment plan, lab/imaging results, discharge instructions and return precautions prior to discharge.  Patient's parent understands and agrees with plan of care.    D/W patient ED course, treatment plan, lab/imaging results, discharge instructions and return precautions prior to discharge.  Patient understands and agrees with plan of care.      External Records Reviewed?: N/A  Additional Notes           Vital Signs: Reviewed the patient's vital signs.   Nursing Notes: Reviewed and utilized available nursing notes.  Medical Records Reviewed: Reviewed available past medical records.  Counseling: The emergency provider has spoken with the patient and discussed today's findings, in addition to providing specific details for the plan of care.  Questions are answered and there is agreement with the plan.      RADIOLOGY  IMAGING STUDIES      No orders to display       EMERGENCY DEPT. MEDICATIONS      ED Medication Orders (From admission, onward)      Start Ordered     Status Ordering Provider    02/16/22 2133 02/16/22 2132  acetaminophen (TYLENOL) tablet 1,000 mg  Once        Route: Oral  Ordered Dose: 1,000 mg       Last MAR action: Given Lem Peary G    02/16/22 2133 02/16/22 2132  ibuprofen (ADVIL) tablet 600 mg  Once        Route: Oral  Ordered Dose: 600 mg       Last MAR action: Given Liliya Fullenwider G            LABORATORY RESULTS    Ordered and independently interpreted  AVAILABLE laboratory tests.   Results       Procedure Component Value Units Date/Time    COVID-19 (SARS-CoV-2) and Influenza A/B, NAA ([facility])- Admission [[id number]][[id number]]  (Abnormal) Collected: 02/16/22 2023    Specimen: Culturette from Nasopharyngeal Updated: 02/16/22 2106     Purpose of COVID testing Diagnostic -[facility]     SARS-CoV-2 Specimen Source Nasal Swab     SARS CoV 2 Overall Result Detected    Narrative:      o Collect and clearly label specimen type:  o PREFERRED-Upper respiratory specimen: One Nasal Swab in  Transport Media.  o Hand deliver to laboratory ASAP  Diagnostic -[facility]I              CRITICAL CARE/PROCEDURES    Procedures    DIAGNOSIS      Diagnosis:  Final diagnoses:   COVID-19 virus infection       Disposition:  ED Disposition       ED Disposition   Discharge    Condition   --    Date/Time   Mon Feb 16, 2022  9:44 PM    Comment   Derrica Legault discharge to home/self care.    Condition at disposition: Stable                 Prescriptions:  Patient's Medications   New Prescriptions    ACETAMINOPHEN (TYLENOL) 325 MG TABLET    Take 2 tablets (650 mg) by mouth every 6 (six) hours as needed for Pain    ONDANSETRON (ZOFRAN-ODT) 4 MG DISINTEGRATING TABLET    Take 1 tablet (4 mg) by mouth every 6 (six) hours as needed for Nausea   Previous Medications    ARIPIPRAZOLE (ABILIFY) 2 MG TABLET    Take 1 tablet (2 mg) by mouth daily    LAMOTRIGINE (LAMICTAL) 100 MG TABLET    Take 1 tablet (100 mg) by mouth 2 (two) times daily    MULTIPLE VITAMIN (MULTIVITAMIN) CAPSULE    Take 1 capsule by mouth daily    VITAMIN B-12 (CYANOCOBALAMIN) 1000 MCG TABLET    Take 1,000 mcg by mouth daily   Modified Medications    No medications on file   Discontinued Medications    No medications on file           This note was generated by the Epic EMR system/ Dragon speech recognition and may contain inherent errors or omissions not intended by the user. Grammatical errors, random word insertions, deletions and pronoun errors  are  occasional consequences of this technology due to software limitations. Not all errors are caught or corrected. If  there are questions or concerns about the content of this note or information contained within the body of this dictation they should be addressed directly with the author for clarification.       Windell Hummingbird, DO  02/16/22 2150

## 2022-03-26 ENCOUNTER — Ambulatory Visit: Payer: 59 | Admitting: Student in an Organized Health Care Education/Training Program

## 2022-03-26 ENCOUNTER — Encounter: Payer: Self-pay | Admitting: Student in an Organized Health Care Education/Training Program

## 2022-03-26 ENCOUNTER — Ambulatory Visit
Payer: 59 | Attending: Student in an Organized Health Care Education/Training Program | Admitting: Student in an Organized Health Care Education/Training Program

## 2022-03-26 VITALS — BP 103/66 | HR 69 | Wt 150.0 lb

## 2022-03-26 DIAGNOSIS — R454 Irritability and anger: Secondary | ICD-10-CM | POA: Insufficient documentation

## 2022-03-26 DIAGNOSIS — G40909 Epilepsy, unspecified, not intractable, without status epilepticus: Secondary | ICD-10-CM | POA: Insufficient documentation

## 2022-03-26 DIAGNOSIS — F419 Anxiety disorder, unspecified: Secondary | ICD-10-CM | POA: Insufficient documentation

## 2022-03-26 DIAGNOSIS — F32A Depression, unspecified: Secondary | ICD-10-CM | POA: Insufficient documentation

## 2022-03-26 MED ORDER — MULTIVITAMINS PO CAPS: 1.0000 | ORAL_CAPSULE | Freq: Every day | ORAL | 3 refills | Status: AC

## 2022-03-26 NOTE — Progress Notes (Signed)
[facility] - EPILEPSY FOLLOW UP VISIT     Patient Name: Dominique Parker  MRN: [id number]  Primary Care MD: Milagros Reap, MD Phone: [phone]    Date: 03/26/2022     Diagnosis: Epilepsy  Chief Complaint   Patient presents with    Follow-up       Current AEDs: LTG 100mg  BID     Prior AEDs: LTG - on since 07/2020 (she has been tried on other medications but cannot remember names)      Seizure History: Dominique Parker is a 21 y.o. female with PMH developmental delay, anxiety/depression, and epilepsy who began having seizures at age 1 (tonic clonic initially and 5-6 lifetime absence type seizures). She was tried on different medications, but mom cannot recall the names. She was eventually started on Lamotrigine in 07/2020 after her last GTC seizure which was after relocating to the [place]. She had a absence type seizure 2 months ago but mom found out she was not being compliant with her Lamotrigine 100mg  BID at the time. They prefer that she continued on 100mg  BID at this time. She will have blood level drawn. Mom will obtain prior EEG/MRI reports and if we cannot obtain then will repeat studies.      Interval History: LOV 09/16/21    She has been having a small seizures once every 1-2 weeks where she has behavioral arrest and is not responding.     01/20/22 - most recent seizure. She did not know where she was, like memory loss. She did not know how she got down the stairs. No convulsion.  Per mom, she was looking at her and she had the phone and went out of the home, luckily they got her back inside. It was for 5 minutes she is not aware and cannot speak. Her eyes do get red. Not moving her hands and feet.    Still on Lamotrigine 100mg  BID. She states no missed dosages, only today when coming here she did not take it because she did not have breakfast yet. BUT mom states she controls the lamotrigine administration and the bottle is often very full so it is likely she does not take it al the time.     She does not  want to increase the dosage. She knows that when she gets very angry and loses control and then she has the seizure. So that is why she does not want to increase since she knows the cause. It starts with blurry vision and after 2-3 hours she has the seizure.    The last time she left home and went out of home something happens to her left hand, she has severe pain there and feels like needles. Sometimes the pain is so severe her hand gets paralyzed.    She really does not want to increase the dosage. She is saying there is some other stuff that triggers her to get angry.    LTG level 09/30/21 - 4.5    She is on Abilify 2mg . Seems to not have a good relationship with her psychiatrist.    Seizure Type:  GTC  Desc: LOC, TC, +tongue bite, incontinence, foaming  Duration: first one 7-10 minutes, later ones 2 minutes or less  Frequency: first at age 30; thereafter sometimes in 1 month, she could have 2-4 seizures, other months she may not have any. Last was 07/2020.     Seizure Type: absence vs focal impaired awareness  Desc: staring, not responding  Duration: 1-2  minutes  Frequency: 01/20/22, 1 every 1-2 weeks; 5-6 time total    Medications:  Current Outpatient Medications on File Prior to Visit   Medication Sig Dispense Refill    acetaminophen (TYLENOL) 325 MG tablet Take 2 tablets (650 mg) by mouth every 6 (six) hours as needed for Pain 20 tablet 0    ARIPiprazole (ABILIFY) 2 MG tablet Take 1 tablet (2 mg) by mouth daily      lamoTRIgine (LaMICtal) 100 MG tablet Take 1 tablet (100 mg) by mouth 2 (two) times daily 180 tablet 3    Multiple Vitamin (multivitamin) capsule Take 1 capsule by mouth daily 90 capsule 3    ondansetron (ZOFRAN-ODT) 4 MG disintegrating tablet Take 1 tablet (4 mg) by mouth every 6 (six) hours as needed for Nausea 8 tablet 0    vitamin B-12 (CYANOCOBALAMIN) 1000 MCG tablet Take 1,000 mcg by mouth daily       No current facility-administered medications on file prior to visit.       Past Medical History:    Diagnosis Date    Convulsions     Depression        History reviewed. No pertinent family history.    Past Surgical History:   Procedure Laterality Date    APPENDECTOMY (OPEN)         Social History     Tobacco Use    Smoking status: Never    Smokeless tobacco: Never   Vaping Use    Vaping Use: Never used   Substance Use Topics    Alcohol use: Never    Drug use: Never       Other Social History:  Housing: lives with mom and dad, son, daughter in law  Driving: no  Education Level: 10th grade  Employment: not working    Allergies: No Known Allergies    REVIEW OF SYSTEMS:  Constitutional: Negative for fever.   Respiratory: Negative for shortness of breath.    Cardiovascular: Negative for chest pain.   Gastrointestinal: Negative for abdominal pain.   Neurological: Negative for visual changes, speech or language problems, facial droop, focal weakness, focal numbness, limb incoordination, gait ataxia, dysphagia, or dizziness.   Psychiatric/Behavioral: Negative for mood disturbance.   All other systems reviewed and are negative except pertinent positives as noted above in HPI.     PHYSICAL EXAM:  There were no vitals taken for this visit.  Gen:  Well-developed, well-nourished.  No acute distress.  Cooperative with exam.  HEENT:  Normocephalic, atraumatic. Trachea midline  Neck: Normal range of motion.   CV:  RRR.  S1/S2.    Lungs:  Normal effort.  Extrem:  No edema.  Skin:  No obvious rashes or lesions.   Psych: Mood and affect are appropriate.    Neuro Exam:  MENTAL STATUS:  Awake, alert, oriented.  Follows commands.  Speech is fluent, non dysarthric.  CRANIAL NERVES:    CN III, IV, VI - PERRL.  EOMI.  No nystagmus.   CN V - Intact facial sensation to light touch.  CN VII - Facial movements symmetric.   CN VIII - Hearing intact to conversational speech.   CN XII - Tongue protrusion midline and without fasciculations.   MOTOR:  Normal bulk and tone.  No pronator drift.  Strength is full (5/5) and symmetric  SENSATION:   Intact to light touch in upper and lower extremities bilaterally.   REFLEXES: 2+ throughout   COORDINATION: Finger nose finger without dysmetria.  GAIT: Normal  gait      LAB & TEST RESULTS:    LTG level 09/30/21 - 4.5    Results for orders placed or performed during the hospital encounter of 02/16/22   COVID-19 (SARS-CoV-2) and Influenza A/B, NAA ([facility])- Admission    Specimen: Nasopharyngeal; Culturette   Result Value Ref Range    Purpose of COVID testing Diagnostic -PUI     SARS-CoV-2 Specimen Source Nasal Swab     SARS CoV 2 Overall Result Detected (A)     Influenza A Not Detected     Influenza B Not Detected        IMAGES:   Independently reviewed:    No results found for this or any previous visit.      ASSESSMENT AND PLAN:  Dominique Parker is a 21 y.o. female  with PMH developmental delay, anxiety/depression, and epilepsy who began having seizures at age 21 (tonic clonic initially and 5-6 lifetime absence type seizures). She was tried on different medications, but mom cannot recall the names. She was eventually started on Lamotrigine in 07/2020 after her last GTC seizure which was after relocating to the [place]. She had a absence type seizure 2 months ago but mom found out she was not being compliant with her Lamotrigine 100mg  BID at the time. They prefer that she continued on 100mg  BID at this time.   LTG level 09/30/21 - 4.5    She has a small focal impaired awareness seizure once every 1-2 weeks per mom. Per mom, she feels she likely does not take her medication consistently because the amount of pills in the bottle does not seem to change much. She is extremely hesitant to increase her medication in general because she feels her seizures are triggered by anger for the most part and is insisting on not increasing. Her blood level back in April was 4.5 on 100mg  BID. We discussed that if she was not reliably taking the medication, then she can continue on 100mg  BID for now. After taking it consistently for a  month, we can reassess and if she is still having seizures, then increase to 150mg  BID.       No diagnosis found.    - Continue Lamotrigine 100mg  twice daily    - Can try to see psychiatrist Dr. Unknown FoleyAnjuli Jindal: [phone]: [phone]      I have spent 40 minutes reviewing the record, interviewing and examining the patient, and documenting the encounter.     Dominique RuderSonia K. Rockie Vawter, MD  [facility]  Board Certified in Adult Neurology, Epilepsy, and Clinical Neurophysiology by the ABPN

## 2022-03-26 NOTE — Progress Notes (Signed)
[facility] - EPILEPSY FOLLOW UP VISIT     Patient Name: Dominique Parker  MRN: [id number]  Primary Care MD: Milagros Reap, MD Phone: [phone]    Date: 03/26/2022     Diagnosis: Epilepsy  Chief Complaint   Patient presents with    Follow-up       Current AEDs: LTG 100mg  BID (level 09/30/21 - 4.5)     Prior AEDs: LTG - on since 07/2020 (she has been tried on other medications but cannot remember names)      Seizure History: Dominique Parker is a 21 y.o. female with PMH developmental delay, anxiety/depression, and epilepsy who began having seizures at age 36 (tonic clonic initially and 5-6 lifetime absence type seizures). She was tried on different medications, but mom cannot recall the names. She was eventually started on Lamotrigine in 07/2020 after her last GTC seizure which was after relocating to the [place]. She had a absence type seizure 2 months ago but mom found out she was not being compliant with her Lamotrigine 100mg  BID at the time. They prefer that she continued on 100mg  BID at this time. She will have blood level drawn. Mom will obtain prior EEG/MRI reports and if we cannot obtain then will repeat studies.    Interval History: LOV 01/22/22    No seizures since 01/20/22.  Her mom is controlling her medications so no seizures.   She lost 3 pounds since she has been exercising. She has been doing at work at home, cleaning and helping around at home. Before she was just sleeping all the time.   No new medical problems.  She did go see Dr. Thomasene Lot who was very nice but she was shouting in the office during the visit. They will see her again.    Seizure Type:  GTC  Desc: LOC, TC, +tongue bite, incontinence, foaming  Duration: first one 7-10 minutes, later ones 2 minutes or less  Frequency: first at age 41; thereafter sometimes in 1 month, she could have 2-4 seizures, other months she may not have any. Last was 07/2020.     Seizure Type: absence vs focal impaired awareness  Desc: staring, not responding  Duration:  1-2 minutes  Frequency: 01/20/22, 1 every 1-2 weeks; 5-6 time total    Medications:  Current Outpatient Medications on File Prior to Visit   Medication Sig Dispense Refill    acetaminophen (TYLENOL) 325 MG tablet Take 2 tablets (650 mg) by mouth every 6 (six) hours as needed for Pain 20 tablet 0    ARIPiprazole (ABILIFY) 2 MG tablet Take 1 tablet (2 mg) by mouth daily      lamoTRIgine (LaMICtal) 100 MG tablet Take 1 tablet (100 mg) by mouth 2 (two) times daily 180 tablet 3    ondansetron (ZOFRAN-ODT) 4 MG disintegrating tablet Take 1 tablet (4 mg) by mouth every 6 (six) hours as needed for Nausea 8 tablet 0    vitamin B-12 (CYANOCOBALAMIN) 1000 MCG tablet Take 1,000 mcg by mouth daily      [DISCONTINUED] Multiple Vitamin (multivitamin) capsule Take 1 capsule by mouth daily 90 capsule 3     No current facility-administered medications on file prior to visit.       Past Medical History:   Diagnosis Date    Convulsions     Depression        History reviewed. No pertinent family history.    Past Surgical History:   Procedure Laterality Date    APPENDECTOMY (OPEN)  Social History     Tobacco Use    Smoking status: Never    Smokeless tobacco: Never   Vaping Use    Vaping Use: Never used   Substance Use Topics    Alcohol use: Never    Drug use: Never       Other Social History:  Housing: lives with mom and dad, son, daughter in law  Driving: no  Education Level: 10th grade  Employment: not working    Allergies: No Known Allergies    REVIEW OF SYSTEMS:  Constitutional: Negative for fever.   Respiratory: Negative for shortness of breath.    Cardiovascular: Negative for chest pain.   Gastrointestinal: Negative for abdominal pain.   Neurological: Negative for visual changes, speech or language problems, facial droop, focal weakness, focal numbness, limb incoordination, gait ataxia, dysphagia, or dizziness.   Psychiatric/Behavioral: Negative for mood disturbance.   All other systems reviewed and are negative except  pertinent positives as noted above in HPI.     PHYSICAL EXAM:  BP 103/66   Pulse 69   Wt 68 kg (150 lb)   BMI 24.21 kg/m   Gen:  Well-developed, well-nourished.  No acute distress.  Cooperative with exam.  HEENT:  Normocephalic, atraumatic. Trachea midline  Neck: Normal range of motion.   Lungs:  Normal effort.  Extrem:  No edema.  Skin:  No obvious rashes or lesions.   Psych: Flat affect    Neuro Exam:  MENTAL STATUS:  Awake, alert, oriented.  Follows commands.  Speech is fluent, non dysarthric.  CRANIAL NERVES:    CN III, IV, VI - PERRL.  EOMI.    CN V - Intact facial sensation to light touch.  CN VII - Facial movements symmetric.   CN VIII - Hearing intact to conversational speech.   MOTOR:  Normal bulk and tone.  No pronator drift.  Strength is full (5/5) and symmetric  SENSATION:  Intact to light touch in upper and lower extremities bilaterally.   REFLEXES: 2+ throughout   COORDINATION: Finger nose finger without dysmetria.  GAIT: Normal gait      LAB & TEST RESULTS:    LTG level 09/30/21 - 4.5    Results for orders placed or performed during the hospital encounter of 02/16/22   COVID-19 (SARS-CoV-2) and Influenza A/B, NAA ([facility])- Admission    Specimen: Nasopharyngeal; Culturette   Result Value Ref Range    Purpose of COVID testing Diagnostic -PUI     SARS-CoV-2 Specimen Source Nasal Swab     SARS CoV 2 Overall Result Detected (A)     Influenza A Not Detected     Influenza B Not Detected        IMAGES:   Independently reviewed:    No results found for this or any previous visit.      ASSESSMENT AND PLAN:  Dominique Parker is a 21 y.o. female  with PMH developmental delay, anxiety/depression, and epilepsy who began having seizures at age 48 (tonic clonic initially and 5-6 lifetime absence type seizures). She was tried on different medications, but mom cannot recall the names. She was eventually started on Lamotrigine in 07/2020 after her last GTC seizure which was after relocating to the [place]. She had a  absence type seizure 2 months ago but mom found out she was not being compliant with her Lamotrigine 100mg  BID at the time. They prefer that she continued on 100mg  BID at this time.   LTG level 09/30/21 - 4.5  She was having a small focal impaired awareness seizure once every 1-2 weeks per mom. Per mom, she feels she likely does not take her medication consistently because the amount of pills in the bottle does not seem to change much. She is extremely hesitant to increase her medication in general because she feels her seizures are triggered by anger for the most part and is insisting on not increasing. Her blood level back in April was 4.5 on 100mg  BID. We discussed that if she was not reliably taking the medication, then she can continue on 100mg  BID for now. After taking it consistently for a month, we can reassess and if she is still having seizures, then increase to 150mg  BID.     No seizures since 01/20/22 since her mom is administering her medications now. She is tolerating Lamotrigine 100mg  BID well. She did see new psychiatrist Dr. Thomasene Lot and will be following up with her.      1. Nonintractable epilepsy without status epilepticus, unspecified epilepsy type        2. Irritability        3. Anxiety and depression            - Continue Lamotrigine 100mg  twice daily    - Continue to follow with psychiatrist Dr. Unknown Foley      I have spent 20 minutes reviewing the record, interviewing and examining the patient, and documenting the encounter.     Diannia Ruder, MD  [facility]  Board Certified in Adult Neurology, Epilepsy, and Clinical Neurophysiology by the ABPN

## 2022-06-11 ENCOUNTER — Ambulatory Visit: Payer: 59 | Admitting: Student in an Organized Health Care Education/Training Program

## 2022-06-17 ENCOUNTER — Ambulatory Visit
Payer: 59 | Attending: Student in an Organized Health Care Education/Training Program | Admitting: Student in an Organized Health Care Education/Training Program

## 2022-06-17 ENCOUNTER — Encounter: Payer: Self-pay | Admitting: Student in an Organized Health Care Education/Training Program

## 2022-06-17 VITALS — BP 111/74 | HR 86 | Temp 98.1°F | Ht 63.0 in | Wt 154.0 lb

## 2022-06-17 DIAGNOSIS — F919 Conduct disorder, unspecified: Secondary | ICD-10-CM

## 2022-06-17 DIAGNOSIS — F32A Depression, unspecified: Secondary | ICD-10-CM

## 2022-06-17 DIAGNOSIS — G40909 Epilepsy, unspecified, not intractable, without status epilepticus: Secondary | ICD-10-CM

## 2022-06-17 DIAGNOSIS — R454 Irritability and anger: Secondary | ICD-10-CM

## 2022-06-17 DIAGNOSIS — F419 Anxiety disorder, unspecified: Secondary | ICD-10-CM | POA: Insufficient documentation

## 2022-06-17 MED ORDER — LAMOTRIGINE 25 MG PO TABS
ORAL_TABLET | ORAL | 3 refills | Status: DC
Start: 2022-06-17 — End: 2022-08-19

## 2022-06-17 NOTE — Progress Notes (Signed)
[facility] - EPILEPSY FOLLOW UP VISIT     Patient Name: Dominique Parker  MRN: [id number]  Primary Care MD: Jama Flavors, MD Phone: [phone]    Date: 06/17/2022     Diagnosis: Epilepsy  Chief Complaint   Patient presents with    Follow-up       Current AEDs: LTG 100mg  BID (level 09/30/21 - 4.5)     Prior AEDs: LTG - on since 07/2020 (she has been tried on other medications but cannot remember names)      Seizure History: Dominique Parker is a 22 y.o. female with PMH developmental delay, anxiety/depression, and epilepsy who began having seizures at age 28 (tonic clonic initially and 5-6 lifetime absence type seizures). She was tried on different medications, but mom cannot recall the names. She was eventually started on Lamotrigine in 07/2020 after her last GTC seizure which was after relocating to the [place]. She had a absence type seizure 2 months ago but mom found out she was not being compliant with her Lamotrigine 100mg  BID at the time. They prefer that she continued on 100mg  BID at this time. She will have blood level drawn. Mom will obtain prior EEG/MRI reports and if we cannot obtain then will repeat studies.    Interval History: LOV 03/26/22  Here with mom and interpreter    2 days ago - possible seizure?   She had an attack where she's lost her vision and she had some head heaviness. Has some blurry vision. If things are too bright it makes vision worse. She maybe had some loss of consciousness.   She lost her vision first and then maybe fell to the ground.    She was under more stress.  Did not miss medication.    She had a similar attack 10 years ago. She does not have any idea where she is or what she is doing. She has memory loss.   Mom is not sure if it is seizure or psychological. Mom thinks it is connected to stress.    She does seem to continue to have smaller staring seizure as well per mom. Her eyes get red when she is staring and not blinking.    Per mom, she is not able to control her  behavior. She gets emotional/angry at very simple things.    They have an appointment on 07/03/21 with psychiatry or behavioral health.      Seizure Type:  GTC  Desc: LOC, TC, +tongue bite, incontinence, foaming  Duration: first one 7-10 minutes, later ones 2 minutes or less  Frequency: first at age 62; thereafter sometimes in 1 month, she could have 2-4 seizures, other months she may not have any. Last was 07/2020.     Seizure Type: absence vs focal impaired awareness  Desc: staring, not responding  Duration: 1-2 minutes  Frequency: 01/20/22, 1 every 1-2 weeks; 5-6 time total    Medications:  Current Outpatient Medications on File Prior to Visit   Medication Sig Dispense Refill    acetaminophen (TYLENOL) 325 MG tablet Take 2 tablets (650 mg) by mouth every 6 (six) hours as needed for Pain 20 tablet 0    ARIPiprazole (ABILIFY) 2 MG tablet Take 1 tablet (2 mg) by mouth daily      lamoTRIgine (LaMICtal) 100 MG tablet Take 1 tablet (100 mg) by mouth 2 (two) times daily 180 tablet 3    Multiple Vitamin (multivitamin) capsule Take 1 capsule by mouth daily 90 capsule 3  ondansetron (ZOFRAN-ODT) 4 MG disintegrating tablet Take 1 tablet (4 mg) by mouth every 6 (six) hours as needed for Nausea 8 tablet 0    vitamin B-12 (CYANOCOBALAMIN) 1000 MCG tablet Take 1,000 mcg by mouth daily       No current facility-administered medications on file prior to visit.       Past Medical History:   Diagnosis Date    Convulsions     Depression        History reviewed. No pertinent family history.    Past Surgical History:   Procedure Laterality Date    APPENDECTOMY (OPEN)         Social History     Tobacco Use    Smoking status: Never    Smokeless tobacco: Never   Vaping Use    Vaping Use: Never used   Substance Use Topics    Alcohol use: Never    Drug use: Never       Other Social History:  Housing: lives with mom and dad, son, daughter in law  Driving: no  Education Level: 10th grade  Employment: not working    Allergies: No Known  Allergies    REVIEW OF SYSTEMS:  Constitutional: Negative for fever.   Respiratory: Negative for shortness of breath.    Cardiovascular: Negative for chest pain.   Gastrointestinal: Negative for abdominal pain.   All other systems reviewed and are negative except pertinent positives as noted above in HPI.     PHYSICAL EXAM:  BP 111/74   Pulse 86   Temp 98.1 F (36.7 C)   Ht 1.6 m (5\' 3" )   Wt 69.9 kg (154 lb)   SpO2 96%   BMI 27.28 kg/m   Gen:  Well-developed, well-nourished.  No acute distress.  Cooperative with exam.  HEENT:  Normocephalic, atraumatic. Trachea midline  Neck: Normal range of motion.   Lungs:  Normal effort.  Extrem:  No edema.  Skin:  No obvious rashes or lesions.   Psych: Flat affect    Neuro Exam:  MENTAL STATUS:  Awake, alert, oriented.  Follows commands.  Speech is fluent, non dysarthric.  CRANIAL NERVES:    CN III, IV, VI - PERRL.  EOMI.    CN V - Intact facial sensation to light touch.  CN VII - Facial movements symmetric.   CN VIII - Hearing intact to conversational speech.   MOTOR:  No pronator drift.  Strength is full (5/5) and symmetric  SENSATION:  Intact to light touch in upper and lower extremities bilaterally.   COORDINATION: Finger to nose without dysmetria.  GAIT: Normal gait      LAB & TEST RESULTS:    LTG level 09/30/21 - 4.5    Results for orders placed or performed during the hospital encounter of 02/16/22   COVID-19 (SARS-CoV-2) and Influenza A/B, NAA ([facility])- Admission    Specimen: Nasopharyngeal; Culturette   Result Value Ref Range    Purpose of COVID testing Diagnostic -PUI     SARS-CoV-2 Specimen Source Nasal Swab     SARS CoV 2 Overall Result Detected (A)     Influenza A Not Detected     Influenza B Not Detected        IMAGES:   Independently reviewed:    No results found for this or any previous visit.      ASSESSMENT AND PLAN:  Dominique Parker is a 22 y.o. female  with PMH developmental delay, anxiety/depression, and epilepsy who began having  seizures at age 53  (tonic clonic initially and 5-6 lifetime absence type seizures). She was tried on different medications, but mom cannot recall the names. She was eventually started on Lamotrigine in 07/2020 after her last GTC seizure which was after relocating to the [place]. She had a absence type seizure 2 months ago but mom found out she was not being compliant with her Lamotrigine 100mg  BID at the time. They prefer that she continued on 100mg  BID at this time.   LTG level 09/30/21 - 4.5    She was having a small focal impaired awareness seizure once every 1-2 weeks per mom. Per mom, she feels she likely does not take her medication consistently because the amount of pills in the bottle does not seem to change much. She is extremely hesitant to increase her medication in general because she feels her seizures are triggered by anger for the most part and is insisting on not increasing. Her blood level back in April was 4.5 on 100mg  BID. We discussed that if she was not reliably taking the medication, then she can continue on 100mg  BID for now. After taking it consistently for a month, we can reassess and if she is still having seizures, then increase to 150mg  BID.     At [place] 03/26/22, she did not have any seizures since 01/20/22 since her mom started administering her medications. She is tolerating Lamotrigine 100mg  BID well. She did see new psychiatrist Dr. Thomasene Lot but was screaming during the visit so it was cut short. They need to follow back up with her.    She had a possible breakthrough seizure on 06/15/21 in the setting of stress (states she lost her vision, had head heaviness, then some loss of awareness). Per mom, she also seems to still be having occasional absence seizures. We again discussed increasing her Lamotrigine slightly to 125mg  BID and then 150mg  BID. This could also help to stabilize mood. She is hesitant to increase however. Still need to see psychiatry,      1. Nonintractable epilepsy without status epilepticus,  unspecified epilepsy type        2. Anxiety and depression        3. Irritability          - Increase Lamotrigine to 125mg  twice daily (one 100mg  tab + one 25mg  tab twice daily)    - If tolerating after 2 weeks, then increase to 150mg  twice daily (one 100mg  tab + two 25mg  tabs twice daily)    - Follow up with Psychiatry as scheduled for further recommendations regarding behavioral treatment    - Can also see psychiatrist Dr. Unknown Foley again: [phone]      I have spent 30 minutes reviewing the record, interviewing and examining the patient, and documenting the encounter.     Diannia Ruder, MD  [facility]  Board Certified in Adult Neurology, Epilepsy, and Clinical Neurophysiology by the ABPN

## 2022-06-17 NOTE — Patient Instructions (Addendum)
-   Increase Lamotrigine to 125mg  twice daily (one 100mg  tab + one 25mg  tab twice daily)    - If tolerating after 2 weeks, then increase to 150mg  twice daily (one 100mg  tab + two 25mg  tabs twice daily)    - Follow up with Psychiatry as scheduled for further recommendations regarding behavioral treatment    - Can also see psychiatrist Dr. Arlyss Gandy again: [phone]

## 2022-06-24 ENCOUNTER — Telehealth: Payer: Self-pay | Admitting: Student in an Organized Health Care Education/Training Program

## 2022-06-24 NOTE — Telephone Encounter (Signed)
Pt's mother  called because lamoTRIgine (LaMICtal) 100 MG tablet that was increased to 125 MG is no longer covered under insurance. The pharmacy asked for your office to call them.   Pt's mother stated it is very urgent.  Please call pharmacy  [facility], [address]  Phone: [phone]   Fax: [phone]

## 2022-07-13 NOTE — Telephone Encounter (Signed)
PA attempted via [facility]    Medication available without authorization

## 2022-08-11 ENCOUNTER — Encounter: Payer: Self-pay | Admitting: Student in an Organized Health Care Education/Training Program

## 2022-08-12 ENCOUNTER — Other Ambulatory Visit: Payer: Self-pay

## 2022-08-12 MED ORDER — LAMOTRIGINE 100 MG PO TABS
100.0000 mg | ORAL_TABLET | Freq: Two times a day (BID) | ORAL | 3 refills | Status: DC
Start: 2022-08-12 — End: 2022-08-19

## 2022-08-19 ENCOUNTER — Encounter: Payer: Self-pay | Admitting: Student in an Organized Health Care Education/Training Program

## 2022-08-19 ENCOUNTER — Ambulatory Visit
Payer: Medicaid Other | Attending: Student in an Organized Health Care Education/Training Program | Admitting: Student in an Organized Health Care Education/Training Program

## 2022-08-19 VITALS — BP 113/67 | HR 78 | Temp 98.1°F | Ht 63.0 in | Wt 154.0 lb

## 2022-08-19 DIAGNOSIS — F32A Depression, unspecified: Secondary | ICD-10-CM | POA: Insufficient documentation

## 2022-08-19 DIAGNOSIS — F419 Anxiety disorder, unspecified: Secondary | ICD-10-CM | POA: Insufficient documentation

## 2022-08-19 DIAGNOSIS — R454 Irritability and anger: Secondary | ICD-10-CM

## 2022-08-19 DIAGNOSIS — G40919 Epilepsy, unspecified, intractable, without status epilepticus: Secondary | ICD-10-CM

## 2022-08-19 MED ORDER — LAMOTRIGINE 100 MG PO TABS
100.0000 mg | ORAL_TABLET | Freq: Two times a day (BID) | ORAL | 3 refills | Status: DC
Start: 2022-08-19 — End: 2023-07-15

## 2022-08-19 MED ORDER — LAMOTRIGINE 25 MG PO TABS
ORAL_TABLET | ORAL | 3 refills | Status: DC
Start: 2022-08-19 — End: 2023-07-15

## 2022-08-19 NOTE — Progress Notes (Signed)
[facility] - EPILEPSY FOLLOW UP VISIT     Patient Name: Dominique Parker  MRN: [id number]  Primary Care MD: Jama Flavors, MD Phone: [phone]    Date: 08/19/2022     Diagnosis: Epilepsy  Chief Complaint   Patient presents with    Follow-up       Current AEDs: LTG '125mg'$  BID (level 09/30/21 - 4.5 on '100mg'$  BID)     Prior AEDs: LTG - on since 07/2020 (she has been tried on other medications but cannot remember names)      Seizure History: Dominique Parker is a 22 y.o. female with PMH developmental delay, anxiety/depression, and epilepsy who began having seizures at age 36 (tonic clonic initially and 5-6 lifetime absence type seizures). She was tried on different medications, but mom cannot recall the names. She was eventually started on Lamotrigine in 07/2020 after her last GTC seizure which was after relocating to the [place]. She had a absence type seizure 2 months ago but mom found out she was not being compliant with her Lamotrigine '100mg'$  BID at the time. They prefer that she continued on '100mg'$  BID at this time. She will have blood level drawn. Mom will obtain prior EEG/MRI reports and if we cannot obtain then will repeat studies.    Interval History: LOV 06/17/22  Here with mom and interpreter    Did go up to '125mg'$  BID.    2 weeks ago - She did not take her medication and had spontaneous urination. She was on the phone at the time and it happened suddenly. Mom does not remember exactly what happened and that she did not do it on purpose. She was not responding at the time per mom. She had LOA/LOC and was not speaking.    She does have some smaller episodes 1/week. Can last 3-5 minutes. She will be sitting with head tilted, eyes are bigger/red, staring, not speaking, when they get closer and call her name she is not responding. She does not recall the episode occurring later.     Not sure if she is taking the medication properly or not however.   Was supposed to see psychiatrist yesterday but it was rescheduled for  09/07/22    Mom concerned about low iron levels  Also has hyperglycemia - 115    Complaining of finger and toe pain. She has stiffness and pain in her right hand. No numbness or tingling.     No headaches.    Seizure Type:  GTC  Desc: LOC, TC, +tongue bite, incontinence, foaming  Duration: first one 7-10 minutes, later ones 2 minutes or less  Frequency: first at age 1; thereafter sometimes in 1 month, she could have 2-4 seizures, other months she may not have any. Last was 07/2020.     Seizure Type: absence vs focal impaired awareness  Desc: staring, not responding +/- urinary incontinence  Duration: 1-2 minutes  Frequency: 1/week; 01/20/22, 1 every 1-2 weeks; 5-6 time total    Medications:  Current Outpatient Medications on File Prior to Visit   Medication Sig Dispense Refill    acetaminophen (TYLENOL) 325 MG tablet Take 2 tablets (650 mg) by mouth every 6 (six) hours as needed for Pain 20 tablet 0    ARIPiprazole (ABILIFY) 2 MG tablet Take 1 tablet (2 mg) by mouth daily      Multiple Vitamin (multivitamin) capsule Take 1 capsule by mouth daily 90 capsule 3    ondansetron (ZOFRAN-ODT) 4 MG disintegrating tablet Take 1 tablet (  4 mg) by mouth every 6 (six) hours as needed for Nausea 8 tablet 0    vitamin B-12 (CYANOCOBALAMIN) 1000 MCG tablet Take 1,000 mcg by mouth daily      [DISCONTINUED] lamoTRIgine (LaMICtal) 100 MG tablet Take 1 tablet (100 mg) by mouth 2 (two) times daily 180 tablet 3    [DISCONTINUED] lamoTRIgine (LaMICtal) 25 MG tablet Take two '25mg'$  tabs ('50mg'$  total) along with '100mg'$  tablet to equal a total of '150mg'$  twice daily 360 tablet 3     No current facility-administered medications on file prior to visit.       Past Medical History:   Diagnosis Date    Convulsions     Depression        History reviewed. No pertinent family history.    Past Surgical History:   Procedure Laterality Date    APPENDECTOMY (OPEN)         Social History     Tobacco Use    Smoking status: Never    Smokeless tobacco: Never    Vaping Use    Vaping Use: Never used   Substance Use Topics    Alcohol use: Never    Drug use: Never       Other Social History:  Housing: lives with mom and dad, son, daughter in law  Driving: no  Education Level: 10th grade  Employment: not working    Allergies: No Known Allergies    PHYSICAL EXAM:  BP 113/67   Pulse 78   Temp 98.1 F (36.7 C)   Ht 1.6 m ('5\' 3"'$ )   Wt 69.9 kg (154 lb)   SpO2 98%   BMI 27.28 kg/m   Gen:  Well-developed, well-nourished.  No acute distress.  Cooperative with exam.  HEENT:  Normocephalic, atraumatic. Trachea midline  Neck: Normal range of motion.   Lungs:  Normal effort.  Extrem:  No edema.  Skin:  No obvious rashes or lesions.   Psych: Flat affect    Neuro Exam:  MENTAL STATUS:  Awake, alert, oriented.  Follows commands.  Speech is fluent, non dysarthric.  CRANIAL NERVES:    CN III, IV, VI - PERRL.  EOMI.    CN V - Intact facial sensation to light touch.  CN VII - Facial movements symmetric.   CN VIII - Hearing intact to conversational speech.   MOTOR:  No pronator drift.  Strength is full (5/5) and symmetric  SENSATION:  Intact to light touch in upper and lower extremities bilaterally.   COORDINATION: Finger to nose without dysmetria.  GAIT: Normal gait      LAB & TEST RESULTS:    LTG level 09/30/21 - 4.5    Results for orders placed or performed during the hospital encounter of 02/16/22   COVID-19 (SARS-CoV-2) and Influenza A/B, NAA ([facility])- Admission    Specimen: Nasopharyngeal; Culturette   Result Value Ref Range    Purpose of COVID testing Diagnostic -PUI     SARS-CoV-2 Specimen Source Nasal Swab     SARS CoV 2 Overall Result Detected (A)     Influenza A Not Detected     Influenza B Not Detected        IMAGES:   Independently reviewed:    No results found for this or any previous visit.      ASSESSMENT AND PLAN:  Dominique Parker is a 22 y.o. female  with PMH developmental delay, anxiety/depression, and epilepsy who began having seizures at age 66 (tonic clonic  initially  and 5-6 lifetime absence type seizures). She was tried on different medications, but mom cannot recall the names. She was eventually started on Lamotrigine in 07/2020 after her last GTC seizure which was after relocating to the [place]. She had a absence type seizure 2 months ago but mom found out she was not being compliant with her Lamotrigine '100mg'$  BID at the time. They prefer that she continued on '100mg'$  BID at this time.   LTG level 09/30/21 - 4.5    She was having a small focal impaired awareness seizure once every 1-2 weeks per mom. Per mom, she feels she likely does not take her medication consistently because the amount of pills in the bottle does not seem to change much. She is extremely hesitant to increase her medication in general because she feels her seizures are triggered by anger for the most part and is insisting on not increasing. Her blood level back in April was 4.5 on '100mg'$  BID. We discussed that if she was not reliably taking the medication, then she can continue on '100mg'$  BID for now. After taking it consistently for a month, we can reassess and if she is still having seizures, then increase to '150mg'$  BID.     At [place] 03/26/22, she did not have any seizures since 01/20/22 since her mom started administering her medications. She is tolerating Lamotrigine '100mg'$  BID well. She did see new psychiatrist Dr. Abelino Derrick but was screaming during the visit so it was cut short. They need to follow back up with her.    She had a possible breakthrough seizure on 06/15/21 in the setting of stress (states she lost her vision, had head heaviness, then some loss of awareness). Per mom, she also seems to still be having occasional absence seizures. We again discussed increasing her Lamotrigine slightly to '125mg'$  BID and then '150mg'$  BID. This could also help to stabilize mood. She is hesitant to increase however. Still need to see psychiatry.    She did increase LTG to '125mg'$  BID (but mom is still not sure if she is fully  compliant). She continues to have seizure episodes with staring, not responding +/- urinary incontinence 1/week. Mom wants to observe how she does on '125mg'$  BID longer (while she makes sure she is taking medication properly). I seizures persist thereafter, they will increase to '150mg'$  BID. At some point we will need to check Lamotrigine blood level and also consider EMU admission for further evaluation though she may not be on board with this.      1. Intractable epilepsy without status epilepticus, unspecified epilepsy type        2. Anxiety and depression        3. Irritability            - Continue Lamotrigine to '125mg'$  twice daily (one '100mg'$  tab + one '25mg'$  tab twice daily)    - If tolerating after 2 weeks, then increase to '150mg'$  twice daily (one '100mg'$  tab + two '25mg'$  tabs twice daily)    - Follow up with Psychiatry as scheduled for further recommendations regarding behavioral treatment    - Discuss low iron level and hand pain with primary care doctor      I have spent 30 minutes reviewing the record, interviewing and examining the patient, and documenting the encounter.     Resa Miner, MD  [facility]  Board Certified in Adult Neurology, Epilepsy, and Clinical Neurophysiology by the ABPN

## 2022-08-19 NOTE — Patient Instructions (Addendum)
-   Continue Lamotrigine to '125mg'$  twice daily (one '100mg'$  tab + one '25mg'$  tab twice daily)    - If tolerating after 2 weeks, then increase to '150mg'$  twice daily (one '100mg'$  tab + two '25mg'$  tabs twice daily)    - Follow up with Psychiatry as scheduled for further recommendations regarding behavioral treatment    - Discuss low iron level and hand pain with primary care doctor

## 2022-10-22 ENCOUNTER — Encounter: Payer: Self-pay | Admitting: Student in an Organized Health Care Education/Training Program

## 2022-10-22 ENCOUNTER — Ambulatory Visit
Payer: Medicaid Other | Attending: Student in an Organized Health Care Education/Training Program | Admitting: Student in an Organized Health Care Education/Training Program

## 2022-10-22 VITALS — BP 99/64 | HR 77 | Resp 13 | Ht 63.0 in | Wt 155.8 lb

## 2022-10-22 DIAGNOSIS — G40909 Epilepsy, unspecified, not intractable, without status epilepticus: Secondary | ICD-10-CM | POA: Insufficient documentation

## 2022-10-22 DIAGNOSIS — F419 Anxiety disorder, unspecified: Secondary | ICD-10-CM | POA: Insufficient documentation

## 2022-10-22 DIAGNOSIS — R454 Irritability and anger: Secondary | ICD-10-CM | POA: Insufficient documentation

## 2022-10-22 DIAGNOSIS — F32A Depression, unspecified: Secondary | ICD-10-CM | POA: Insufficient documentation

## 2022-10-22 NOTE — Progress Notes (Signed)
[facility] - EPILEPSY FOLLOW UP VISIT     Patient Name: Dominique Parker  MRN: [id number]  Primary Care MD: Milagros Reap, MD Phone: [phone]    Date: 10/22/2022     Diagnosis: Epilepsy  Chief Complaint   Patient presents with    Epilepsy     Follow up       Current AEDs: LTG 125mg  BID (level 09/30/21 - 4.5 on 100mg  BID)     Prior AEDs: LTG - on since 07/2020 (she has been tried on other medications but cannot remember names)      Seizure History: Dominique Parker is a 22 y.o. female with PMH developmental delay, anxiety/depression, and epilepsy who began having seizures at age 53 (tonic clonic initially and 5-6 lifetime absence type seizures). She was tried on different medications, but mom cannot recall the names. She was eventually started on Lamotrigine in 07/2020 after her last GTC seizure which was after relocating to the [place]. She had a absence type seizure 2 months ago but mom found out she was not being compliant with her Lamotrigine 100mg  BID at the time. They prefer that she continued on 100mg  BID at this time. She will have blood level drawn. Mom will obtain prior EEG/MRI reports and if we cannot obtain then will repeat studies.    Interval History: LOV 08/19/22  Here with mom.    Still on 125mg  BID. Did not increase to 150mg  BID. Patient refuses.    April she was doing better - 3 small sz  March was not good - 6 small sz    Dr. Thomasene Lot (psych) stopped seeing her 1 month ago - needs to find a new one.    Seizure Type:  GTC  Desc: LOC, TC, +tongue bite, incontinence, foaming  Duration: first one 7-10 minutes, later ones 2 minutes or less  Frequency: first at age 24; thereafter sometimes in 1 month, she could have 2-4 seizures, other months she may not have any. Last was 07/2020.     Seizure Type: absence vs focal impaired awareness  Desc: staring, not responding +/- urinary incontinence  Duration: 1-2 minutes  Frequency: April 2024 - 3; March 2024 - 6; prior - 1/week; 01/20/22, 1 every 1-2 weeks; 5-6 time  total    Medications:  Current Outpatient Medications on File Prior to Visit   Medication Sig Dispense Refill    acetaminophen (TYLENOL) 325 MG tablet Take 2 tablets (650 mg) by mouth every 6 (six) hours as needed for Pain 20 tablet 0    ARIPiprazole (ABILIFY) 2 MG tablet Take 1 tablet (2 mg) by mouth daily      lamoTRIgine (LaMICtal) 100 MG tablet Take 1 tablet (100 mg) by mouth 2 (two) times daily 180 tablet 3    lamoTRIgine (LaMICtal) 25 MG tablet Take two 25mg  tabs (50mg  total) along with 100mg  tablet to equal a total of 150mg  twice daily 360 tablet 3    Multiple Vitamin (multivitamin) capsule Take 1 capsule by mouth daily 90 capsule 3    ondansetron (ZOFRAN-ODT) 4 MG disintegrating tablet Take 1 tablet (4 mg) by mouth every 6 (six) hours as needed for Nausea 8 tablet 0    vitamin B-12 (CYANOCOBALAMIN) 1000 MCG tablet Take 1 tablet (1,000 mcg) by mouth daily       No current facility-administered medications on file prior to visit.       Past Medical History:   Diagnosis Date    Convulsions     Depression  History reviewed. No pertinent family history.    Past Surgical History:   Procedure Laterality Date    APPENDECTOMY (OPEN)         Social History     Tobacco Use    Smoking status: Never    Smokeless tobacco: Never   Vaping Use    Vaping status: Never Used   Substance Use Topics    Alcohol use: Never    Drug use: Never       Other Social History:  Housing: lives with mom and dad, son, daughter in law  Driving: no  Education Level: 10th grade  Employment: not working    Allergies: No Known Allergies    PHYSICAL EXAM:  BP 99/64 (BP Site: Left arm, Patient Position: Sitting, Cuff Size: Medium)   Pulse 77   Resp 13   Ht 1.6 m (5\' 3" )   Wt 70.7 kg (155 lb 12.8 oz)   LMP 09/23/2022 (Approximate)   SpO2 97%   BMI 27.60 kg/m   Gen:  Well-developed, well-nourished.  No acute distress.  Cooperative with exam.  HEENT:  Normocephalic, atraumatic. Trachea midline  Neck: Normal range of motion.   Lungs:   Normal effort.  Extrem:  No edema.  Skin:  No obvious rashes or lesions.   Psych: Flat affect    Neuro Exam:  MENTAL STATUS:  Awake, alert, oriented.  Follows commands.  Speech is fluent, non dysarthric.  CRANIAL NERVES:    CN III, IV, VI - PERRL.  EOMI.    CN V - Intact facial sensation to light touch.  CN VII - Facial movements symmetric.   CN VIII - Hearing intact to conversational speech.   MOTOR:  No pronator drift.  Strength is full (5/5) and symmetric  SENSATION:  Intact to light touch in upper and lower extremities bilaterally.   COORDINATION: Finger to nose without dysmetria.  GAIT: Normal gait      LAB & TEST RESULTS:    LTG level 09/30/21 - 4.5    Results for orders placed or performed during the hospital encounter of 02/16/22   COVID-19 (SARS-CoV-2) and Influenza A/B, NAA ([facility])- Admission    Specimen: Nasopharyngeal; Culturette   Result Value Ref Range    Purpose of COVID testing Diagnostic -PUI     SARS-CoV-2 Specimen Source Nasal Swab     SARS CoV 2 Overall Result Detected (A)     Influenza A Not Detected     Influenza B Not Detected        IMAGES:   Independently reviewed:    No results found for this or any previous visit.      ASSESSMENT AND PLAN:  Dominique Parker is a 22 y.o. female  with PMH developmental delay, anxiety/depression, and epilepsy who began having seizures at age 4 (tonic clonic initially and 5-6 lifetime absence type seizures). She was tried on different medications, but mom cannot recall the names. She was eventually started on Lamotrigine in 07/2020 after her last GTC seizure which was after relocating to the [place]. She had a absence type seizure 2 months ago but mom found out she was not being compliant with her Lamotrigine 100mg  BID at the time. They prefer that she continued on 100mg  BID at this time.   LTG level 09/30/21 - 4.5    She was having a small focal impaired awareness seizure once every 1-2 weeks per mom. Per mom, she feels she likely does not take her medication  consistently because  the amount of pills in the bottle does not seem to change much. She is extremely hesitant to increase her medication in general because she feels her seizures are triggered by anger for the most part and is insisting on not increasing. Her blood level back in April was 4.5 on 100mg  BID. We discussed that if she was not reliably taking the medication, then she can continue on 100mg  BID for now. After taking it consistently for a month, we can reassess and if she is still having seizures, then increase to 150mg  BID.     At OV 03/26/22, she did not have any seizures since 01/20/22 since her mom started administering her medications. She is tolerating Lamotrigine 100mg  BID well. She did see new psychiatrist Dr. Thomasene Lot but was screaming during the visit so it was cut short. They need to follow back up with her.    She had a possible breakthrough seizure on 06/15/21 in the setting of stress (states she lost her vision, had head heaviness, then some loss of awareness). Per mom, she also seems to still be having occasional absence seizures. We again discussed increasing her Lamotrigine slightly to 125mg  BID and then 150mg  BID. This could also help to stabilize mood. She is hesitant to increase however. Still need to see psychiatry.    She did increase LTG to 125mg  BID (but mom is still not sure if she is fully compliant). She continues to have seizure episodes with staring, not responding +/- urinary incontinence 1/week. Mom wants to observe how she does on 125mg  BID longer (while she makes sure she is taking medication properly). I seizures persist thereafter, they will increase to 150mg  BID. At some point we will need to check Lamotrigine blood level and also consider EMU admission for further evaluation though she may not be on board with this.    Still on LTG 125mg  BID. Did not increase to 150mg  BID. Patient refuses. She had 6 small seizures in March and 3 small seizures in April (staring/not  responding), so mom feels she improved slightly last month. She will continue on LTG 125mg  BID and needs to find new psychologist/psychiatrist - will try [facility].      1. Nonintractable epilepsy without status epilepticus, unspecified epilepsy type        2. Anxiety and depression        3. Irritability          - Continue Lamotrigine 125mg  twice daily    - Can always increase to 150mg  twice daily (one 100mg  tab + two 25mg  tabs twice daily) if seizures persist    - Can connect with the [facility] [phone], option #1 for outpatient behavioral health therapy or option #2 for psychiatry and medication management appointments).      Diannia Ruder, MD  [facility]  Board Certified in Adult Neurology, Epilepsy, and Clinical Neurophysiology by the ABPN

## 2022-10-22 NOTE — Patient Instructions (Addendum)
-   Continue Lamotrigine 125mg  twice daily    - Can always increase to 150mg  twice daily (one 100mg  tab + two 25mg  tabs twice daily) if seizures persist    - Can connect with the [facility] [phone], option #1 for outpatient behavioral health therapy or option #2 for psychiatry and medication management appointments).

## 2022-11-12 ENCOUNTER — Encounter: Payer: Self-pay | Admitting: Student in an Organized Health Care Education/Training Program

## 2022-12-18 ENCOUNTER — Emergency Department
Admission: EM | Admit: 2022-12-18 | Discharge: 2022-12-18 | Disposition: A | Discharge status: Home / Self Care | Payer: Medicaid Other | Attending: Emergency Medicine | Admitting: Emergency Medicine

## 2022-12-18 DIAGNOSIS — R238 Other skin changes: Secondary | ICD-10-CM | POA: Insufficient documentation

## 2022-12-18 LAB — URINE BETA HCG POCT: Urine bHCG POCT: NEGATIVE m[IU]/mL

## 2022-12-18 MED ORDER — ACETAMINOPHEN 325 MG PO TABS: 650.0000 mg | ORAL_TABLET | Freq: Four times a day (QID) | ORAL | 0 refills | Status: DC | PRN

## 2022-12-18 MED ORDER — VALACYCLOVIR HCL 500 MG PO TABS
1000.0000 mg | ORAL_TABLET | Freq: Three times a day (TID) | ORAL | 0 refills | Status: AC
Start: 2022-12-18 — End: 2022-12-25

## 2022-12-18 NOTE — ED Provider Notes (Signed)
EMERGENCY DEPARTMENT ATTENDING PHYSICIAN NOTE     I performed the substantive portion of the MDM. For the problems addressed, I personally developed, reviewed, and/or approved the plan and assessment as documented by the APP.  Patient was seen by APP, see their note for further documentation of history and exam.    BRIEF HISTORY OF PRESENT ILLNESS AND ADDITIONAL EXAM FINDINGS     Chief Complaint: Rash       History per APP    22 y.o. female, un vaccinated female, presents to the ER with a rash on her left shoulder than is now spreading to her right shoulder. It was preceded by itching followed by pain. She has a swollen left sided lymph node on the neck. No history of chicken pox.     Triage Vitals:  ED Triage Vitals [12/18/22 1052]   Enc Vitals Group      BP 114/71      Heart Rate 89      Resp Rate 17      Temp 98.7 F (37.1 C)      Temp Source Oral      SpO2 99 %      Weight 69.4 kg      Height       Head Circumference       Peak Flow       Pain Score 9      Pain Loc       Pain Edu?       Excl. in GC?         {Did provider complete a independent physical exam?:64871}    MEDICAL DECISION MAKING       {External Records Reviewed? (Optional):64872}    Vital Signs: Reviewed the patient's vital signs.   Nursing Notes: Reviewed and utilized available nursing notes.  Medical Records Reviewed: Reviewed available past medical records.    CARDIAC STUDIES    The following cardiac studies were independently interpreted by me the Emergency Medicine Physician.  For full cardiac study results please see chart. I discussed testing results with the APP.    {EKG interpretation:59684}  {Rhythm:59687}  {STEMI?:64073}  {EKG interpretation:59690}    EMERGENCY IMAGING STUDIES    The following imagine studies were independently interpreted by me (emergency medicine physician). I discussed testing results with the APP.    {Xray interpreted by ED provider? (Optional):59468} {SIDE (Optional):59475}  {RESULT:59469}  {IMPRESSION:59470}    {CT  interpreted by provider? (Optional):59471}  {HYQMVH:84696}  {IMPRESSION:59474}    RADIOLOGY IMAGING STUDIES      No orders to display       EMERGENCY DEPT. MEDICATIONS      ED Medication Orders (From admission, onward)      None            LABORATORY RESULTS    Ordered and independently interpreted AVAILABLE laboratory tests.   Results       ** No results found for the last 24 hours. **              CRITICAL CARE/PROCEDURES        DIAGNOSIS    I (ED Physician) discussed final disposition with the APP    Diagnosis:  Final diagnoses:   None       Disposition:  ED Disposition       None            Prescriptions:  Patient's Medications   New Prescriptions    No medications on file   Previous  Medications    ACETAMINOPHEN (TYLENOL) 325 MG TABLET    Take 2 tablets (650 mg) by mouth every 6 (six) hours as needed for Pain    ARIPIPRAZOLE (ABILIFY) 2 MG TABLET    Take 1 tablet (2 mg) by mouth daily    LAMOTRIGINE (LAMICTAL) 100 MG TABLET    Take 1 tablet (100 mg) by mouth 2 (two) times daily    LAMOTRIGINE (LAMICTAL) 25 MG TABLET    Take two 25mg  tabs (50mg  total) along with 100mg  tablet to equal a total of 150mg  twice daily    MULTIPLE VITAMIN (MULTIVITAMIN) CAPSULE    Take 1 capsule by mouth daily    ONDANSETRON (ZOFRAN-ODT) 4 MG DISINTEGRATING TABLET    Take 1 tablet (4 mg) by mouth every 6 (six) hours as needed for Nausea    VITAMIN B-12 (CYANOCOBALAMIN) 1000 MCG TABLET    Take 1 tablet (1,000 mcg) by mouth daily   Modified Medications    No medications on file   Discontinued Medications    No medications on file

## 2022-12-18 NOTE — ED Provider Notes (Signed)
EMERGENCY DEPARTMENT NOTE     Patient initially seen and examined at   ED PHYSICIAN ASSIGNED       Date/Time Event User Comments    12/18/22 1118 Physician Assigned Hilliard Clark, MD assigned as Attending           ED MIDLEVEL (APP) ASSIGNED       Date/Time Event User Comments    12/18/22 1059 PA/NP Provider Assigned Dominique Parker Dominique Ponder, PA assigned as Physician Assistant            HISTORY OF PRESENT ILLNESS   {Translator Used (Optional):59393}    Chief Complaint: Rash       22 y.o. female with past medical history as below originally from [place] presents to ED with a 2 week history of vesicular rash to her left shoulder preceded by pruritus and followed by pain. No systemic symptoms or sick contacts. Rash now appearing on left shoulder. Varicella vaccine administered as tow shots in 2021    Independent Historian (other than patient): No  Additional History Provided by Independent Historian:  MEDICAL HISTORY     Past Medical History:  Past Medical History:   Diagnosis Date    Convulsions     Depression        Past Surgical History:  Past Surgical History:   Procedure Laterality Date    APPENDECTOMY (OPEN)         Social History:  Social History     Socioeconomic History    Marital status: Single   Tobacco Use    Smoking status: Never    Smokeless tobacco: Never   Vaping Use    Vaping status: Never Used   Substance and Sexual Activity    Alcohol use: Never    Drug use: Never     Social Determinants of Health     Food Insecurity: No Food Insecurity (12/18/2022)    Hunger Vital Sign     Worried About Running Out of Food in the Last Year: Never true     Ran Out of Food in the Last Year: Never true   Transportation Needs: No Transportation Needs (12/18/2022)    PRAPARE - Therapist, art (Medical): No     Lack of Transportation (Non-Medical): No   Intimate Partner Violence: Not At Risk (12/18/2022)    Humiliation, Afraid, Rape, and Kick questionnaire      Fear of Current or Ex-Partner: No     Emotionally Abused: No     Physically Abused: No     Sexually Abused: No   Housing Stability: Unknown (12/18/2022)    Housing Stability Vital Sign     Unable to Pay for Housing in the Last Year: No     Unstable Housing in the Last Year: No       Family History:  History reviewed. No pertinent family history.    Outpatient Medication:  Previous Medications    ARIPIPRAZOLE (ABILIFY) 2 MG TABLET    Take 1 tablet (2 mg) by mouth daily    LAMOTRIGINE (LAMICTAL) 100 MG TABLET    Take 1 tablet (100 mg) by mouth 2 (two) times daily    LAMOTRIGINE (LAMICTAL) 25 MG TABLET    Take two 25mg  tabs (50mg  total) along with 100mg  tablet to equal a total of 150mg  twice daily    MULTIPLE VITAMIN (MULTIVITAMIN) CAPSULE    Take 1 capsule by mouth daily    ONDANSETRON (ZOFRAN-ODT) 4 MG  DISINTEGRATING TABLET    Take 1 tablet (4 mg) by mouth every 6 (six) hours as needed for Nausea    VITAMIN B-12 (CYANOCOBALAMIN) 1000 MCG TABLET    Take 1 tablet (1,000 mcg) by mouth daily         REVIEW OF SYSTEMS   Review of Systems See History of Present Illness  PHYSICAL EXAM     ED Triage Vitals [12/18/22 1052]   Enc Vitals Group      BP 114/71      Heart Rate 89      Resp Rate 17      Temp 98.7 F (37.1 C)      Temp Source Oral      SpO2 99 %      Weight 69.4 kg      Height       Head Circumference       Peak Flow       Pain Score 9      Pain Loc       Pain Edu?       Excl. in GC?      Physical Exam  Vitals and nursing note reviewed.   Constitutional:       Appearance: Normal appearance.   Cardiovascular:      Rate and Rhythm: Normal rate.   Pulmonary:      Effort: Pulmonary effort is normal.      Breath sounds: Normal air entry.   Abdominal:      General: Abdomen is flat.      Palpations: Abdomen is soft.      Tenderness: There is no abdominal tenderness.   Lymphadenopathy:      Cervical: Cervical adenopathy present.      Left cervical: Superficial cervical adenopathy present.   Skin:     General: Skin is  warm and dry.      Findings: Rash present. Rash is vesicular.          Neurological:      Mental Status: She is alert.                    MEDICAL DECISION MAKING     PRIMARY PROBLEM LIST      Acute illness/injury DIAGNOSIS: Vesicular Rash  Chronic Illness Impacting Care of the above problem: N/A N/A  Differential Diagnosis: Skin Infection: cellulitis, folliculitis, impetigo, abscess, viral exanthm, shingles     DISCUSSION      Patient with non-pruritic vesicular rash on left shoulder  Mild pain at the site of the rash  No systemic symptoms  Anterior right cervical lymph node swollen and painful  Varicella immunization in 2021  No sick contacts or recent travel  HCG negative  Discussed patient with ID Dr. Rebecca Eaton who recommended Vlatrex 1g TID for 7 days and cultures for HSV and VZV.  Discussed case with ED attending, Olin Hauser, MD, who is agreeable to the plan of care, treatment and disposition.    Discussed signs and symptoms that should prompt patient to return to ED and follow up with PCP as well as discharge instructions and how to access patient records.  Patient had no other complaints, questions or concerns.          If patient is being hospitalized is severe sepsis or septic shock suspected?: N/A      Was management discussed with a consultant?: Yes (explain) Infectious Disease  {Was the decision around the need for surgery discussed with consultant:59056::"N/A"}  External Records Reviewed?: [place] Prescription Drug Monitor Reviewed, and no concerning prescriptions noted.    Additional Notes    {Diagnostic test considered and not performed:59031::"N/A"}  {Prescription medications considered and not given:59033::"N/A"}  {Hospitalization considered but not done:59032::"N/A"}  {Social Determinants of Health Considerations:59036::"N/A"}  {Was there decision to not resuscitate or to de-escalate care due to poor prognosis?:59057}         Vital Signs: Reviewed the patient's vital signs.   Nursing  Notes: Reviewed and utilized available nursing notes.  Medical Records Reviewed: Reviewed available past medical records.  Counseling: The emergency provider has spoken with the patient and discussed today's findings, in addition to providing specific details for the plan of care.  Questions are answered and there is agreement with the plan.      MIPS DOCUMENTATION    {ORAL ANTIBIOTICS given for otitis externa due to:59079}  {ACUTE BRONCHITIS Medical reasoning for giving this patient oral antibiotics for acute bronchitis are the following (Optional):59080}  {Medical reasoning for giving this patient oral antibiotics for acute sinusitis are the following (Optional):59081}  {HEAD TRAUMA Indications for head CT due to trauma (Optional):59082}    CARDIAC STUDIES    The following cardiac studies were independently interpreted by me the Emergency Medicine Provider.  For full cardiac study results please see chart.    {Monitor Strip Interpretation:59688}  {ZOXW:96045}  {Rhythm:59687}  {ST segments:59689}    {EKG interpretation:59684}  {Comparison:59859}  {Time:64077}  {Rate:59685}  {Rhythm:59687}  {ST segments:59689}  {STEMI?:64073}  {EKG interpretation:59690}    {EKG interpretation:59684}  {Comparison:59859}  {Time:64077}  {Rate:59685}  {Rhythm:59687}  {ST segments:59689}  {STEMI?:64073}  {EKG interpretation:59690}  EMERGENCY IMAGING STUDIES    The following imagine studies were independently interpreted by me (emergency medicine provider):    {Xray interpreted by ED provider? (Optional):59468} {SIDE (Optional):59475}  {Comparison:59859}  {RESULT:59469}  {IMPRESSION:59470}    {CT interpreted by provider? (Optional):59471}  {Comparison:59859}  {RESULT:59473}  {IMPRESSION:59474}  RADIOLOGY IMAGING STUDIES      No orders to display       EMERGENCY DEPT. MEDICATIONS      ED Medication Orders (From admission, onward)      None            LABORATORY RESULTS    Ordered and independently interpreted AVAILABLE laboratory tests.    Results       Procedure Component Value Units Date/Time    Urine Beta HCG POCT [[id number]]  (Normal) Collected: 12/18/22 1214    Specimen: Urine, Clean Catch Updated: 12/18/22 1219     Urine bHCG POCT Negative mIU/mL               CRITICAL CARE/PROCEDURES    Procedures  DIAGNOSIS      Diagnosis:  Final diagnoses:   Vesicular rash       Disposition:  ED Disposition       ED Disposition   Discharge    Condition   --    Date/Time   Fri Dec 18, 2022 12:50 PM    Comment   Dominique Parker discharge to home/self care.    Condition at disposition: Stable                 Prescriptions:  Patient's Medications   New Prescriptions    ACETAMINOPHEN (TYLENOL) 325 MG TABLET    Take 2 tablets (650 mg) by mouth every 6 (six) hours as needed for Pain    VALACYCLOVIR HCL (VALTREX) 500 MG TABLET    Take 2 tablets (1,000 mg)  by mouth 3 (three) times daily for 7 days   Previous Medications    ARIPIPRAZOLE (ABILIFY) 2 MG TABLET    Take 1 tablet (2 mg) by mouth daily    LAMOTRIGINE (LAMICTAL) 100 MG TABLET    Take 1 tablet (100 mg) by mouth 2 (two) times daily    LAMOTRIGINE (LAMICTAL) 25 MG TABLET    Take two 25mg  tabs (50mg  total) along with 100mg  tablet to equal a total of 150mg  twice daily    MULTIPLE VITAMIN (MULTIVITAMIN) CAPSULE    Take 1 capsule by mouth daily    ONDANSETRON (ZOFRAN-ODT) 4 MG DISINTEGRATING TABLET    Take 1 tablet (4 mg) by mouth every 6 (six) hours as needed for Nausea    VITAMIN B-12 (CYANOCOBALAMIN) 1000 MCG TABLET    Take 1 tablet (1,000 mcg) by mouth daily   Modified Medications    No medications on file   Discontinued Medications    ACETAMINOPHEN (TYLENOL) 325 MG TABLET    Take 2 tablets (650 mg) by mouth every 6 (six) hours as needed for Pain           This note was generated by the Epic EMR system/ Dragon speech recognition and may contain inherent errors or omissions not intended by the user. Grammatical errors, random word insertions, deletions and pronoun errors  are occasional consequences of this  technology due to software limitations. Not all errors are caught or corrected. If there are questions or concerns about the content of this note or information contained within the body of this dictation they should be addressed directly with the author for clarification.    {THIS REVIEW SECTION WILL AUTODELETE ONCE NOTE IS SIGNED    END REVIEW SECTION(Optional):55325}

## 2022-12-18 NOTE — ED Notes (Signed)
Lab contacted for specimen collection information regarding the herpes and varicella swabs. Lab instructed this RN to send specimens using the respiratory panel (covid swab) for both specimens . This RN send two separate mediums

## 2022-12-21 LAB — VARICELLA ZOSTER VIRUS (VZV) DNA, QUALITATIVE REAL-TIME PCR: Varicella Zoster DNA, Qualitative RT-PCR: DETECTED — AB

## 2022-12-22 LAB — HERPES SIMPLEX VIRUS (HSV) CULTURE

## 2023-01-06 ENCOUNTER — Encounter: Payer: Self-pay | Admitting: Student in an Organized Health Care Education/Training Program

## 2023-01-08 ENCOUNTER — Telehealth: Payer: Self-pay | Admitting: Student in an Organized Health Care Education/Training Program

## 2023-01-08 NOTE — Telephone Encounter (Signed)
 neurology pt and mother called about letter needed per [employer]. Told them I would send a message to the provider to respond to the message via mychart. No further questions

## 2023-01-11 ENCOUNTER — Encounter: Payer: Self-pay | Admitting: Student in an Organized Health Care Education/Training Program

## 2023-03-03 ENCOUNTER — Ambulatory Visit: Payer: Medicaid Other | Admitting: Student in an Organized Health Care Education/Training Program

## 2023-07-15 ENCOUNTER — Other Ambulatory Visit: Payer: Self-pay | Admitting: Student in an Organized Health Care Education/Training Program

## 2023-07-16 ENCOUNTER — Other Ambulatory Visit: Payer: Self-pay | Admitting: Student in an Organized Health Care Education/Training Program

## 2023-07-16 MED ORDER — LAMOTRIGINE 25 MG PO TABS
ORAL_TABLET | ORAL | 3 refills | Status: DC
Start: 2023-07-16 — End: 2023-07-19

## 2023-07-16 MED ORDER — LAMOTRIGINE 100 MG PO TABS
100.0000 mg | ORAL_TABLET | Freq: Two times a day (BID) | ORAL | 3 refills | Status: DC
Start: 2023-07-16 — End: 2024-07-15

## 2023-07-16 NOTE — Telephone Encounter (Addendum)
 Copied from CRM #[id number]. Topic: Clinical Support - Prescription Refill  >> Jul 16, 2023 10:09 AM Haylee G wrote:  Patient mother contacted the call center requesting medication refill for (lamoTRIgine  (LaMICtal ) 25 MG tablet  lamoTRIgine  (LaMICtal ) 100 MG tablet).     Patient mother is requesting a call back to further discuss or for the medication refill to be forwarded to their pharmacy.    Per Pt, Pharmacy information in medical record is up to date.     [address] [facility] - [place], [place] - [address]    Patient mother was  informed that medication refill requests should be called in up to one week prior to running out of medication.    Can someone please follow-up. Thank you

## 2023-07-19 MED ORDER — LAMOTRIGINE 100 MG PO TABS
100.0000 mg | ORAL_TABLET | Freq: Two times a day (BID) | ORAL | 3 refills | Status: AC
Start: 2023-07-19 — End: 2024-07-18

## 2023-07-19 MED ORDER — LAMOTRIGINE 25 MG PO TABS
ORAL_TABLET | ORAL | 3 refills | Status: AC
Start: 2023-07-19 — End: ?

## 2023-07-19 NOTE — Addendum Note (Signed)
 Addended by: Francella Solian on: 07/19/2023 09:23 AM     Modules accepted: Orders

## 2023-08-27 ENCOUNTER — Emergency Department
Admission: EM | Admit: 2023-08-27 | Discharge: 2023-08-27 | Disposition: A | Discharge status: Home / Self Care | Attending: Emergency Medicine | Admitting: Emergency Medicine

## 2023-08-27 DIAGNOSIS — Z1152 Encounter for screening for COVID-19: Secondary | ICD-10-CM | POA: Insufficient documentation

## 2023-08-27 DIAGNOSIS — R079 Chest pain, unspecified: Secondary | ICD-10-CM

## 2023-08-27 DIAGNOSIS — G40909 Epilepsy, unspecified, not intractable, without status epilepticus: Secondary | ICD-10-CM | POA: Insufficient documentation

## 2023-08-27 LAB — LAB USE ONLY - LIGHT BLUE - CITRATE HOLD TUBE

## 2023-08-27 LAB — LAB USE ONLY - CBC WITH DIFFERENTIAL
Absolute Basophils: 0.03 10*3/uL (ref 0.00–0.08)
Absolute Eosinophils: 0.07 10*3/uL (ref 0.00–0.44)
Absolute Immature Granulocytes: 0.03 10*3/uL (ref 0.00–0.07)
Absolute Lymphocytes: 1.35 10*3/uL (ref 0.42–3.22)
Absolute Monocytes: 0.56 10*3/uL (ref 0.21–0.85)
Absolute Neutrophils: 4.64 10*3/uL (ref 1.10–6.33)
Absolute nRBC: 0 10*3/uL (ref <=0.00)
Basophils %: 0.4 %
Eosinophils %: 1 %
Hematocrit: 39.5 % (ref 34.7–43.7)
Hemoglobin: 13.2 g/dL (ref 11.4–14.8)
Immature Granulocytes %: 0.4 %
Lymphocytes %: 20.2 %
MCH: 29.3 pg (ref 25.1–33.5)
MCHC: 33.4 g/dL (ref 31.5–35.8)
MCV: 87.8 fL (ref 78.0–96.0)
MPV: 9.6 fL (ref 8.9–12.5)
Monocytes %: 8.4 %
Neutrophils %: 69.6 %
Platelet Count: 342 10*3/uL (ref 142–346)
Preliminary Absolute Neutrophil Count: 4.64 10*3/uL (ref 1.10–6.33)
RBC: 4.5 10*6/uL (ref 3.90–5.10)
RDW: 13 % (ref 11–15)
WBC: 6.68 10*3/uL (ref 3.10–9.50)
nRBC %: 0 /100{WBCs} (ref <=0.0)

## 2023-08-27 LAB — ECG 12-LEAD
Atrial Rate: 67 {beats}/min
IHS MUSE NARRATIVE AND IMPRESSION: NORMAL
P Axis: 68 degrees
P-R Interval: 130 ms
Q-T Interval: 368 ms
QRS Duration: 64 ms
QTC Calculation (Bezet): 388 ms
R Axis: 26 degrees
T Axis: 48 degrees
Ventricular Rate: 67 {beats}/min

## 2023-08-27 LAB — COVID-19 (SARS-COV-2) & INFLUENZA  A/B, NAA (ROCHE LIAT)
Influenza A RNA: NOT DETECTED
Influenza B RNA: NOT DETECTED
SARS-CoV-2 (COVID-19) RNA: NOT DETECTED

## 2023-08-27 LAB — BASIC METABOLIC PANEL
Anion Gap: 7 (ref 5.0–15.0)
BUN: 12 mg/dL (ref 7–21)
CO2: 24 meq/L (ref 17–29)
Calcium: 9.6 mg/dL (ref 8.5–10.5)
Chloride: 108 meq/L (ref 99–111)
Creatinine: 0.6 mg/dL (ref 0.4–1.0)
GFR: >60 mL/min/{1.73_m2} (ref >=60.0)
Glucose: 98 mg/dL (ref 70–100)
Potassium: 4 meq/L (ref 3.5–5.3)
Sodium: 139 meq/L (ref 135–145)

## 2023-08-27 LAB — WHOLE BLOOD GLUCOSE POCT: Whole Blood Glucose POCT: 91 mg/dL (ref 70–100)

## 2023-08-27 LAB — LAB USE ONLY - URINE YELLOW-RED HOLD TUBE

## 2023-08-27 LAB — LAB USE ONLY - URINE GRAY CULTURE HOLD TUBE

## 2023-08-27 LAB — LAB USE ONLY - LAVENDER - EDTA HOLD TUBE

## 2023-08-27 LAB — SERUM HCG, QUALITATIVE: hCG Qualitative: NEGATIVE

## 2023-08-27 MED ORDER — LAMOTRIGINE 150 MG PO TABS
150.0000 mg | ORAL_TABLET | Freq: Two times a day (BID) | ORAL | 0 refills | Status: AC
Start: 2023-08-27 — End: ?

## 2023-08-27 NOTE — Discharge Instructions (Addendum)
 Please follow up with your primary care doctor in 1-2 days regarding your visit to the emergency department. If you develop any worsening of symptoms please contact your physician or return to the ER for reevaluation!      Dr Marvis, your neurologist, wants you to increase your LAMICTAL  to 150mg  twice daily. Please follow up in the office with her next week.

## 2023-08-27 NOTE — ED Triage Notes (Signed)
 Pt ambulated to triage for memory loss, seizures and urinary incont. since yesterday. Pt has a history of seizures, pt followed by neurology, Dr. Bartholome Kays. Last appt. January 31 for med fill. Pt takes Lamictal .     Pt alert and oriented to name and place. Pt disoriented to DOB, time, situation. Pt requesting mother to answer questions.

## 2023-08-29 NOTE — ED Provider Notes (Signed)
 EMERGENCY DEPARTMENT NOTE     Patient initially seen and examined at   ED PHYSICIAN ASSIGNED       Date/Time Event User Comments    08/27/23 1110 Physician Assigned Sharrieff Spratlin C. Jenisha Faison C, DO assigned as Attending           ED MIDLEVEL (APP) ASSIGNED       Date/Time Event User Comments    08/27/23 1037 PA/NP Provider Assigned MISSANA, MAE L Missana, Mae LITTIE, PA assigned as Physician Assistant            HISTORY OF PRESENT Lobbyist Used : Yes video interpreter    Chief Complaint: Seizures       23 y.o. female with past medical history as below presents with seizure episodes today and yesterday. Mother describes episodes as memory loss and staring and urinary incontinence. She estimates she had three episodes yesterday and three today. She has a history of seizures and is on lamictal  125mg  BID which she has been compliant with. Has otherwise been in normal state of health. Denies falls or head injuries. Acting normally at time of evaluation.     Independent Historian (other than patient): Parent   Additional History Provided by Independent Historian:  MEDICAL HISTORY     Past Medical History:  Past Medical History:   Diagnosis Date    Convulsions (CMS/HCC)     Depression        Past Surgical History:  Past Surgical History[1]    Social History:  @SOC @    Family History:  Family History[2]    Outpatient Medication:  Discharge Medication List as of 08/27/2023  3:02 PM        CONTINUE these medications which have NOT CHANGED    Details   acetaminophen  (TYLENOL ) 325 MG tablet Take 2 tablets (650 mg) by mouth every 6 (six) hours as needed for Pain, Starting Fri 12/18/2022, E-Rx      ARIPiprazole (ABILIFY) 2 MG tablet Take 1 tablet (2 mg) by mouth daily, Historical Med      !! lamoTRIgine  (LaMICtal ) 100 MG tablet Take 1 tablet (100 mg) by mouth 2 (two) times daily, Starting Mon 07/19/2023, Until Tue 07/18/2024, E-Rx      !! lamoTRIgine  (LaMICtal ) 25 MG tablet Take two 25mg  tabs (50mg  total) along with  100mg  tablet to equal a total of 150mg  twice daily, E-Rx      Multiple Vitamin (multivitamin) capsule Take 1 capsule by mouth daily, Starting Thu 03/26/2022, Until Fri 03/26/2023, E-Rx      ondansetron  (ZOFRAN -ODT) 4 MG disintegrating tablet Take 1 tablet (4 mg) by mouth every 6 (six) hours as needed for Nausea, Starting Mon 02/16/2022, E-Rx      vitamin B-12 (CYANOCOBALAMIN) 1000 MCG tablet Take 1 tablet (1,000 mcg) by mouth daily, Historical Med       !! - Potential duplicate medications found. Please discuss with provider.            REVIEW OF SYSTEMS   Review of Systems See History of Present Illness  PHYSICAL EXAM     ED Triage Vitals   Encounter Vitals Group      BP 08/27/23 1055 111/72      Systolic BP Percentile --       Diastolic BP Percentile --       Heart Rate 08/27/23 1055 89      Resp Rate 08/27/23 1055 20      Temp 08/27/23 1055 98.6 F (37 C)  Temp src 08/27/23 1055 Oral      SpO2 08/27/23 1055 100 %      Weight --       Height --       Head Circumference --       Peak Flow --       Pain Score 08/27/23 1034 0      Pain Loc --       Pain Education --       Exclude from Growth Chart --      Physical Exam   Vital Signs and Nursing notes reviewed.    Constitutional: well appearing, well hydrated, non toxic, comfortable  HEENT: NC/AT, moist mucus membranes, oropharynx clear, nose normal, Pupils equal and reactive, EOMI  Neck: Supple, non tender, no bony or midline tenderness, full ROM, no meningeal signs  Pulmonary: CTAB, no wheezes rales or rhonchi. Normal work of breathing. No respiratory distress.  Cardiovascular: Regular Rate and Rhythm. No murmurs, rubs, or gallops.   Abdomen: Soft, non tender, non distended. No rebound, guarding, or rigidity  Extremities: no deformities, no edema, normal ROM, compartments soft, neurovascularly intact throughout  Skin: normal, no rash, lesions, wounds, edema or color change  Neuro: CN 2-12 intact grossly, no facial asymmetry, no focal weakness, AAOx3  Psych:  normal mood and affect       MEDICAL DECISION MAKING     PRIMARY PROBLEM LIST      Acute illness/injury with risk to life or bodily function (based on differential diagnosis or evaluation) DIAGNOSIS:seizure     Ddx- breakthrough seizure, seizure disorder, epilepsy, infection    DISCUSSION      23 year old with seizure disorder presents with seizure episodes today and yesterday. She is at her neuro baseline at time of evaluation. No seizure activity witnessed in ED. Labs reassuring. No indication for neuro imaging at this time. D/w dr gill, patients neurologist who wants patient to increase lamictal  to 150mg  bid and follow up in office.      If patient is being hospitalized is severe sepsis or septic shock suspected?: N/A                  External Records Reviewed?: [place]  Prescription Drug Monitor Reviewed, and no concerning prescriptions noted.    Additional Notes              ED Course as of 08/29/23 2256   Fri Aug 27, 2023   1512 Spoke to dr gill, patients neurologist, who recommends increasing lamictal  to 150mg  bid and follow up in office this week. Updated mother of patient who expresses understanding of plan for discharge. [LP]      ED Course User Index  [LP] Ascher Schroepfer, Leita BROCKS, DO         Vital Signs: Reviewed the patient's vital signs.   Nursing Notes: Reviewed and utilized available nursing notes.  Medical Records Reviewed: Reviewed available past medical records.  Counseling: The emergency provider has spoken with the patient and discussed today's findings, in addition to providing specific details for the plan of care.  Questions are answered and there is agreement with the plan.      CARDIAC STUDIES    The following cardiac studies were independently interpreted by me the Emergency Medicine Provider.  For full cardiac study results please see chart.              EKG 1 interpreted by me (ED provider)  Comparison: None available  Time Interpreted:  Rate:  60-100  Rhythm: Normal Sinus Rhythm  ST segments:  No acute changes  STEMI?: NO  EKG interpretation: Normal                      RADIOLOGY IMAGING STUDIES      No orders to display       EMERGENCY IMAGING STUDIES    The following imagine studies were independently interpreted by me (emergency medicine provider):                         EMERGENCY DEPT. MEDICATIONS      ED Medication Orders (From admission, onward)      None            LABORATORY RESULTS    Ordered and independently interpreted AVAILABLE laboratory tests.   Results       Procedure Component Value Units Date/Time    Urine Yellow-Red Hold Tube [[id number]] Collected: 08/27/23 1254    Specimen: Urine, Clean Catch Updated: 08/27/23 1500     Extra Tube Hold for add-ons.    Basic Metabolic Panel [[id number]]  (Normal) Collected: 08/27/23 1229    Specimen: Blood, Venous Updated: 08/27/23 1252     Glucose 98 mg/dL      BUN 12 mg/dL      Creatinine 0.6 mg/dL      Calcium 9.6 mg/dL      Sodium 860 mEq/L      Potassium 4.0 mEq/L      Chloride 108 mEq/L      CO2 24 mEq/L      Anion Gap 7.0     GFR >60.0 mL/min/1.73 m2     Serum Beta HCG Qualitative [[id number]]  (Normal) Collected: 08/27/23 1157    Specimen: Blood, Venous Updated: 08/27/23 1231     hCG Qualitative Negative    COVID-19 and Influenza ([place]) (symptomatic) [[id number]]  (Normal) Collected: 08/27/23 1126    Specimen: Swab from Anterior Nares Updated: 08/27/23 1159     SARS-CoV-2 (COVID-19) RNA Not Detected     Influenza A RNA Not Detected     Influenza B RNA Not Detected    Narrative:      A result of Detected indicates POSITIVE for the presence of viral RNA  A result of Not Detected indicates NEGATIVE for the presence of viral RNA    Test performed using the Roche cobas Liat SARS-CoV-2 & Influenza A/B assay. This is a multiplex real-time RT-PCR assay for the detection of SARS-CoV-2, influenza A, and influenza B virus RNA. Viral nucleic acids may persist in vivo, independent of viability. Detection of viral nucleic acid does not imply the presence of  infectious virus, or that virus nucleic acid is the cause of clinical symptoms. Negative results do not preclude SARS-CoV-2, influenza A, and/or influenza B infection and should not be used as the sole basis for diagnosis, treatment or other patient management decisions. Invalid results may be due to inhibiting substances in the specimen and recollection should occur.     CBC with Differential (Order) [[id number]] Collected: 08/27/23 1114    Specimen: Blood, Venous Updated: 08/27/23 1157    Narrative:      The following orders were created for panel order CBC with Differential (Order).  Procedure                               Abnormality  Status                     ---------                               -----------         ------                     CBC with Differential (...[[id number]]                      Final result                 Please view results for these tests on the individual orders.    CBC with Differential (Component) [[id number]] Collected: 08/27/23 1114    Specimen: Blood, Venous Updated: 08/27/23 1157     WBC 6.68 x10 3/uL      Hemoglobin 13.2 g/dL      Hematocrit 60.4 %      Platelet Count 342 x10 3/uL      MPV 9.6 fL      RBC 4.50 x10 6/uL      MCV 87.8 fL      MCH 29.3 pg      MCHC 33.4 g/dL      RDW 13 %      nRBC % 0.0 /100 WBC      Absolute nRBC 0.00 x10 3/uL      Preliminary Absolute Neutrophil Count 4.64 x10 3/uL      Neutrophils % 69.6 %      Lymphocytes % 20.2 %      Monocytes % 8.4 %      Eosinophils % 1.0 %      Basophils % 0.4 %      Immature Granulocytes % 0.4 %      Absolute Neutrophils 4.64 x10 3/uL      Absolute Lymphocytes 1.35 x10 3/uL      Absolute Monocytes 0.56 x10 3/uL      Absolute Eosinophils 0.07 x10 3/uL      Absolute Basophils 0.03 x10 3/uL      Absolute Immature Granulocytes 0.03 x10 3/uL     Whole Blood Glucose POCT [[id number]]  (Normal) Collected: 08/27/23 1122    Specimen: Blood, Capillary Updated: 08/27/23 1122     Whole Blood Glucose POCT 91 mg/dL                CRITICAL CARE/PROCEDURES    Procedures    DIAGNOSIS      Diagnosis:  Final diagnoses:   Seizure disorder (CMS/HCC)       Disposition:  ED Disposition       ED Disposition   Discharge    Condition   --    Date/Time   Fri Aug 27, 2023  3:00 PM    Comment   Jaide Pritz discharge to home/self care.    Condition at disposition: Stable                 Prescriptions:  Discharge Medication List as of 08/27/2023  3:02 PM        START taking these medications    Details   !! lamoTRIgine  (LaMICtal ) 150 MG tablet Take 1 tablet (150 mg) by mouth 2 (two) times daily, Starting Fri 08/27/2023, E-Rx       !! - Potential duplicate medications found. Please discuss with provider.  CONTINUE these medications which have NOT CHANGED    Details   acetaminophen  (TYLENOL ) 325 MG tablet Take 2 tablets (650 mg) by mouth every 6 (six) hours as needed for Pain, Starting Fri 12/18/2022, E-Rx      ARIPiprazole (ABILIFY) 2 MG tablet Take 1 tablet (2 mg) by mouth daily, Historical Med      !! lamoTRIgine  (LaMICtal ) 100 MG tablet Take 1 tablet (100 mg) by mouth 2 (two) times daily, Starting Mon 07/19/2023, Until Tue 07/18/2024, E-Rx      !! lamoTRIgine  (LaMICtal ) 25 MG tablet Take two 25mg  tabs (50mg  total) along with 100mg  tablet to equal a total of 150mg  twice daily, E-Rx      Multiple Vitamin (multivitamin) capsule Take 1 capsule by mouth daily, Starting Thu 03/26/2022, Until Fri 03/26/2023, E-Rx      ondansetron  (ZOFRAN -ODT) 4 MG disintegrating tablet Take 1 tablet (4 mg) by mouth every 6 (six) hours as needed for Nausea, Starting Mon 02/16/2022, E-Rx      vitamin B-12 (CYANOCOBALAMIN) 1000 MCG tablet Take 1 tablet (1,000 mcg) by mouth daily, Historical Med       !! - Potential duplicate medications found. Please discuss with provider.              This note was generated by the Epic EMR system/ Dragon speech recognition and may contain inherent errors or omissions not intended by the user. Grammatical errors, random word insertions,  deletions and pronoun errors  are occasional consequences of this technology due to software limitations. Not all errors are caught or corrected. If there are questions or concerns about the content of this note or information contained within the body of this dictation they should be addressed directly with the author for clarification.           [1]   Past Surgical History:  Procedure Laterality Date    APPENDECTOMY (OPEN)     [2] No family history on file.       Ronin Rehfeldt C, DO  08/29/23 2300

## 2023-09-06 ENCOUNTER — Encounter: Payer: Self-pay | Admitting: Student in an Organized Health Care Education/Training Program

## 2023-09-06 ENCOUNTER — Ambulatory Visit
Attending: Student in an Organized Health Care Education/Training Program | Admitting: Student in an Organized Health Care Education/Training Program

## 2023-09-06 VITALS — BP 126/72 | HR 74 | Temp 97.9°F | Ht 63.0 in | Wt 156.0 lb

## 2023-09-06 DIAGNOSIS — R454 Irritability and anger: Secondary | ICD-10-CM | POA: Insufficient documentation

## 2023-09-06 DIAGNOSIS — G40909 Epilepsy, unspecified, not intractable, without status epilepticus: Secondary | ICD-10-CM | POA: Insufficient documentation

## 2023-09-06 DIAGNOSIS — F32A Depression, unspecified: Secondary | ICD-10-CM | POA: Insufficient documentation

## 2023-09-06 DIAGNOSIS — F419 Anxiety disorder, unspecified: Secondary | ICD-10-CM | POA: Insufficient documentation

## 2023-09-06 MED ORDER — LAMOTRIGINE 150 MG PO TABS
150.0000 mg | ORAL_TABLET | Freq: Two times a day (BID) | ORAL | 3 refills | Status: DC
Start: 2023-09-06 — End: 2024-02-08

## 2023-09-06 NOTE — Progress Notes (Signed)
 [facility] - EPILEPSY FOLLOW UP VISIT     Patient Name: Dominique Parker  MRN: [id number]  Primary Care MD: Milagros Reap, MD Phone: [phone]    Date: 09/06/2023     Diagnosis: Epilepsy  Chief Complaint   Patient presents with    Nonintractable epilepsy without status epilepticus, unspeci       Current AEDs: LTG 150mg  BID     Prior AEDs: LTG - on since 07/2020 (she has been tried on other medications but cannot remember names), level 09/30/21 - 4.5 on 100mg  BID, 125mg  BID      Seizure History: Dominique Parker is a 23 y.o. female with PMH developmental delay, anxiety/depression, and epilepsy who began having seizures at age 57 (tonic clonic initially and 5-6 lifetime absence type seizures). She was tried on different medications, but mom cannot recall the names. She was eventually started on Lamotrigine in 07/2020 after her last GTC seizure which was after relocating to the [place]. She had a absence type seizure 2 months ago but mom found out she was not being compliant with her Lamotrigine 100mg  BID at the time. They prefer that she continued on 100mg  BID at this time. She will have blood level drawn. Mom will obtain prior EEG/MRI reports and if we cannot obtain then will repeat studies.    Interval History:   Here with mom.    For 2 weeks she was c/o about her eyes and that she was having this R eye movement/abnormal sensation.     08/26/23 - In one day she had 4 seizures. Her eyes got very red, heavy, and her face darker with the first one. She couldn't control her urine with the first attack. Second time went kitchen to get something to eat but she froze in the kitchen.   Had PCP appt the following day so they decided to wait until the next day for PCP appt.    08/27/23 - While at the PCP appt she had a seizure there and was amnestic.   PCP said to go to the ER.  She had 3 total on that day she went to there ER.     With all of them she had red eyes, froze, not responding.   Each seizure lasted maybe around 2-3  minutes.  In ER increased LTG to 150mg  BID per my advice.    Now she has been taking LTG 150mg  BID.  After the first day on higher dosage, she had a seizure while in the shower. That lasted 5 minutes. Did not fall.     Today, in the morning after the shower, she felt a sensation that seizure might come back and lost energy and felt weak.    She just feels weak and her memory loss.    She still refuses to see psychiatry.     ER VISIT NOTE 08/27/23 -   Chief Complaint: Seizures  23 y.o. female with past medical history as below presents with seizure episodes today and yesterday. Mother describes episodes as memory loss and staring and urinary incontinence. She estimates she had three episodes yesterday and three today. She has a history of seizures and is on lamictal 125mg  BID which she has been compliant with. Has otherwise been in normal state of health. Denies falls or head injuries. Acting normally at time of evaluation.   MDM:  23 year old with seizure disorder presents with seizure episodes today and yesterday. She is at her neuro baseline at time of evaluation.  No seizure activity witnessed in ED. Labs reassuring. No indication for neuro imaging at this time. D/w dr Keaira Whitehurst, patients neurologist who wants patient to increase lamictal to 150mg  bid and follow up in office.       Seizure Type:  GTC  Desc: LOC, TC, +tongue bite, incontinence, foaming  Duration: first one 7-10 minutes, later ones 2 minutes or less  Frequency: first at age 59; thereafter sometimes in 1 month, she could have 2-4 seizures, other months she may not have any. Last was 07/2020.     Seizure Type: absence vs focal impaired awareness  Desc: staring, not responding +/- urinary incontinence  Duration: 1-2 minutes  Frequency: 08/28/23, 08/27/23 x 3 ,08/26/23 x 4, April 2024 - 3; March 2024 - 6; prior - 1/week; 01/20/22, 1 every 1-2 weeks; 5-6 time total    Medications:  Current Outpatient Medications on File Prior to Visit   Medication Sig Dispense  Refill    acetaminophen (TYLENOL) 325 MG tablet Take 2 tablets (650 mg) by mouth every 6 (six) hours as needed for Pain 30 tablet 0    ARIPiprazole (ABILIFY) 2 MG tablet Take 1 tablet (2 mg) by mouth daily      Multiple Vitamin (multivitamin) capsule Take 1 capsule by mouth daily 90 capsule 3    ondansetron (ZOFRAN-ODT) 4 MG disintegrating tablet Take 1 tablet (4 mg) by mouth every 6 (six) hours as needed for Nausea 8 tablet 0    vitamin B-12 (CYANOCOBALAMIN) 1000 MCG tablet Take 1 tablet (1,000 mcg) by mouth daily      [DISCONTINUED] lamoTRIgine (LaMICtal) 150 MG tablet Take 1 tablet (150 mg) by mouth 2 (two) times daily 30 tablet 0    [DISCONTINUED] lamoTRIgine (LaMICtal) 100 MG tablet Take 1 tablet (100 mg) by mouth 2 (two) times daily 180 tablet 3    [DISCONTINUED] lamoTRIgine (LaMICtal) 25 MG tablet Take two 25mg  tabs (50mg  total) along with 100mg  tablet to equal a total of 150mg  twice daily 360 tablet 3     No current facility-administered medications on file prior to visit.       Past Medical History:   Diagnosis Date    Convulsions (CMS/HCC)     Depression        No family history on file.    Past Surgical History:   Procedure Laterality Date    APPENDECTOMY (OPEN)         Social History     Tobacco Use    Smoking status: Never    Smokeless tobacco: Never   Vaping Use    Vaping status: Never Used   Substance Use Topics    Alcohol use: Never    Drug use: Never       Other Social History:  Housing: lives with mom and dad, son, daughter in law  Driving: no  Education Level: 10th grade  Employment: not working    Allergies: No Known Allergies    PHYSICAL EXAM:  BP 126/72 (BP Site: Left arm, Patient Position: Sitting, Cuff Size: Medium) Comment: 105/69 rechecked  Pulse 74   Temp 97.9 F (36.6 C) (Oral)   Ht 1.6 m (5\' 3" )   Wt 70.8 kg (156 lb)   LMP 08/20/2023 (Approximate)   SpO2 96%   BMI 27.63 kg/m   Gen:  Well-developed, well-nourished.  No acute distress.  Cooperative with exam.  HEENT:   Normocephalic, atraumatic.  Neck: Normal range of motion.   Lungs:  Normal effort.  Psych: Flat affect  Neuro Exam:  MENTAL STATUS:  Awake, alert, oriented.  Follows commands.  Speech is fluent, non dysarthric.  CRANIAL NERVES:    CN III, IV, VI -  EOMI.    CN VII - Facial movements symmetric.   CN VIII - Hearing intact to conversational speech.   MOTOR:  No pronator drift.  Strength is full (5/5) and symmetric  SENSATION:  Intact to light touch in upper and lower extremities bilaterally.   GAIT: Normal gait      LAB & TEST RESULTS:    LTG level 09/30/21 - 4.5    Results for orders placed or performed during the hospital encounter of 08/27/23   Light Blue - Citrate Hold Tube    Collection Time: 08/27/23 11:14 AM   Result Value Ref Range    Extra Tube Hold for add-ons.    Lavender - EDTA Hold Tube    Collection Time: 08/27/23 11:14 AM   Result Value Ref Range    Extra Tube Hold for add-ons.    CBC with Differential (Component)    Collection Time: 08/27/23 11:14 AM   Result Value Ref Range    WBC 6.68 3.10 - 9.50 x10 3/uL    Hemoglobin 13.2 11.4 - 14.8 g/dL    Hematocrit 16.1 09.6 - 43.7 %    Platelet Count 342 142 - 346 x10 3/uL    MPV 9.6 8.9 - 12.5 fL    RBC 4.50 3.90 - 5.10 x10 6/uL    MCV 87.8 78.0 - 96.0 fL    MCH 29.3 25.1 - 33.5 pg    MCHC 33.4 31.5 - 35.8 g/dL    RDW 13 11 - 15 %    nRBC % 0.0 <=0.0 /100 WBC    Absolute nRBC 0.00 <=0.00 x10 3/uL    Preliminary Absolute Neutrophil Count 4.64 1.10 - 6.33 x10 3/uL    Neutrophils % 69.6 Not Established %    Lymphocytes % 20.2 Not Established %    Monocytes % 8.4 Not Established %    Eosinophils % 1.0 Not Established %    Basophils % 0.4 Not Established %    Immature Granulocytes % 0.4 Not Established %    Absolute Neutrophils 4.64 1.10 - 6.33 x10 3/uL    Absolute Lymphocytes 1.35 0.42 - 3.22 x10 3/uL    Absolute Monocytes 0.56 0.21 - 0.85 x10 3/uL    Absolute Eosinophils 0.07 0.00 - 0.44 x10 3/uL    Absolute Basophils 0.03 0.00 - 0.08 x10 3/uL    Absolute  Immature Granulocytes 0.03 0.00 - 0.07 x10 3/uL   Whole Blood Glucose POCT    Collection Time: 08/27/23 11:22 AM   Result Value Ref Range    Whole Blood Glucose POCT 91 70 - 100 mg/dL   EAVWU-98 and Influenza (Liat) (symptomatic)    Collection Time: 08/27/23 11:26 AM    Specimen: Anterior Nares; Swab   Result Value Ref Range    SARS-CoV-2 (COVID-19) RNA Not Detected Not Detected    Influenza A RNA Not Detected Not Detected    Influenza B RNA Not Detected Not Detected   ECG 12 lead    Collection Time: 08/27/23 11:47 AM   Result Value Ref Range    Ventricular Rate 67 BPM    Atrial Rate 67 BPM    P-R Interval 130 ms    QRS Duration 64 ms    Q-T Interval 368 ms    QTC Calculation (Bezet) 388 ms    P Axis  68 degrees    R Axis 26 degrees    T Axis 48 degrees    IHS MUSE NARRATIVE AND IMPRESSION       NORMAL SINUS RHYTHM  NORMAL ECG  WHEN COMPARED WITH ECG OF 21-Oct-2020 12:06,  VENTRICULAR RATE HAS DECREASED by  38 bpm  Confirmed by Maurine Cane, MD, LEONARD ([id number]) on 08/27/2023 3:53:43 PM     Serum Beta HCG Qualitative    Collection Time: 08/27/23 11:57 AM   Result Value Ref Range    hCG Qualitative Negative Negative   Basic Metabolic Panel    Collection Time: 08/27/23 12:29 PM   Result Value Ref Range    Glucose 98 70 - 100 mg/dL    BUN 12 7 - 21 mg/dL    Creatinine 0.6 0.4 - 1.0 mg/dL    Calcium 9.6 8.5 - 78.2 mg/dL    Sodium 956 213 - 086 mEq/L    Potassium 4.0 3.5 - 5.3 mEq/L    Chloride 108 99 - 111 mEq/L    CO2 24 17 - 29 mEq/L    Anion Gap 7.0 5.0 - 15.0    GFR >60.0 >=60.0 mL/min/1.73 m2   Urine Gray Culture Hold Tube    Collection Time: 08/27/23 12:54 PM   Result Value Ref Range    Extra Tube Hold for add-ons.    Urine Yellow-Red Hold Tube    Collection Time: 08/27/23 12:54 PM   Result Value Ref Range    Extra Tube Hold for add-ons.        IMAGES:   Independently reviewed:    No results found for this or any previous visit.      ASSESSMENT AND PLAN:  Dominique Parker is a 23 y.o. female presenting to me on 09/16/21 with  PMH developmental delay, anxiety/depression, and epilepsy who began having seizures at age 13 (tonic clonic initially and 5-6 lifetime absence type seizures). She was tried on different medications, but mom cannot recall the names. She was eventually started on Lamotrigine in 07/2020 after her last GTC seizure which was after relocating to the [place]. She had a absence type seizure 2 months ago but mom found out she was not being compliant with her Lamotrigine 100mg  BID at the time. They prefer that she continued on 100mg  BID at this time.     01/22/22:. She was having a small focal impaired awareness seizure once every 1-2 weeks per mom. Per mom, she feels she likely does not take her medication consistently because the amount of pills in the bottle does not seem to change much. She is extremely hesitant to increase her medication in general because she feels her seizures are triggered by anger for the most part and is insisting on not increasing.  LTG level on 09/30/21 was 4.5 on 100mg  BID. We discussed that if she was not reliably taking the medication, then she can continue on 100mg  BID for now. After taking it consistently for a month, we can reassess and if she is still having seizures, then increase to 150mg  BID.     03/26/22, she did not have any seizures since 01/20/22 since her mom started administering her medications. She is tolerating Lamotrigine 100mg  BID well. She did see new psychiatrist Dr. Thomasene Lot but was screaming during the visit so it was cut short. They need to follow back up with her.    06/17/22: She had a possible breakthrough seizure on 06/15/21 in the setting of stress (states she lost her vision, had  head heaviness, then some loss of awareness). Per mom, she also seems to still be having occasional absence seizures. We again discussed increasing her Lamotrigine slightly to 125mg  BID and then 150mg  BID. This could also help to stabilize mood. She is hesitant to increase however. Still need to see  psychiatry.    08/19/22: She did increase LTG to 125mg  BID (but mom is still not sure if she is fully compliant). She continues to have seizure episodes with staring, not responding +/- urinary incontinence 1/week. Mom wants to observe how she does on 125mg  BID longer (while she makes sure she is taking medication properly). I seizures persist thereafter, they will increase to 150mg  BID. At some point we will need to check Lamotrigine blood level and also consider EMU admission for further evaluation though she may not be on board with this.    10/22/22: Still on LTG 125mg  BID. Did not increase to 150mg  BID. Patient refuses. She had 6 small seizures in March and 3 small seizures in April (staring/not responding), so mom feels she improved slightly last month. She will continue on LTG 125mg  BID and needs to find new psychologist/psychiatrist - will try [facility].    09/06/23: For 2 weeks she was c/o eye discomfort, specifically abnormal R eye movement/sensation. On 08/26/23, she had 4 breakthrough FIA seizures (with the first one had urinary incontinence). On 08/27/23, she had 3 total seizures (one in PCP office prompting her to go to ER). With all of them she had red eyes, froze, not responding. Each seizure lasted maybe around 2-3 minutes. In ER increased LTG to 150mg  BID per my advice. After the first day on higher dosage 08/28/23, she had a seizure while in the shower. That lasted longer, 5 minutes. Did not fall, mom was there. Today, in the morning after the shower, she felt a sensation that seizure might come back; lost energy and felt weak. We discussed continuing LTG 150mg  BID vs increasing to 175mg  BID and she prefers to stay on her current dosage. We discussed that she needs to see psychiatry to manage her Abilify.      1. Nonintractable epilepsy without status epilepticus, unspecified epilepsy type (CMS/HCC)        2. Irritability        3. Anxiety and depression          - Continue Lamotrigine 150mg   twice daily     - Make appointment with Psychiatry  - Dr. Estelle Grumbles a pyshciatrist in [place] who appears to take Medicaid  Address: [address], [place], [place] [address]  Phone: [phone]    - Psychiatry needs to manage the Abilify    - You can also try to make an appointment with [facility] [phone], option #2 for psychiatry and medication management appointments).    I have spent 40 minutes on the day of service including face to face time with patient, coordinating care, record review, and documentation.     Diannia Ruder, MD  [facility]  Board Certified in Adult Neurology, Epilepsy, and Clinical Neurophysiology by the ABPN

## 2023-09-06 NOTE — Patient Instructions (Addendum)
-   Continue Lamotrigine 150mg  twice daily     - Make appointment with Psychiatry  - Dr. Estelle Grumbles a pyshciatrist in [place] who appears to take Medicaid  Address: [address] [place], [place] [address]  Phone: [phone]    - Psychiatry needs to manage the Abilify    - You can also try to make an appointment with [facility] [phone], option #2 for psychiatry and medication management appointments).

## 2023-09-07 ENCOUNTER — Encounter (HOSPITAL_BASED_OUTPATIENT_CLINIC_OR_DEPARTMENT_OTHER): Payer: Self-pay

## 2023-09-07 NOTE — Progress Notes (Signed)
 Patient contacted the [facility] requesting support with scheduling an appointment. Patient has an internal [facility] referral in Epic.    Outcome:    No appointments available and resources provided     N/A  Pt provided verbal consent for mother Genette Sloan to support with completion registration and scheduling.  Dari interpreter services needed [place] # [id number]    Reminder:  If you feel the need for urgent care, please seek emergency care immediately

## 2023-10-04 ENCOUNTER — Telehealth: Payer: Self-pay | Admitting: Student in an Organized Health Care Education/Training Program

## 2023-10-04 NOTE — Telephone Encounter (Signed)
 Nurse/writer attempted to call patient back to discuss previous message. Nurse.writer left call back number [phone]).

## 2023-10-04 NOTE — Telephone Encounter (Signed)
 Copied from CRM [id number]. Topic: Clinical Support - Speak With Nurse  >> Oct 04, 2023  9:46 AM Judithann Novas wrote:  Roxane Copp called about Clinical Support - Speak With Nurse.  Additional details: pt mom called in regards of looking to see if they can speak to the clinical team or dr. Buster Cash about the pt having seizures again and would like to know if she can discuss the concerns that the pt is facing, if possible please advise

## 2023-12-22 ENCOUNTER — Ambulatory Visit: Admitting: Student in an Organized Health Care Education/Training Program

## 2024-01-14 ENCOUNTER — Ambulatory Visit: Attending: Student in an Organized Health Care Education/Training Program

## 2024-01-14 VITALS — BP 105/66 | HR 69 | Resp 16 | Ht 63.0 in | Wt 147.2 lb

## 2024-01-14 DIAGNOSIS — F32A Depression, unspecified: Secondary | ICD-10-CM | POA: Insufficient documentation

## 2024-01-14 DIAGNOSIS — F419 Anxiety disorder, unspecified: Secondary | ICD-10-CM | POA: Insufficient documentation

## 2024-01-14 NOTE — Progress Notes (Signed)
 Chief Complaint:    Chief Complaint   Patient presents with    Seizures     2 weeks with multiple seizures     This visit was completed with the assistance of a telephonic [place] interpreter:  Lyndee [phone], [employer].    Interval History 01/14/2024  Dominique Parker is a 23 y.o. female who presents for follow up for epilepsy.  She was last seen by Dr. Marvis on 09/06/2023.   At that visit Dr. Marvis discussed increasing Lamotrigine  to 175 mg but patient's mom preferred to keep current dose.    Patient has a history of seizures that started at about age 1.  Initially she had GTC seizures.  She has also had 5-6 absence type seizures during her lifetime.      Today the patient's mother states the patient is having 3 seizures a day about 3 days per week.  They are apparently her usual seizures.  Mother states the patient is sleeping well and there is no new stress.  However, she also states that the patient does tend to have more seizures when she is stressed out.  We discussed increasing lamotrigine  as last recommended by Dr. Marvis.  Patient became angry and refused.  Her mother is asking me to help with her stress and I encouraged working with psychiatry for that.    Unfortunately they have been unable to find a psychiatrist that they like.  She was started on a medication that caused her to be very tired and have nausea and vomiting.  Patient's mother is not sure what that medication was.  She is asking for a referral to [facility].  Apparently the psychiatrist Dr. Marvis recommended never called back.  I gave her a referral and the number to schedule with [facility]    Overall the patient states she doesn't feel good.  She states her skin is pale and she feels like she might have a seizure.  Her mother actually states she had a seizure during our visit.  There was about a two minute period where patient's head was flexed and she did not respond.      ROS is negative other than complaints mentioned  above.    PHYSICAL EXAM  BP 105/66 (BP Site: Right arm, Patient Position: Sitting, Cuff Size: Medium)   Pulse 69   Resp 16   Ht 1.6 m (5' 3)   Wt 66.8 kg (147 lb 3.2 oz)   LMP 01/08/2024 (Exact Date)   SpO2 98%   BMI 26.08 kg/m     Neuro Exam:    CN II-XII grossly intact.  Conjugate gaze.  No obvious  facial asymmetry.  Hearing is intact to normal conversation.  Motor exam grossly normal.  Moves all extremities equally.  Gait normal to all modalities.    RECORDS REVIEWED:  -Last OV note by Dr. Marvis 09/07/2023    IMPRESSION:  Dominique Parker is a 23 y.o. female who presents for follow up for breakthrough seizures.     Epilepsy   Breakthrough seizures   History of medication non-compliance   Depression and anxiety    PLAN:  -Recommended increasing Lamictal , patient refuses, so continue 150 mg twice daily  - Dr. Brad Rosette a pyshciatrist in [place] who appears to take Medicaid  Address: [address], [place], [place] [address]  Phone: [phone]  --OR--  Please call [facility] at [phone] to schedule.  -Follow up with Dr. Marvis in about 3 months.  An attending physician was available in a supervisory capacity during this visit.    I spent 40 minutes in patient care.

## 2024-01-14 NOTE — Patient Instructions (Signed)
 PLAN:  -Recommended increasing Lamictal , patient refuses, so continue 150 mg twice daily  - Dr. Nina Kachyiants a pyshciatrist in [place] who appears to take Medicaid  Address: [address], [place], [place] [address]  Phone: [phone]  --OR--  Please call [facility] at [phone] to schedule.  -Follow up with Dr. Marvis in about 3 months.

## 2024-01-20 ENCOUNTER — Encounter (HOSPITAL_BASED_OUTPATIENT_CLINIC_OR_DEPARTMENT_OTHER): Payer: Self-pay

## 2024-02-08 ENCOUNTER — Encounter (INDEPENDENT_AMBULATORY_CARE_PROVIDER_SITE_OTHER): Payer: Self-pay | Admitting: Family Medicine

## 2024-02-08 ENCOUNTER — Encounter (INDEPENDENT_AMBULATORY_CARE_PROVIDER_SITE_OTHER): Payer: Self-pay

## 2024-02-08 ENCOUNTER — Ambulatory Visit (INDEPENDENT_AMBULATORY_CARE_PROVIDER_SITE_OTHER): Admitting: Family Medicine

## 2024-02-08 VITALS — BP 96/67 | HR 81 | Temp 98.9°F | Resp 17 | Ht 62.99 in | Wt 141.4 lb

## 2024-02-08 DIAGNOSIS — G40909 Epilepsy, unspecified, not intractable, without status epilepticus: Secondary | ICD-10-CM

## 2024-02-08 DIAGNOSIS — F419 Anxiety disorder, unspecified: Secondary | ICD-10-CM

## 2024-02-08 DIAGNOSIS — F32A Depression, unspecified: Secondary | ICD-10-CM

## 2024-02-08 DIAGNOSIS — R634 Abnormal weight loss: Secondary | ICD-10-CM | POA: Insufficient documentation

## 2024-02-08 DIAGNOSIS — Z139 Encounter for screening, unspecified: Secondary | ICD-10-CM

## 2024-02-08 MED ORDER — LAMOTRIGINE 150 MG PO TABS: 150.0000 mg | ORAL_TABLET | Freq: Two times a day (BID) | ORAL | 3 refills | Status: DC

## 2024-02-08 NOTE — Progress Notes (Signed)
 Language Spoken: english and dari  Interpreter ID#:  n/a, fluent in patient's spoken language    HPI:   This is a 23 y.o. female with hx of developmental delay, anxiety/depression, elipepsy who presents for:      Problem List Items Addressed This Visit       Nonintractable epilepsy without status epilepticus, unspecified epilepsy type (CMS/HCC) - Primary    Overview   Lamictal  150 mg BID, seeing Neuro, last seen 01/14/24. Deferred lamictal  increase.  - last seizure 11/2023         Relevant Medications    lamoTRIgine  (LaMICtal ) 150 MG tablet    Anxiety and depression    Overview   Previously seeing a psychiatrist, last saw in June. But has appt with new psychiatrist next week  - was on aripirazole for depression but they stopped it.    - doing overall okay, no SI         Losing weight    Overview   02/08/24 Pt has been decreasing appetite to lose weight intentionally (reports people had previously commented on her weight), mom worried that she's too skinny, reassurance provided, continue to monitor             Medical History[1]  Past Surgical History[2]  Family History[3]  Social History[4]    ALLERGIES:   Allergies[5]    MEDICATIONS:   Current Medications[6]    PHYSICAL EXAM:   BP 96/67 (BP Site: Right arm, Patient Position: Sitting, Cuff Size: Medium)   Pulse 81   Temp 98.9 F (37.2 C) (Oral)   Resp 17   Ht 1.6 m (5' 2.99)   Wt 64.1 kg (141 lb 6.4 oz)   LMP 02/04/2024 (Exact Date)   SpO2 99%   BMI 25.05 kg/m     Physical Exam    Constitutional: well-appearing, in no acute distress, sitting comfortably in the chair  HEENT: normocephalic/atraumatic. EOM grossly intact. MMM  NECK: supple  CARD: Regular rate  PULM: normal respiratory rate and effort.  GI: non-distended  MSK: moving all 4 extremities equally  SKIN: warm, dry. No visible rashes or lesions  NEURO: awake and alert. Converses and answers questions appropriately. No gross deficits.  PSYCH: pleasant affect.     DIAGNOSTICS:     Lab Results    Component Value Date    WBC 6.68 08/27/2023    HGB 13.2 08/27/2023    HCT 39.5 08/27/2023    PLT 342 08/27/2023    ALT 12 10/21/2020    AST 18 10/21/2020    NA 139 08/27/2023    K 4.0 08/27/2023    CL 108 08/27/2023    CREAT 0.6 08/27/2023    BUN 12 08/27/2023    CO2 24 08/27/2023    TSH 0.84 10/21/2020    GLU 98 08/27/2023    ALKPHOS 57 10/21/2020       No results found.    ACTIVE PROBLEMS:   Problem List[7]    ASSESSMENT and PLAN:   23 y.o. female with hx of developmental delay, anxiety/depression, elipepsy who presents for:      Dominique Parker was seen today for establish care, weight loss and seizures.    Diagnoses and all orders for this visit:    Nonintractable epilepsy without status epilepticus, unspecified epilepsy type (CMS/HCC) : chronic, stable, cont current med mgmt (see problem list/HPI for further detail)  -     lamoTRIgine  (LaMICtal ) 150 MG tablet; Take 1 tablet (150 mg) by mouth 2 (two) times daily  Losing weight: intentional as pt wanted to look skinnier, mom worried  -reassurance provided, cont to monitor    Anxiety and depression chronic, recurring. Off meds currently  - will see psych next week    - getting record release to see last bloodwork, reportedly 3 mos ago      The following SDOH drivers were screened within the last 14 days and results reviewedExercise,Financial, Transportation,Food,Stress,Social,Violence,Alcohol,Utilities,Literacy,   Depression,    .                Return/ED precautions discussed. Reviewed medications prescribed including risks/benefits. Patient and/or parent of patient verbalized agreement and understanding of plan.    Follow up: Return in about 6 weeks (around 03/21/2024) for chronic issues.     HCM: future          Gustav Miyamoto, MD  [facility]  February 08, 2024  2:05 PM           [1]   Past Medical History:  Diagnosis Date    Convulsions (CMS/HCC)     Depression    [2]   Past Surgical History:  Procedure Laterality Date    APPENDECTOMY  (OPEN)     [3]   Family History  Problem Relation Name Age of Onset    No known problems Mother      No known problems Father     [4]   Social History  Tobacco Use    Smoking status: Never    Smokeless tobacco: Never   Vaping Use    Vaping status: Never Used   Substance Use Topics    Alcohol use: Never    Drug use: Never   [5] No Known Allergies  [6]   Current Outpatient Medications:     Ferrous Sulfate (IRON PO), Take by mouth, Disp: , Rfl:     Multiple Vitamin (multivitamin) capsule, Take 1 capsule by mouth daily, Disp: 90 capsule, Rfl: 3    vitamin B-12 (CYANOCOBALAMIN) 1000 MCG tablet, Take 1 tablet (1,000 mcg) by mouth daily, Disp: , Rfl:     VITAMIN D PO, Take by mouth, Disp: , Rfl:     acetaminophen  (TYLENOL ) 325 MG tablet, Take 2 tablets (650 mg) by mouth every 6 (six) hours as needed for Pain (Patient not taking: Reported on 02/08/2024), Disp: 30 tablet, Rfl: 0    lamoTRIgine  (LaMICtal ) 150 MG tablet, Take 1 tablet (150 mg) by mouth 2 (two) times daily, Disp: 180 tablet, Rfl: 3    ondansetron  (ZOFRAN -ODT) 4 MG disintegrating tablet, Take 1 tablet (4 mg) by mouth every 6 (six) hours as needed for Nausea (Patient not taking: Reported on 02/08/2024), Disp: 8 tablet, Rfl: 0  [7]   Patient Active Problem List  Diagnosis    Nonintractable epilepsy without status epilepticus, unspecified epilepsy type (CMS/HCC)    Anxiety and depression    Irritability    Losing weight

## 2024-02-08 NOTE — Progress Notes (Signed)
 Sent fax to [facility] requesting complete medical records. Fax was successfully sent out at 2:12PM on 02/08/24.

## 2024-04-20 ENCOUNTER — Ambulatory Visit: Admitting: Student in an Organized Health Care Education/Training Program

## 2024-04-21 ENCOUNTER — Ambulatory Visit (INDEPENDENT_AMBULATORY_CARE_PROVIDER_SITE_OTHER): Admitting: Family Medicine

## 2024-04-21 ENCOUNTER — Encounter (INDEPENDENT_AMBULATORY_CARE_PROVIDER_SITE_OTHER): Payer: Self-pay | Admitting: Family Medicine

## 2024-04-21 VITALS — BP 101/61 | HR 63 | Temp 98.6°F | Resp 16 | Ht 62.8 in | Wt 132.0 lb

## 2024-04-21 DIAGNOSIS — F419 Anxiety disorder, unspecified: Secondary | ICD-10-CM

## 2024-04-21 DIAGNOSIS — R634 Abnormal weight loss: Secondary | ICD-10-CM

## 2024-04-21 DIAGNOSIS — F32A Depression, unspecified: Secondary | ICD-10-CM

## 2024-04-21 NOTE — Progress Notes (Signed)
 Language Spoken: Dari  Interpreter ID#:  interpreter used    HPI:   This is a 23 y.o. female with hx of developmental delay, anxiety/depression, elipepsy who presents for:     Problem List Items Addressed This Visit       Anxiety and depression    Overview   Previously seeing a psychiatrist, last saw in June. But has appt with new psychiatrist next week  - was on aripirazole for depression but they stopped it.    - doing overall okay, no SI    04/21/24: has been losing weight because she isn't eating at all. Doesn't have a psychiatrist  - sending to [facility] today. Pt reports having intrusive thoughts, severe nightmares, severe depression, no SI.   - losing extensive weight, so getting bloodwork         Losing weight - Primary    Overview   Wt Readings from Last 10 Encounters:   04/21/24 59.9 kg (132 lb)   02/08/24 64.1 kg (141 lb 6.4 oz)   01/14/24 66.8 kg (147 lb 3.2 oz)   09/06/23 70.8 kg (156 lb)   12/18/22 69.4 kg (153 lb)   10/22/22 70.7 kg (155 lb 12.8 oz)   08/19/22 69.9 kg (154 lb)   06/17/22 69.9 kg (154 lb)   03/26/22 68 kg (150 lb)   02/16/22 69 kg (152 lb 1.9 oz)         02/08/24 Pt has been decreasing appetite to lose weight intentionally (reports people had previously commented on her weight), mom worried that she's too skinny, reassurance provided, continue to monitor  04/21/24: pt reports decreased appetite, barely eating         Relevant Orders    Hemoglobin A1C    Lipid Panel    HIV-1/2, Antigen and Antibody with Reflex to Confirmation    Lipase    Quantiferon(R) - TB Gold Plus    Comprehensive Metabolic Panel    CBC with Differential (Order)    Thyroid  Stimulating Hormone (TSH) with Reflex to Free T4        Medical History[1]  Past Surgical History[2]  Family History[3]  Social History[4]    ALLERGIES:   Allergies[5]    MEDICATIONS:   Current Medications[6]    PHYSICAL EXAM:   BP 101/61 (BP Site: Right arm, Patient Position: Sitting, Cuff Size: Medium)   Pulse 63   Temp 98.6 F (37 C) (Oral)    Resp 16   Ht 1.595 m (5' 2.8)   Wt 59.9 kg (132 lb)   LMP 04/07/2024 (Exact Date)   SpO2 99%   BMI 23.54 kg/m     Physical Exam    Constitutional: well-appearing, in no acute distress, sitting comfortably in the chair  HEENT: normocephalic/atraumatic. EOM grossly intact. MMM  NECK: supple with normal ROM. No cervical or supraclavicular LAD.   CARD: regular rate and rhythm with normal S1 and S2. No murmurs, gallops, or rubs  PULM: normal respiratory rate and effort. Lungs CTAB. No wheezes, crackles, ronchi  GI: , non-distended.  MSK: moving all 4 extremities equally. No clubbing, cyanosis, or edema. 2+ peripheral pulses  SKIN: warm, dry. No visible rashes or lesions  NEURO: awake and alert. Converses and answers questions appropriately. No gross deficits.  PSYCH: pleasant affect.    DIAGNOSTICS:     Lab Results   Component Value Date    WBC 6.68 08/27/2023    HGB 13.2 08/27/2023    HCT 39.5 08/27/2023    PLT  342 08/27/2023    ALT 12 10/21/2020    AST 18 10/21/2020    NA 139 08/27/2023    K 4.0 08/27/2023    CL 108 08/27/2023    CREAT 0.6 08/27/2023    BUN 12 08/27/2023    CO2 24 08/27/2023    TSH 0.84 10/21/2020    GLU 98 08/27/2023    ALKPHOS 57 10/21/2020       No results found.    ACTIVE PROBLEMS:   Problem List[7]    ASSESSMENT and PLAN:   23 y.o. female with hx of developmental delay, anxiety/depression, elipepsy who presents for:       Dominique was seen today for weight loss, medication refill and depression.    Diagnoses and all orders for this visit:    Losing weight chronic, unclear prognosis/diagnosis. Work up as below.  - exam unremarkable, could be2/2 severe depression. Sending to [facility] today. If still losing weight at urgent f/u or electrolyte disarray or abnormal labs, sending to ER for FTT  -     Hemoglobin A1C; Future  -     Lipid Panel; Future  -     HIV-1/2, Antigen and Antibody with Reflex to Confirmation; Future  -     Lipase; Future  -     Quantiferon(R) - TB Gold Plus; Future  -      Comprehensive Metabolic Panel; Future  -     CBC with Differential (Order); Future  -     Thyroid  Stimulating Hormone (TSH) with Reflex to Free T4; Future  -     Hemoglobin A1C  -     Lipid Panel  -     HIV-1/2, Antigen and Antibody with Reflex to Confirmation  -     Lipase  -     Quantiferon(R) - TB Gold Plus  -     Comprehensive Metabolic Panel  -     CBC with Differential (Order)  -     Thyroid  Stimulating Hormone (TSH) with Reflex to Free T4    Anxiety and depression chronic, uncontrolled  -sending to [facility] today        The following SDOH drivers were screened within the last 14 days and results reviewedExercise,Financial, Transportation,Food,Stress,Social,Violence,Alcohol,Utilities,Literacy,   Depression,    .      Patient identified to have concerns in the following SDOH Drivers: Exercise,Financial,   Stress,Social,   Literacy,   Depression,    .  Treatment course was impacted by these domains and the following interventions were performed to accommodate their impact: -  Tailored care-plan/instructions to accommodate SDOH needs          Return/ED precautions discussed. Reviewed medications prescribed including risks/benefits. Patient and/or parent of patient verbalized agreement and understanding of plan.    Follow up: Return in about 2 weeks (around 05/05/2024) for weight loss (1-2 weeks, urgent).     HCM: future          Gustav Miyamoto, MD  [facility]  April 21, 2024  10:23 AM           [1]   Past Medical History:  Diagnosis Date    Convulsions (CMS/HCC)     Depression    [2]   Past Surgical History:  Procedure Laterality Date    APPENDECTOMY (OPEN)     [3]   Family History  Problem Relation Name Age of Onset    No known problems Mother  No known problems Father     [4]   Social History  Tobacco Use    Smoking status: Never    Smokeless tobacco: Never   Vaping Use    Vaping status: Never Used   Substance Use Topics    Alcohol use: Never    Drug use: Never   [5] No Known  Allergies  [6]   Current Outpatient Medications:     Ferrous Sulfate (IRON PO), Take by mouth, Disp: , Rfl:     lamoTRIgine  (LaMICtal ) 150 MG tablet, Take 1 tablet (150 mg) by mouth 2 (two) times daily, Disp: 180 tablet, Rfl: 3    Multiple Vitamin (multivitamin) capsule, Take 1 capsule by mouth daily, Disp: 90 capsule, Rfl: 3    VITAMIN D PO, Take by mouth, Disp: , Rfl:     acetaminophen  (TYLENOL ) 325 MG tablet, Take 2 tablets (650 mg) by mouth every 6 (six) hours as needed for Pain (Patient not taking: Reported on 04/21/2024), Disp: 30 tablet, Rfl: 0    ondansetron  (ZOFRAN -ODT) 4 MG disintegrating tablet, Take 1 tablet (4 mg) by mouth every 6 (six) hours as needed for Nausea (Patient not taking: Reported on 04/21/2024), Disp: 8 tablet, Rfl: 0    vitamin B-12 (CYANOCOBALAMIN) 1000 MCG tablet, Take 1 tablet (1,000 mcg) by mouth daily (Patient not taking: Reported on 04/21/2024), Disp: , Rfl:   [7]   Patient Active Problem List  Diagnosis    Nonintractable epilepsy without status epilepticus, unspecified epilepsy type (CMS/HCC)    Anxiety and depression    Irritability    Losing weight

## 2024-04-21 NOTE — Progress Notes (Signed)
 Interpreter [phone]

## 2024-04-21 NOTE — Patient Instructions (Signed)
 Here is a list of Medicaid psychiatrists. It looks like many of them do video visits!    Psychiatrists    [facility])  [phone]  [place], [place]  [email or web address]    [facility] [address]  [place], [place]  [email or web address]    [facility]  [phone]  [place], [place]  [place], [place]  [email or web address]    [facility]  [phone]  [facility] & [place] locations  [email or web address]    [facility]  [phone]  Telehealth  [email or web address]    [employer] [employer] [facility]  [phone]  [place], [place]; Telehealth  [email or web address]    [facility]  [phone] [address] [place]  [email or web address]    [facility]  [phone]  [place], [place]; telehealth  [email or web address]  [email or web address]    [employer] [facility]  [phone]  [place], [place]  [email or web address]    [facility]  [phone]  [address]  [place], [place] [address]  [email or web address]  [email or web address]

## 2024-04-22 LAB — CBC AND DIFFERENTIAL
Baso(Absolute): 0 x10E3/uL (ref 0.0–0.2)
Basophils Automated: 0 %
Eosinophils Absolute: 0.1 x10E3/uL (ref 0.0–0.4)
Eosinophils Automated: 2 %
Hematocrit: 41.2 % (ref 34.0–46.6)
Hemoglobin: 14.3 g/dL (ref 11.1–15.9)
Immature Granulocytes Absolute: 0 x10E3/uL (ref 0.0–0.1)
Immature Granulocytes: 0 %
Lymphocytes Absolute: 1.9 x10E3/uL (ref 0.7–3.1)
Lymphocytes Automated: 34 %
MCH: 31.6 pg (ref 26.6–33.0)
MCHC: 34.7 g/dL (ref 31.5–35.7)
MCV: 91 fL (ref 79–97)
Monocytes Absolute: 0.5 x10E3/uL (ref 0.1–0.9)
Monocytes: 10 %
Neutrophils Absolute Count: 3 x10E3/uL (ref 1.4–7.0)
Neutrophils: 54 %
Platelets: 364 x10E3/uL (ref 150–450)
RBC: 4.53 x10E6/uL (ref 3.77–5.28)
RDW: 12 % (ref 11.7–15.4)
WBC: 5.5 x10E3/uL (ref 3.4–10.8)

## 2024-04-22 LAB — REFLEX - T4,FREE (DIRECT): T4, Free: 1.73 ng/dL (ref 0.82–1.77)

## 2024-04-22 LAB — LIPID PANEL
Cholesterol / HDL Ratio: 3.1 ratio (ref 0.0–4.4)
Cholesterol: 174 mg/dL (ref 100–199)
HDL: 56 mg/dL (ref >39)
LDL Chol Calculated (NIH): 106 mg/dL — ABNORMAL HIGH (ref 0–99)
Triglycerides: 62 mg/dL (ref 0–149)
VLDL Calculated: 12 mg/dL (ref 5–40)

## 2024-04-22 LAB — COMPREHENSIVE METABOLIC PANEL
ALT: 14 IU/L (ref 0–32)
AST (SGOT): 17 IU/L (ref 0–40)
Albumin: 5.1 g/dL — ABNORMAL HIGH (ref 4.0–5.0)
Alkaline Phosphatase: 57 IU/L (ref 41–116)
BUN / Creatinine Ratio: 17 (ref 9–23)
BUN: 11 mg/dL (ref 6–20)
Bilirubin, Total: 0.5 mg/dL (ref 0.0–1.2)
CO2: 21 mmol/L (ref 20–29)
Calcium: 10 mg/dL (ref 8.7–10.2)
Chloride: 104 mmol/L (ref 96–106)
Creatinine: 0.66 mg/dL (ref 0.57–1.00)
Globulin, Total: 2.5 g/dL (ref 1.5–4.5)
Glucose: 94 mg/dL (ref 70–99)
Potassium: 4.2 mmol/L (ref 3.5–5.2)
Protein, Total: 7.6 g/dL (ref 6.0–8.5)
Sodium: 141 mmol/L (ref 134–144)
eGFR: 125 mL/min/1.73 (ref >59)

## 2024-04-22 LAB — THYROID STIMULATING HORMONE (TSH) WITH REFLEX TO FREE T4: TSH: 0.017 u[IU]/mL — ABNORMAL LOW (ref 0.450–4.500)

## 2024-04-22 LAB — LIPASE: Lipase: 30 U/L (ref 14–72)

## 2024-04-22 LAB — HIV-1/2, ANTIGEN AND ANTIBODY WITH REFLEX TO CONFIRMATION: HIV Screen 4th Generation wRfx: NONREACTIVE

## 2024-04-22 LAB — HEMOGLOBIN A1C: Hemoglobin A1C: 5.2 % (ref 4.8–5.6)

## 2024-04-24 ENCOUNTER — Ambulatory Visit (INDEPENDENT_AMBULATORY_CARE_PROVIDER_SITE_OTHER): Payer: Self-pay | Admitting: Family Medicine

## 2024-04-24 DIAGNOSIS — R634 Abnormal weight loss: Secondary | ICD-10-CM

## 2024-04-24 LAB — QUANTIFERON(R) - TB GOLD PLUS
QuantiFERON Mitogen Value: >10 [IU]/mL
QuantiFERON Nil Value: 0 [IU]/mL
Quantiferon TB Gold Plus: NEGATIVE
Quantiferon TB1 Ag Value: 0 [IU]/mL
Quantiferon TB2 Ag Value: 0 [IU]/mL

## 2024-04-24 NOTE — Telephone Encounter (Addendum)
 RN spoke with mom, Jonna Jolly today with Dari interpreter ID#: [id number] to discuss pt's lab results. Mom confirmed pt's full name and DOB. RN informed mom that pt's labs overall look good. Her TSH was low but T4 was normal so the provider has placed an additional two tests for her to complete. Mom confirms her understanding and states they will do this on Wednesday at her appt.     RN instructed mom to have pt complete a chest x-ray. Order in chart. RN informed mom I would send her the imaging locations via MyChart so she can get this done, no appt required.     RN reminded mom to have pt attend [facility] as her mental health may be playing a component in all of this.     Mom confirms her understanding of information provided today, all questions answered.      ----- Message from Jaycee Gustav [name] Leontine, MD sent at 04/24/2024  2:14 PM EST -----  Call with following: I would use Dari interpreter also, they prefer it    Labs overall look good, electrolytes and blood counts are all normal. Your TSH is low meaning you could have hyperthyroidism which would explain your symptoms. However, your free T4 was normal, so   while one of your thyroid  hromones was normal, I do want to check your free T3 which is another one of your thyroid  hormones. I've put in the orders, you can come back to the lab here at clinic from   8:30 - 11 am or 1-4 pm any day Monday to Friday that we are open (we are closed on holidays), no appointment needed. In addition, I want her to get a chest Xray to just check for anything abnormal in   her lungs that could be causing this    - I do think her mental health is still playing a component in this. Please make sure to go to [facility] as discussed at our appointment, as I do not see you went in to see them yet!  ----- Message -----  From: Interface, [facility] Lab Results In  Sent: 04/22/2024   6:38 AM EST  To: Jaycee Gustav [name] Leontine, MD

## 2024-04-25 ENCOUNTER — Other Ambulatory Visit: Payer: Self-pay | Admitting: Family Medicine

## 2024-04-25 ENCOUNTER — Telehealth: Payer: Self-pay

## 2024-04-25 NOTE — Telephone Encounter (Signed)
 RN called patients mother back using Dari interpreter ID [phone], she verified name and patients name and DOB. RN provided [facility] phone number [phone] and [facility] scheduling [phone] to schedule Xray. Patients mother thanked CHARITY FUNDRAISER and had no questions at this time.

## 2024-04-26 ENCOUNTER — Encounter (INDEPENDENT_AMBULATORY_CARE_PROVIDER_SITE_OTHER): Payer: Self-pay | Admitting: Family Medicine

## 2024-04-26 ENCOUNTER — Ambulatory Visit (INDEPENDENT_AMBULATORY_CARE_PROVIDER_SITE_OTHER): Admitting: Family Medicine

## 2024-04-26 VITALS — BP 104/68 | HR 109 | Temp 98.3°F | Resp 16 | Ht 63.27 in | Wt 131.0 lb

## 2024-04-26 DIAGNOSIS — F32A Depression, unspecified: Secondary | ICD-10-CM

## 2024-04-26 DIAGNOSIS — F419 Anxiety disorder, unspecified: Secondary | ICD-10-CM

## 2024-04-26 DIAGNOSIS — R634 Abnormal weight loss: Secondary | ICD-10-CM

## 2024-04-26 DIAGNOSIS — R7989 Other specified abnormal findings of blood chemistry: Secondary | ICD-10-CM | POA: Insufficient documentation

## 2024-04-26 NOTE — Progress Notes (Signed)
 Interpreter [phone]

## 2024-04-26 NOTE — Patient Instructions (Signed)
 Here is a list of Medicaid psychiatrists. It looks like many of them do video visits!    Psychiatrists    [facility])  [phone]  [place], [place]  [email or web address]    [facility] [address]  [place], [place]  [email or web address]    [facility]  [phone]  [place], [place]  [place], [place]  [email or web address]    [facility]  [phone]  [facility] & [place] locations  [email or web address]    [facility]  [phone]  Telehealth  [email or web address]    [employer] [employer] [facility]  [phone]  [place], [place]; Telehealth  [email or web address]    [facility]  [phone] [address] [place]  [email or web address]    [facility]  [phone]  [place], [place]; telehealth  [email or web address]  [email or web address]    [employer] [facility]  [phone]  [place], [place]  [email or web address]    [facility]  [phone]  [address]  [place], [place] [address]  [email or web address]  [email or web address]

## 2024-04-26 NOTE — Progress Notes (Signed)
 Language Spoken: English and Dari  Interpreter ID#:  [id number], fluent in patient's spoken language    HPI:   This is a 23 y.o. female with hx of developmental delay, anxiety/depression, elipepsy who presents for:     Problem List Items Addressed This Visit       Anxiety and depression    Overview   Previously seeing a psychiatrist, last saw in June. But has appt with new psychiatrist next week  - was on aripirazole for depression but they stopped it.    - doing overall okay, no SI    04/21/24: has been losing weight because she isn't eating at all. Doesn't have a psychiatrist  - sending to [facility] today. Pt reports having intrusive thoughts, severe nightmares, severe depression, no SI.   - losing extensive weight, so getting bloodwork  04/26/24: pt did not go to [facility]. Reviewed again with the patient.         Losing weight - Primary    Overview   Wt Readings from Last 10 Encounters:   04/26/24 59.4 kg (131 lb)   04/21/24 59.9 kg (132 lb)   02/08/24 64.1 kg (141 lb 6.4 oz)   01/14/24 66.8 kg (147 lb 3.2 oz)   09/06/23 70.8 kg (156 lb)   12/18/22 69.4 kg (153 lb)   10/22/22 70.7 kg (155 lb 12.8 oz)   08/19/22 69.9 kg (154 lb)   06/17/22 69.9 kg (154 lb)   03/26/22 68 kg (150 lb)         02/08/24 Pt has been decreasing appetite to lose weight intentionally (reports people had previously commented on her weight), mom worried that she's too skinny, reassurance provided, continue to monitor  04/21/24: pt reports decreased appetite, barely eating  04/26/24: TSH was low, need free T3 and will repeat labs with thyroid  antibody panel  - Pt became upset about repeating bloodwork and voiced that she did not want to repeat bloodwork. We reviewed that the bloodwork would be helpful to determine appropriate management but that it was her decision to have bloodwork done or not. Offered to have her come back another day rather than today (bloodwork is ordered for her already). Pt abruptly stood up, put her jacket on and said I am sorry and  the pt and mother left clinic without getting bloodwork and without discussing follow up.         Relevant Orders    TSH    T3, Free    T4, Free    Thyroid  Stimulating Immunoglobulin    Thyroglobulin Antibody    Thyroid  Peroxidase Antibody    Low TSH level    Relevant Orders    TSH    T3, Free    T4, Free    Thyroid  Stimulating Immunoglobulin    Thyroglobulin Antibody    Thyroid  Peroxidase Antibody        Medical History[1]  Past Surgical History[2]  Family History[3]  Social History[4]    ALLERGIES:   Allergies[5]    MEDICATIONS:   Current Medications[6]    PHYSICAL EXAM:   BP 104/68 (BP Site: Right arm, Patient Position: Sitting, Cuff Size: Large)   Pulse (!) 109   Temp 98.3 F (36.8 C) (Oral)   Resp 16   Ht 1.607 m (5' 3.27)   Wt 59.4 kg (131 lb)   LMP 04/07/2024 (Exact Date)   SpO2 97%   BMI 23.01 kg/m     Physical Exam    Constitutional: well-appearing, in no acute  distress, sitting comfortably in the chair  HEENT: normocephalic/atraumatic. EOM grossly intact. MMM  NECK: supple  CARD: Regular rate  PULM: normal respiratory rate and effort.  GI: non-distended  MSK: moving all 4 extremities equally  SKIN: warm, dry. No visible rashes or lesions  NEURO: awake and alert. Converses and answers questions appropriately. No gross deficits.  PSYCH: pleasant affect.     DIAGNOSTICS:     Lab Results   Component Value Date    WBC 5.5 04/21/2024    HGB 14.3 04/21/2024    HCT 41.2 04/21/2024    PLT 364 04/21/2024    CHOL 174 04/21/2024    TRIG 62 04/21/2024    HDL 56 04/21/2024    LDL 106 (H) 04/21/2024    ALT 14 04/21/2024    AST 17 04/21/2024    NA 141 04/21/2024    K 4.2 04/21/2024    CL 104 04/21/2024    CREAT 0.66 04/21/2024    BUN 11 04/21/2024    CO2 21 04/21/2024    TSH 0.017 (L) 04/21/2024    GLU 94 04/21/2024    HGBA1C 5.2 04/21/2024    ALKPHOS 57 04/21/2024       No results found.    ACTIVE PROBLEMS:   Problem List[7]    ASSESSMENT and PLAN:   23 y.o. female with hx of developmental delay,  anxiety/depression, elipepsy who presents for:     Bassheva was seen today for follow-up.    Diagnoses and all orders for this visit:    Losing weight chronic, unclear prognosis/diagnosis. Work up as below.  TSH was low, need free T3 and will repeat labs with thyroid  antibody panel  - Pt became upset about repeating bloodwork and voiced that she did not want to repeat bloodwork. We reviewed that the bloodwork would be helpful to determine appropriate management but that it was her decision to have bloodwork done or not. Offered to have her come back another day rather than today (bloodwork is ordered for her already). Pt abruptly stood up, put her jacket on and said I am sorry and the pt and mother left clinic without getting bloodwork and without discussing follow up.  -     TSH; Future  -     T3, Free; Future  -     T4, Free; Future  -     TSH  -     T3, Free  -     T4, Free  -     Thyroid  Stimulating Immunoglobulin; Future  -     Thyroglobulin Antibody; Future  -     Thyroid  Peroxidase Antibody; Future  -     Thyroid  Stimulating Immunoglobulin  -     Thyroglobulin Antibody  -     Thyroid  Peroxidase Antibody    Low TSH level chronic, unclear prognosis/diagnosis. Work up as below.  TSH was low, need free T3 and will repeat labs with thyroid  antibody panel  - Pt became upset about repeating bloodwork and voiced that she did not want to repeat bloodwork. We reviewed that the bloodwork would be helpful to determine appropriate management but that it was her decision to have bloodwork done or not. Offered to have her come back another day rather than today (bloodwork is ordered for her already). Pt abruptly stood up, put her jacket on and said I am sorry and the pt and mother left clinic without getting bloodwork and without discussing follow up.  -  TSH; Future  -     T3, Free; Future  -     T4, Free; Future  -     TSH  -     T3, Free  -     T4, Free  -     Thyroid  Stimulating Immunoglobulin; Future  -      Thyroglobulin Antibody; Future  -     Thyroid  Peroxidase Antibody; Future  -     Thyroid  Stimulating Immunoglobulin  -     Thyroglobulin Antibody  -     Thyroid  Peroxidase Antibody    Anxiety and depression chronic, uncontrolled, referring to [facility] (gave address to mom and pt), reviewed with pt        The following SDOH drivers were screened within the last 14 days and results reviewedExercise,Financial, Transportation,Food,Stress,Social,Violence,Alcohol,Utilities,Literacy,   Depression,    .        Patient identified to have concerns in the following SDOH Drivers: Exercise,Financial,   Stress,Social,   Literacy,   Depression,    .  Treatment course was impacted by these domains and the following interventions were performed to accommodate their impact: -  Tailored care-plan/instructions to accommodate SDOH needs          Return/ED precautions discussed. Reviewed medications prescribed including risks/benefits. Patient and/or parent of patient verbalized agreement and understanding of plan.    Follow up: Return in about 4 weeks (around 05/24/2024) for weight check.     HCM: future          Gustav Miyamoto, MD  [facility]  April 26, 2024  1:32 PM           [1]   Past Medical History:  Diagnosis Date    Convulsions (CMS/HCC)     Depression    [2]   Past Surgical History:  Procedure Laterality Date    APPENDECTOMY (OPEN)     [3]   Family History  Problem Relation Name Age of Onset    No known problems Mother      No known problems Father     [4]   Social History  Tobacco Use    Smoking status: Never    Smokeless tobacco: Never   Vaping Use    Vaping status: Never Used   Substance Use Topics    Alcohol use: Never    Drug use: Never   [5] No Known Allergies  [6]   Current Outpatient Medications:     Ferrous Sulfate (IRON PO), Take by mouth, Disp: , Rfl:     lamoTRIgine  (LaMICtal ) 150 MG tablet, Take 1 tablet (150 mg) by mouth 2 (two) times daily, Disp: 180 tablet, Rfl: 3    Multiple Vitamin  (multivitamin) capsule, Take 1 capsule by mouth daily, Disp: 90 capsule, Rfl: 3    VITAMIN D PO, Take by mouth, Disp: , Rfl:     acetaminophen  (TYLENOL ) 325 MG tablet, Take 2 tablets (650 mg) by mouth every 6 (six) hours as needed for Pain (Patient not taking: Reported on 04/26/2024), Disp: 30 tablet, Rfl: 0    ondansetron  (ZOFRAN -ODT) 4 MG disintegrating tablet, Take 1 tablet (4 mg) by mouth every 6 (six) hours as needed for Nausea (Patient not taking: Reported on 04/26/2024), Disp: 8 tablet, Rfl: 0    vitamin B-12 (CYANOCOBALAMIN) 1000 MCG tablet, Take 1 tablet (1,000 mcg) by mouth daily (Patient not taking: Reported on 04/26/2024), Disp: , Rfl:   [7]   Patient Active Problem List  Diagnosis    Nonintractable epilepsy without status epilepticus, unspecified epilepsy type (CMS/HCC)    Anxiety and depression    Irritability    Losing weight    Low TSH level

## 2024-04-28 ENCOUNTER — Ambulatory Visit (INDEPENDENT_AMBULATORY_CARE_PROVIDER_SITE_OTHER): Payer: Self-pay | Admitting: Family Medicine

## 2024-05-02 ENCOUNTER — Ambulatory Visit (INDEPENDENT_AMBULATORY_CARE_PROVIDER_SITE_OTHER): Admitting: Family Medicine

## 2024-05-02 ENCOUNTER — Telehealth (INDEPENDENT_AMBULATORY_CARE_PROVIDER_SITE_OTHER): Payer: Self-pay

## 2024-05-02 ENCOUNTER — Ambulatory Visit (INDEPENDENT_AMBULATORY_CARE_PROVIDER_SITE_OTHER): Payer: Self-pay | Admitting: Family Medicine

## 2024-05-02 DIAGNOSIS — R634 Abnormal weight loss: Secondary | ICD-10-CM

## 2024-05-02 DIAGNOSIS — R7989 Other specified abnormal findings of blood chemistry: Secondary | ICD-10-CM

## 2024-05-02 LAB — TSH: TSH: 0.019 u[IU]/mL — ABNORMAL LOW (ref 0.450–4.500)

## 2024-05-02 LAB — T4, FREE: T4, Free: 1.57 ng/dL (ref 0.82–1.77)

## 2024-05-02 LAB — T3, FREE: T3, Free: 3.7 pg/mL (ref 2.0–4.4)

## 2024-05-02 NOTE — Telephone Encounter (Signed)
 R called patient using Dari ID # [id number] to review note from Dr. Nguyen. Patients mother answered call and stated daughter was at home and she was on the way to an appointment. RN let mother know RN can not speak with mother without the daughter present. Patients mother agreed to talk to RN tomorrow when patient was present. RN will call back tomorrow.

## 2024-05-03 ENCOUNTER — Other Ambulatory Visit (INDEPENDENT_AMBULATORY_CARE_PROVIDER_SITE_OTHER): Payer: Self-pay | Admitting: Family Medicine

## 2024-05-03 ENCOUNTER — Telehealth (INDEPENDENT_AMBULATORY_CARE_PROVIDER_SITE_OTHER): Payer: Self-pay

## 2024-05-03 DIAGNOSIS — F32A Depression, unspecified: Secondary | ICD-10-CM

## 2024-05-03 DIAGNOSIS — R634 Abnormal weight loss: Secondary | ICD-10-CM

## 2024-05-03 DIAGNOSIS — R6250 Unspecified lack of expected normal physiological development in childhood: Secondary | ICD-10-CM

## 2024-05-03 NOTE — Telephone Encounter (Addendum)
 RN spoke to patients mother with patient present regarding note from Dr. Nguyen using Dari interpreter ID [phone], patient name and DOB verified. Patient verbalized understanding of information provided and had no questions at this time.    ----- Message from Jaycee Gustav [name] Leontine, MD sent at 05/03/2024 12:08 PM EST -----  Can you call them and let them know the following:    Based on my discussion with our experts in this area (risk management), I am recommending that the patient's capacity to make her own medical decisions be evaluated by neuropsychology as her intellectual disability history is unclear. I have put in the referral (see below for scheduling info)  - in addition, I've reached out to your Neurologist Dr. Marvis to see if she is able to do this, but for now, I would plan to make an appointment to see neuropsychology    We cannot advise you on any legal process, but if you would like to pursue a POA or guardianship, you would need to do that independently.    Option 1:  Look for an email, phone call or text message from [facility] in the next 24 - 48 hours to guide you in scheduling your appointment.     Option 2:  Go to your MyChart home page to see your appointment scheduling message, then select Schedule Now.      Option 3:  If you are unable to schedule your appointment with the above options, please call [phone] to schedule your appointment.     #Note: If your insurance requires a Referral, please contact your Referring Provider's Office or Primary Care Provider to obtain your insurance authorization before you schedule.

## 2024-05-03 NOTE — Telephone Encounter (Addendum)
 Created in error.  ----- Message from Jaycee Gustav [name] Leontine, MD sent at 05/03/2024 12:08 PM EST -----  Can you call them and let them know the following:    Based on my discussion with our experts in this area (risk management), I am recommending that the patient's capacity to make her own medical decisions be evaluated by neuropsychology as her intellectual disability history is unclear. I have put in the referral (see below for scheduling info)  - in addition, I've reached out to your Neurologist Dr. Marvis to see if she is able to do this, but for now, I would plan to make an appointment to see neuropsychology    We cannot advise you on any legal process, but if you would like to pursue a POA or guardianship, you would need to do that independently.    Option 1:  Look for an email, phone call or text message from [facility] in the next 24 - 48 hours to guide you in scheduling your appointment.     Option 2:  Go to your MyChart home page to see your appointment scheduling message, then select Schedule Now.      Option 3:  If you are unable to schedule your appointment with the above options, please call [phone] to schedule your appointment.     #Note: If your insurance requires a Referral, please contact your Referring Provider's Office or Primary Care Provider to obtain your insurance authorization before you schedule.

## 2024-05-04 ENCOUNTER — Encounter (INDEPENDENT_AMBULATORY_CARE_PROVIDER_SITE_OTHER): Payer: Self-pay | Admitting: "Endocrinology

## 2024-05-04 ENCOUNTER — Telehealth (INDEPENDENT_AMBULATORY_CARE_PROVIDER_SITE_OTHER): Payer: Self-pay

## 2024-05-04 ENCOUNTER — Ambulatory Visit (INDEPENDENT_AMBULATORY_CARE_PROVIDER_SITE_OTHER): Admitting: Family Medicine

## 2024-05-04 ENCOUNTER — Other Ambulatory Visit (INDEPENDENT_AMBULATORY_CARE_PROVIDER_SITE_OTHER): Payer: Self-pay | Admitting: Family Medicine

## 2024-05-04 ENCOUNTER — Other Ambulatory Visit (INDEPENDENT_AMBULATORY_CARE_PROVIDER_SITE_OTHER): Payer: Self-pay | Admitting: "Endocrinology

## 2024-05-04 DIAGNOSIS — R7989 Other specified abnormal findings of blood chemistry: Secondary | ICD-10-CM

## 2024-05-04 DIAGNOSIS — E0590 Thyrotoxicosis, unspecified without thyrotoxic crisis or storm: Secondary | ICD-10-CM

## 2024-05-04 DIAGNOSIS — R634 Abnormal weight loss: Secondary | ICD-10-CM

## 2024-05-04 NOTE — Telephone Encounter (Addendum)
 Attempt #1    RN spoke to patients mom Genette and patient was present on phone call regarding the following note from Dr. Leontine using Dari interpreter ID # [id number], patients name and DOB verified. RN reminded if she goes earlier the better and if she is turned away due to capacity full to talk to front desk on best time to go, suggested typically early morning. Patients mother and daughter present on call verbalized understanding of information provided and had no questions at this time and stated they will get ready to go to [facility], she noted she will have to get transportation today because her spouse has vehicle.       ----- Message from Jaycee Gustav [name] Leontine, MD sent at 05/04/2024  9:15 AM EST -----  Hi Junella,    Can you call mom and let them know that I've spoken with [facility] and the physicians there who are willing to evaluate her. I recommend that they go today to be evaluated by [facility], and the sooner they go, the better since later in the day, they may have to turn away patients if there are too many patients. The front desk at [facility] knows that patient can be evaluated by [facility] but if for some reason, they run into any trouble, have them call us .     Gustav Leontine, MD  [facility]  May 04, 2024  9:28 AM

## 2024-05-04 NOTE — Progress Notes (Signed)
 [facility] E-Consult     Date:  May 04, 2024   Name: Dominique Parker   MRN:   [id number]   Age:    23 y.o.   DOB:   [birth year]   SEX:    female     Consulting Provider:  Leontine Jaycee Gustav [name], MD  [address]  [place],  [place] [address]     Consult Question:    Patient with extensive weight loss, TSH low with normal free T3 and free T4. Would you do anything for treatment for subclinical hyperthyroidism with her extensive weight loss? (See weights below). Initially patient was eating less to try to lose weight, but now she is no longer intentionally losing weight. She has an unclear psychiatric history, and working on getting her psychiatric help at this time.     Consultant Response:   23yo F with new onset suppressed TSH, recent clinic visit with HR documented 109bpm. No prior thyroid  imaging for review. No evidence of current hepatic dysfunction or alk phos elevation. Lamotrigine  not typically associated with suppressed TSH, but rather small risk of hypothyroidism and weight gain/loss.     Recommendations:  Agree with antibody testing: thyroid  stimulating immunoglobulin and thyrotropin receptor antibody to evaluate for Graves' disease  US  neck to evaluate for nodules  Ensure pt not on medications that can suppress TSH such as biotin or steroids  Consider beta blockade if tachycardia persistent - atenolol or propranolol are good options in the setting of hyperthyroidism  If antibodies above are normal, recommend nuclear medicine thyroid  uptake/scan - consider dx of thyroiditis vs toxic nodules depending on imaging results  Recommend urgent endocrine referral for expedited appointment within 30d due to symptomatic disease     Component      Latest Ref Rng 10/21/2020 04/21/2024 05/01/2024   TSH      0.450 - 4.500 uIU/mL 0.84  0.017 (L)  0.019 (L)    T4 Free      0.82 - 1.77 ng/dL  8.26  8.42    T3 Free      2.0 - 4.4 pg/mL   3.7         Thank you for this e-consultation.    15 minutes total time spent  performing consultation. Recommendations provided by written communication.     The above treatment considerations and suggestions are based on review of the consulting provider's most recent note and any additional information provided. I have not personally examined the patient. All recommendations should be implemented with consideration of the patient's relevant prior history and current clinical status.      Please feel free to secure chat me Demetria Chain, MD) if you have any additional questions regarding management of this patient.  If additional assistance is needed after 14 days, an additional e-consult order may be requested.     Carlyn Chain, MD  Endocrinology, Diabetes & Metabolism   Medical Group

## 2024-05-05 ENCOUNTER — Telehealth (INDEPENDENT_AMBULATORY_CARE_PROVIDER_SITE_OTHER): Payer: Self-pay

## 2024-05-05 NOTE — Telephone Encounter (Signed)
 RN called patient using Dari interpreter to review the following information below. Patient did not answer call, Dari interpreter ID # [id number] left voicemail requesting return call at to [phone].    The e-consult from endocrinology just got back to me today with the following recommendations:     1) They would recommend getting more thyroid  labs to check, the orders are in, they can go to any [facility] to get them done or come back to our clinic.   2) I have ordered an US  of the neck to evaluate for thyroid  nodules   3) I've put in an urgent endo referral for expedited appointment within 30 days, I've put in the referral but can we call and help make sure they get in within that time frame?

## 2024-05-08 ENCOUNTER — Telehealth (HOSPITAL_BASED_OUTPATIENT_CLINIC_OR_DEPARTMENT_OTHER): Payer: Self-pay

## 2024-05-08 NOTE — Telephone Encounter (Addendum)
 08:04- As requested, Wt left VM for patient's mother, Bryana Froemming requesting call back to set up appointment to meet with provider for patient.

## 2024-05-09 ENCOUNTER — Telehealth (HOSPITAL_BASED_OUTPATIENT_CLINIC_OR_DEPARTMENT_OTHER): Payer: Self-pay

## 2024-05-09 ENCOUNTER — Encounter (INDEPENDENT_AMBULATORY_CARE_PROVIDER_SITE_OTHER): Payer: Self-pay

## 2024-05-09 ENCOUNTER — Telehealth: Payer: Self-pay

## 2024-05-09 ENCOUNTER — Encounter (INDEPENDENT_AMBULATORY_CARE_PROVIDER_SITE_OTHER): Payer: Self-pay | Admitting: Family Medicine

## 2024-05-09 NOTE — Telephone Encounter (Signed)
 Note to Scheduling Agent:  Please forward this telephone encounter as high priority to the [employer] for the location closest to the patient's home.     This telephone encounter is submitted on behalf of: Dominique Parker    The patient is requesting to schedule a new appointment for evaluation and management of Hyperthyroidism/Graves' Disease.  The patient has been informed and has verbally acknowledged that the review process may take up to 72 business hours. Once reviewed, the patient will be contacted to schedule the appropriate appointment.  If the patient does not receive a call within 72 business hours, they have been advised to follow up by calling back to check the status of their request.

## 2024-05-09 NOTE — Progress Notes (Signed)
 RN called patient and patients mother name and DOB verified for patient to review the following note from Dr. Nguyen: The e-consult from endocrinology just got back to me today with the following recommendations:     1) They would recommend getting more thyroid  labs to check, the orders are in, they can go to any [facility] to get them done or come back to our clinic.   2) I have ordered an US  of the neck to evaluate for thyroid  nodules   3) I've put in an urgent endo referral for expedited appointment within 30 days.    RN printed referrals for ultrasound and endocrinology for patient, she said she will come to the clinic to do lab work requested and pick up at that time. RN called Endocrinology at [phone] spoke to [place] and she noted she sent high priority message to [facility] to review notes and assist to schedule patient and that it could take up to 72 hours for patient to get phone call. RN will let patient know when she picks up referrals at office.

## 2024-05-09 NOTE — Telephone Encounter (Signed)
 E-consult completed by Dr Grady on 05-04-24.     Recommendation was Recommend urgent endocrine referral for expedited appointment within 30d due to symptomatic disease    Secure chat sent to [facility] team to schedule appt with Dr Grady and 6wk TOC appt.

## 2024-05-10 LAB — THYROID STIMULATING IMMUNOGLOBULIN: Thyroid Stimulating Immunoglobulin: <0.1 IU/L (ref 0.00–0.55)

## 2024-05-10 LAB — THYROGLOBULIN ANTIBODY: THYROGLOBULIN AB: <1 [IU]/mL (ref 0.0–0.9)

## 2024-05-10 LAB — THYROID PEROXIDASE ANTIBODY: Thyroid Peroxidase (TPO) AB: 9 [IU]/mL (ref 0–34)

## 2024-05-10 NOTE — Telephone Encounter (Signed)
 Pt scheduled 05/17/24 with Dr. Jayaratne.

## 2024-05-17 ENCOUNTER — Encounter (INDEPENDENT_AMBULATORY_CARE_PROVIDER_SITE_OTHER): Payer: Self-pay | Admitting: Endocrinology, Diabetes and Metabolism

## 2024-05-17 ENCOUNTER — Telehealth (INDEPENDENT_AMBULATORY_CARE_PROVIDER_SITE_OTHER): Admitting: Endocrinology, Diabetes and Metabolism

## 2024-05-17 ENCOUNTER — Other Ambulatory Visit (INDEPENDENT_AMBULATORY_CARE_PROVIDER_SITE_OTHER): Payer: Self-pay | Admitting: Family Medicine

## 2024-05-17 ENCOUNTER — Ambulatory Visit: Admission: RE | Admit: 2024-05-17 | Discharge: 2024-05-17 | Attending: Family Medicine

## 2024-05-17 ENCOUNTER — Ambulatory Visit

## 2024-05-17 DIAGNOSIS — R634 Abnormal weight loss: Secondary | ICD-10-CM | POA: Insufficient documentation

## 2024-05-17 DIAGNOSIS — R7989 Other specified abnormal findings of blood chemistry: Secondary | ICD-10-CM | POA: Insufficient documentation

## 2024-05-17 NOTE — Progress Notes (Signed)
 Chief Complaint:  Chief Complaint   Patient presents with    Initial Consult      Hyperthyroidism       HPI:  Dominique Parker is a 23 y.o. female with hx of  developmental delay, anxiety/depression, elipepsy who presents for evaluation of suppressed TSH.     Due to language barrier, the appropriate medical translator was utilized to ensure accurate information was obtained and communication was unimpeded throughout the visit.     Mother provides history mainly. Patient with mother during this appointment.     Per mother, has noticed patient has had weight loss for the past 3 months - about 10 pounds loss.   Workup with PCP noted for low TSH with normal free T4, T3. Last TSH 05/01/2024 - 0.019    According to mother, patient confirms she still has a poor appetite (skipped lunch yesterday).    Symptom overview -   Poor appetite - yes still  Palpitations - denies  Diarrhea - denies  Heat intolerance - denies  Menses - regular; monthly basis;  LMP - 1 week ago.   Tremors - mom noticed tremors in right hand last night; sightly.     New medications - none  No recent contrast imaging completed.   Denies neck pain or sore throat before onset of weight loss .    Neck ultrasound scheduled to be completed today.     Patient denies dysphagia, voice changes, or compressive symptoms.  she does not have a history of RT exposure.  she does not have a family history of thyroid  cancer.  There is no family history of thyroid  disease.      Labs:    Component      Latest Ref Rng 10/21/2020 04/21/2024 05/01/2024 05/09/2024   TSH      0.450 - 4.500 uIU/mL 0.84  0.017 (L)  0.019 (L)     T4 Free      0.82 - 1.77 ng/dL  8.26  8.42     T3 Free      2.0 - 4.4 pg/mL   3.7     Thyroid  Stimulating Immunoglobulin      0.00 - 0.55 IU/L    <0.10    Thyroglobulin Antibodies      0.0 - 0.9 IU/mL    <1.0    Thyroid  Peroxidase (TPO) AB      0 - 34 IU/mL    9      Problem List:  Problem List[1]    Current Medications:  Medications Ordered Prior to  Encounter[2]    Allergies:  Allergies[3]    Past Medical History:  Medical History[4]    Past Surgical History:  Past Surgical History[5]    Family History:  Family History[6]    Social History:  Social History[7]      Visit Vitals  Ht 1.626 m (5' 4)   Wt 59.4 kg (131 lb)   LMP 04/07/2024 (Exact Date)   BMI 22.49 kg/m        BP Readings from Last 3 Encounters:   04/26/24 104/68   04/21/24 101/61   02/08/24 96/67        Wt Readings from Last 4 Encounters:   05/17/24 1054 59.4 kg (131 lb)   04/26/24 1303 59.4 kg (131 lb)   04/21/24 0935 59.9 kg (132 lb)   02/08/24 1320 64.1 kg (141 lb 6.4 oz)        Physical Exam:  GENERAL APPEARANCE: alert, in no acute  distress, well developed, well nourished  ORAL CAVITY: normal oropharynx, normal oral mucosa, normal dentition  EYES: EOMI, no proptosis, no conjunctival erythema, no lid lag  NECK/THYROID : neck supple, no cervical lymphadenopathy, no thyromegaly, no thyroid  tenderness, no palpable thyroid  nodules        Assessment/Plan:    Unintentional weight loss  Subclinical hyperthyroidism  - Recently noted low TSH since 04/21/2024 with normal t4, t3   - review of medications would not cause significant thyroid  abnormalities/suppressed TSH.   - thyroid  antibodies negative  - pending neck U/S - scheduled to complete today  - compete NM thyroid  uptake scan rule out toxic nodule  - no compressive symptoms/goiter  - Denies palpitations at this time. Counseled patient and mother of other signs and symptoms of hyperthyroidism to look out for and to message/call clinic if experiencing them to check thyroid  labs +/- propanolol.   - Have close follow-up to review imaging studies.     RTO in 6 weeks     Valere CHRISTELLA Nan, DO     Telemedicine Documentation Requirements     Originating site (Patient location): home  Distant site (Provider location): office  Provider and Title: Dr. Hebert Dooling  Verbal consent has been obtained from the patient to conduct a video and telephone visit to minimize  exposure to COVID-19: yes   Language, if applicable and if translator was required: English  VIDEO VISIT  Time spent : 45 min           [1]   Patient Active Problem List  Diagnosis    Nonintractable epilepsy without status epilepticus, unspecified epilepsy type (CMS/HCC)    Anxiety and depression    Irritability    Losing weight    Low TSH level   [2]   Current Outpatient Medications on File Prior to Visit   Medication Sig Dispense Refill    Ferrous Sulfate (IRON PO) Take by mouth      lamoTRIgine  (LaMICtal ) 150 MG tablet Take 1 tablet (150 mg) by mouth 2 (two) times daily 180 tablet 3    Multiple Vitamin (multivitamin) capsule Take 1 capsule by mouth daily 90 capsule 3    VITAMIN D PO Take by mouth      acetaminophen  (TYLENOL ) 325 MG tablet Take 2 tablets (650 mg) by mouth every 6 (six) hours as needed for Pain (Patient not taking: Reported on 05/17/2024) 30 tablet 0    ondansetron  (ZOFRAN -ODT) 4 MG disintegrating tablet Take 1 tablet (4 mg) by mouth every 6 (six) hours as needed for Nausea (Patient not taking: Reported on 05/17/2024) 8 tablet 0    vitamin B-12 (CYANOCOBALAMIN) 1000 MCG tablet Take 1 tablet (1,000 mcg) by mouth daily (Patient not taking: Reported on 05/17/2024)       No current facility-administered medications on file prior to visit.   [3] No Known Allergies  [4]   Past Medical History:  Diagnosis Date    Convulsions (CMS/HCC)     Depression    [5]   Past Surgical History:  Procedure Laterality Date    APPENDECTOMY (OPEN)     [6]   Family History  Problem Relation Name Age of Onset    No known problems Mother      No known problems Father     [7]   Social History  Socioeconomic History    Marital status: Single   Tobacco Use    Smoking status: Never    Smokeless tobacco: Never   Vaping Use  Vaping status: Never Used   Substance and Sexual Activity    Alcohol use: Never    Drug use: Never    Sexual activity: Never     Social Drivers of Psychologist, Prison And Probation Services Strain: Medium Risk (05/17/2024)     Overall Financial Resource Strain (CARDIA)     Difficulty of Paying Living Expenses: Somewhat hard   Food Insecurity: Food Insecurity Present (05/17/2024)    Hunger Vital Sign     Worried About Running Out of Food in the Last Year: Sometimes true     Ran Out of Food in the Last Year: Sometimes true   Transportation Needs: Unmet Transportation Needs (05/17/2024)    PRAPARE - Therapist, Art (Medical): Yes     Lack of Transportation (Non-Medical): Yes   Physical Activity: Inactive (05/17/2024)    Exercise Vital Sign     Days of Exercise per Week: 0 days     Minutes of Exercise per Session: 0 min   Stress: Stress Concern Present (05/17/2024)    [employer] of Occupational Health - Occupational Stress Questionnaire     Feeling of Stress : Very much   Social Connections: Socially Isolated (04/26/2024)    Social Connection and Isolation Panel     Frequency of Communication with Friends and Family: More than three times a week     Frequency of Social Gatherings with Friends and Family: More than three times a week     Attends Religious Services: Never     Database Administrator or Organizations: No     Attends Banker Meetings: Never     Marital Status: Never married   Intimate Partner Violence: Not At Risk (05/17/2024)    Humiliation, Afraid, Rape, and Kick questionnaire     Fear of Current or Ex-Partner: No     Emotionally Abused: No     Physically Abused: No     Sexually Abused: No   Housing Stability: Not At Risk (05/17/2024)    Housing Stability NCSS     Do you have housing?: Yes     Are you worried about losing your housing?: No

## 2024-05-18 ENCOUNTER — Ambulatory Visit (INDEPENDENT_AMBULATORY_CARE_PROVIDER_SITE_OTHER): Payer: Self-pay | Admitting: Family Medicine

## 2024-05-20 ENCOUNTER — Encounter (INDEPENDENT_AMBULATORY_CARE_PROVIDER_SITE_OTHER): Payer: Self-pay

## 2024-05-20 ENCOUNTER — Telehealth (INDEPENDENT_AMBULATORY_CARE_PROVIDER_SITE_OTHER): Payer: Self-pay | Admitting: Endocrinology, Diabetes and Metabolism

## 2024-05-20 NOTE — Telephone Encounter (Signed)
 LVM & MyChart message sent for patient to call back to schedule a 40 min follow up with Dr. Jayaratne. Please use follow up order to schedule and if assistance is needed, please send sc.

## 2024-05-24 ENCOUNTER — Encounter (INDEPENDENT_AMBULATORY_CARE_PROVIDER_SITE_OTHER): Payer: Self-pay | Admitting: Endocrinology, Diabetes and Metabolism

## 2024-05-25 NOTE — Progress Notes (Signed)
 Called patient's mother in regards to lab results - but mother said they are currently in the hospital and unable to speak at this time.     Neck Ultrasound with no finding of thyroid  nodules.   NM thyroid  uptake scan had been ordered at previous visit - needs to be scheduled to evaluate for thyroiditis vs toxic nodule (unlikely given U/S results).     Given NM thyroid  uptake scan normal - consideration of central hypothyroidism > subclinical hyperthyroidism.     Dr. Lewi Drost  [facility] [place] [facility]

## 2024-06-13 ENCOUNTER — Telehealth: Payer: Self-pay | Admitting: Endocrinology, Diabetes and Metabolism

## 2024-06-13 NOTE — Telephone Encounter (Signed)
 LVM and sent a MCM to schedule a 40 min visit with Dr. Stevan, asap. Please send sc to schedule.

## 2024-06-13 NOTE — Telephone Encounter (Signed)
 Copied from CRM #[id number]. Topic: Appointment Scheduling - Schedule Appointment  >> Jun 13, 2024  8:17 AM Asberry MATSU wrote:  Dominique Parker called about Appointment Scheduling - Schedule Appointment.  Additional details:    Patient's mother is calling stating she has an appointment on 1/14 with Dr. Gordy, agent advised patient's mother that there is no appointment scheduled or canceled for this date with provider. Agent tried to assist with scheduling next available in April for VV, but mother declined. She states that her daughter Shannel is continuing to lose weight and we recently in the hospital and needs to be seen urgently by provider. Please confer with Dr. Gordy to determine if patient needs urgent appt or can be scheduled urgently.

## 2024-07-04 ENCOUNTER — Encounter (INDEPENDENT_AMBULATORY_CARE_PROVIDER_SITE_OTHER): Payer: Self-pay

## 2024-07-04 ENCOUNTER — Ambulatory Visit (INDEPENDENT_AMBULATORY_CARE_PROVIDER_SITE_OTHER): Admitting: Family Medicine

## 2024-07-04 ENCOUNTER — Encounter (INDEPENDENT_AMBULATORY_CARE_PROVIDER_SITE_OTHER): Payer: Self-pay | Admitting: Family Medicine

## 2024-07-04 ENCOUNTER — Telehealth (INDEPENDENT_AMBULATORY_CARE_PROVIDER_SITE_OTHER): Payer: Self-pay | Admitting: Endocrinology, Diabetes and Metabolism

## 2024-07-04 VITALS — BP 112/70 | HR 80 | Temp 98.3°F | Resp 14 | Ht 62.6 in | Wt 126.0 lb

## 2024-07-04 DIAGNOSIS — R634 Abnormal weight loss: Secondary | ICD-10-CM

## 2024-07-04 DIAGNOSIS — F419 Anxiety disorder, unspecified: Secondary | ICD-10-CM

## 2024-07-04 DIAGNOSIS — G40909 Epilepsy, unspecified, not intractable, without status epilepticus: Secondary | ICD-10-CM

## 2024-07-04 DIAGNOSIS — F32A Depression, unspecified: Secondary | ICD-10-CM

## 2024-07-04 DIAGNOSIS — Z758 Other problems related to medical facilities and other health care: Secondary | ICD-10-CM

## 2024-07-04 DIAGNOSIS — N926 Irregular menstruation, unspecified: Secondary | ICD-10-CM | POA: Insufficient documentation

## 2024-07-04 DIAGNOSIS — Z603 Acculturation difficulty: Secondary | ICD-10-CM

## 2024-07-04 NOTE — Progress Notes (Signed)
 Interpreter [phone]

## 2024-07-04 NOTE — Telephone Encounter (Signed)
 Called [phone]  to give imaging #s [place] [phone]  [facility] [phone]    LOV with phone # and that I will send [facility] with #'s or call the office back if that is easier

## 2024-07-04 NOTE — Patient Instructions (Addendum)
 Here is a list of Medicaid psychiatrists. It looks like many of them do video visits!    Psychiatrists    [facility])  [phone]  [place], [place]  [email or web address]    [facility] [address]  [place], [place]  [email or web address]    [facility] Picker  [phone]  [place], [place]  [place], [place]  [email or web address]    [facility]  [phone]  [facility] [place] locations  [email or web address]    [facility]  [phone]  Telehealth  [email or web address]    [facility]  [phone]  [place], [place]; Telehealth  [email or web address]    [facility]ord[facility] Levan  [phone] [address] [place]  [email or web address]    [facility]  [phone]  [place], [place]; telehealth  [email or web address]  [email or web address]    [employer] [facility]  [phone]  [place], [place]  [email or web address]    [facility]  [phone]  [address]  [place], [place] [address]  [email or web address]  [email or web address]

## 2024-07-04 NOTE — Progress Notes (Signed)
 Language Spoken: English and dari  Interpreter ID#:  n/a, fluent in patient's spoken language    HPI:   This is a 24 y.o. female with hx of developmental delay, anxiety/depression, elipepsy who presents for:     Problem List Items Addressed This Visit       Nonintractable epilepsy without status epilepticus, unspecified epilepsy type (CMS/HCC)    Overview   Lamictal  150 mg BID, seeing Neuro, last seen 01/14/24. Deferred lamictal  increase.  - last seizure 11/2023         Anxiety and depression    Overview   Previously seeing a psychiatrist, last saw in June. But has appt with new psychiatrist next week  - was on aripirazole for depression but they stopped it.    - doing overall okay, no SI    04/21/24: has been losing weight because she isn't eating at all. Doesn't have a psychiatrist  - sending to [facility] today. Pt reports having intrusive thoughts, severe nightmares, severe depression, no SI.   - losing extensive weight, so getting bloodwork  04/26/24: [facility]. Reviewed again with the patient.    1/20/26[name] [facility] at [address], [place], [place] [address]. They said she needed to see neurology first. Reports they did see a therapist  - she reports she went to [facility] and was told she needed to go back to her PCP. There are no records in the system but pt gave me the address where she went and it does match with [facility]  - has neuro appt 07/12/24         Losing weight - Primary    Overview   Wt Readings from Last 10 Encounters:   07/04/24 57.2 kg (126 lb)   05/17/24 59.4 kg (131 lb)   04/26/24 59.4 kg (131 lb)   04/21/24 59.9 kg (132 lb)   02/08/24 64.1 kg (141 lb 6.4 oz)   01/14/24 66.8 kg (147 lb 3.2 oz)   09/06/23 70.8 kg (156 lb)   12/18/22 69.4 kg (153 lb)   10/22/22 70.7 kg (155 lb 12.8 oz)   08/19/22 69.9 kg (154 lb)         02/08/24 Pt has been decreasing appetite to lose weight intentionally (reports people had previously commented on her weight), mom worried  that she's too skinny, reassurance provided, continue to monitor  04/21/24: pt reports decreased appetite, barely eating  04/26/24: TSH was low, need free T3 and will repeat labs with thyroid  antibody panel  - Pt became upset about repeating bloodwork and voiced that she did not want to repeat bloodwork. We reviewed that the bloodwork would be helpful to determine appropriate management but that it was her decision to have bloodwork done or not. Offered to have her come back another day rather than today (bloodwork is ordered for her already). Pt abruptly stood up, put her jacket on and said I am sorry and the pt and mother left clinic without getting bloodwork and without discussing follow up.    05/02/24: pt went to see [facility] and told they couldn't be seen without guardianship or POA documents from mom.  - spoke with risk management for recs, messaged her neuro Dr. Marvis for further recs also and if willing to do capacity eval, recs pending    Endo e consult recs: labs, US  of neck to evaluate for thyroid  nodules, urgent endo ref for expedited appointment within 30 days due to  symptomatic disease  05/04/24 Endo appt: doing NM thyroid  uktake scan  05/23/25: endo work up has been unremarkable but still needs NM thyroid  uptake scan, never saw [facility]         Relevant Orders    CT Chest Abdomen Pelvis W Contrast        Medical History[1]  Past Surgical History[2]  Family History[3]  Social History[4]    ALLERGIES:   Allergies[5]    MEDICATIONS:   Current Medications[6]    PHYSICAL EXAM:   BP 112/70 (BP Site: Right arm, Patient Position: Sitting, Cuff Size: Medium)   Pulse 80   Temp 98.3 F (36.8 C) (Oral)   Resp 14   Ht 1.59 m (5' 2.6)   Wt 57.2 kg (126 lb)   LMP 06/27/2024 (Exact Date)   SpO2 99%   BMI 22.61 kg/m     Physical Exam    Constitutional: well-appearing, in no acute distress, sitting comfortably in the chair  HEENT: normocephalic/atraumatic. EOM grossly intact. MMM  NECK: supple  CARD: Regular  rate  PULM: normal respiratory rate and effort.  GI: non-distended  MSK: moving all 4 extremities equally  SKIN: warm, dry. No visible rashes or lesions  NEURO: awake and alert. Converses and answers questions appropriately. No gross deficits.  PSYCH: pleasant affect.     DIAGNOSTICS:     Lab Results   Component Value Date    WBC 5.5 04/21/2024    HGB 14.3 04/21/2024    HCT 41.2 04/21/2024    PLT 364 04/21/2024    CHOL 174 04/21/2024    TRIG 62 04/21/2024    HDL 56 04/21/2024    LDL 106 (H) 04/21/2024    ALT 14 04/21/2024    AST 17 04/21/2024    NA 141 04/21/2024    K 4.2 04/21/2024    CL 104 04/21/2024    CREAT 0.66 04/21/2024    BUN 11 04/21/2024    CO2 21 04/21/2024    TSH 0.019 (L) 05/01/2024    GLU 94 04/21/2024    HGBA1C 5.2 04/21/2024    ALKPHOS 57 04/21/2024       No results found.    ACTIVE PROBLEMS:   Problem List[7]    ASSESSMENT and PLAN:   24 y.o. female with hx of developmental delay, anxiety/depression, elipepsy who presents for:       Dominique Parker was seen today for follow-up and depression.    Diagnoses and all orders for this visit:    Losing weight chronic, unclear prognosis/diagnosis. Work up as below.   - continue endo work up, messaged endo to help with uptake scan scheduling  -     CT Chest Abdomen Pelvis W Contrast; Future    Anxiety and depression chronic, recurring. No SI  - uncontrolled, has therapist now, [facility]. Needs to see neuro and then will call to get appt with psychiatrists    Nonintractable epilepsy without status epilepticus, unspecified epilepsy type (CMS/HCC) chronic, recurring.  - continue lamictal , has appt 07/12/24    Abnormal menses: chronic, recurring. Periods lightening. Suspect 2/2 weight loss  - anticipatory guidance given     Language barrier affecting healthcare  - Phone interpreter used as above or as documented by MA    The following SDOH drivers were screened within the last 14 days and results reviewed       Violence,     Depression,    .      Patient  identified to have concerns in  the following SDOH Drivers: Exercise,Financial, Transportation,Food,Stress,Social,   Literacy,   Depression,    .  Treatment course was impacted by these domains and the following interventions were performed to accommodate their impact: -  Tailored care-plan/instructions to accommodate SDOH needs           Return/ED precautions discussed. Reviewed medications prescribed including risks/benefits. Patient and/or parent of patient verbalized agreement and understanding of plan.    Follow up: Return in about 2 months (around 09/01/2024) for weight loss, f/u CT scan results.     Face to face: 30 (extensive discussion and counselling rewgarding weight and anxiety and depression)  Non Face-to-Face (precharting, chart/record review, orders, care coordination, documentation, forms): 5  Total time for today's visit not including any procedures: 35        Gustav Miyamoto, MD  [facility] [place]  July 04, 2024  2:23 PM           [1]   Past Medical History:  Diagnosis Date    Convulsions (CMS/HCC)     Depression    [2]   Past Surgical History:  Procedure Laterality Date    APPENDECTOMY (OPEN)     [3]   Family History  Problem Relation Name Age of Onset    No known problems Mother      No known problems Father     [4]   Social History  Tobacco Use    Smoking status: Never    Smokeless tobacco: Never   Vaping Use    Vaping status: Never Used   Substance Use Topics    Alcohol use: Never    Drug use: Never   [5] No Known Allergies  [6]   Current Outpatient Medications:     Ferrous Sulfate (IRON PO), Take by mouth, Disp: , Rfl:     lamoTRIgine  (LaMICtal ) 150 MG tablet, Take 1 tablet (150 mg) by mouth 2 (two) times daily, Disp: 180 tablet, Rfl: 3    Multiple Vitamin (multivitamin) capsule, Take 1 capsule by mouth daily, Disp: 90 capsule, Rfl: 3    VITAMIN D PO, Take by mouth, Disp: , Rfl:     acetaminophen  (TYLENOL ) 325 MG tablet, Take 2 tablets (650 mg) by mouth every 6 (six)  hours as needed for Pain (Patient not taking: Reported on 07/04/2024), Disp: 30 tablet, Rfl: 0    ondansetron  (ZOFRAN -ODT) 4 MG disintegrating tablet, Take 1 tablet (4 mg) by mouth every 6 (six) hours as needed for Nausea (Patient not taking: Reported on 07/04/2024), Disp: 8 tablet, Rfl: 0  [7]   Patient Active Problem List  Diagnosis    Nonintractable epilepsy without status epilepticus, unspecified epilepsy type (CMS/HCC)    Anxiety and depression    Irritability    Losing weight    Low TSH level

## 2024-07-04 NOTE — Telephone Encounter (Signed)
 Please call patient's mother to provide the phone number for both [facility] and [facility] to schedule NM thyroid  uptake scan. She can choose either place to get this imaging completed.     Dr. Waino Mounsey  [facility] [place] [facility]

## 2024-07-04 NOTE — Progress Notes (Signed)
 Sent fax to [facility] requesting patient's complete medical records. Fax successful at 2:36 PM on 07/04/24.

## 2024-07-05 NOTE — Telephone Encounter (Signed)
 Patient is returning call, please call back.

## 2024-07-12 ENCOUNTER — Ambulatory Visit
Attending: Student in an Organized Health Care Education/Training Program | Admitting: Student in an Organized Health Care Education/Training Program

## 2024-07-12 ENCOUNTER — Encounter: Payer: Self-pay | Admitting: Student in an Organized Health Care Education/Training Program

## 2024-07-12 VITALS — BP 113/72 | HR 76 | Temp 98.3°F | Resp 16 | Ht 62.0 in | Wt 126.0 lb

## 2024-07-12 DIAGNOSIS — F419 Anxiety disorder, unspecified: Secondary | ICD-10-CM | POA: Insufficient documentation

## 2024-07-12 DIAGNOSIS — G40909 Epilepsy, unspecified, not intractable, without status epilepticus: Secondary | ICD-10-CM | POA: Insufficient documentation

## 2024-07-12 DIAGNOSIS — R454 Irritability and anger: Secondary | ICD-10-CM | POA: Insufficient documentation

## 2024-07-12 DIAGNOSIS — F32A Depression, unspecified: Secondary | ICD-10-CM | POA: Insufficient documentation

## 2024-07-12 MED ORDER — LAMOTRIGINE 25 MG PO TABS: 25.0000 mg | ORAL_TABLET | Freq: Two times a day (BID) | ORAL | 0 refills | Status: AC

## 2024-07-12 MED ORDER — LAMOTRIGINE 150 MG PO TABS: 150.0000 mg | ORAL_TABLET | Freq: Two times a day (BID) | ORAL | 3 refills | Status: AC

## 2024-07-12 NOTE — Patient Instructions (Addendum)
-   Increase Lamotrigine  to 175mg  twice daily (one 150mg  tab + separate 25mg  tab to equal 175mg  twice daily)  --> If you would like you can start by taking 150mg  in AM and 175mg  in PM x 1 week then increase t 175mg  twice daily    - Try to take 175mg  twice daily for a minimum of 1 month at least (preferably 3 months) to see if it lessens the seizures    - The Lamotrigine  increase could help a bit more with mood stabilization as well    - Make appointment with Psychiatry  - For example: Dr. Brad Rosette a pyshciatrist in [place] who appears to take Medicaid  Address: [address], [place], [place] [address]  Phone: [phone]  - You can also try to make an appointment with [facility] [phone], option #2 for psychiatry and medication management appointments).

## 2024-07-12 NOTE — Progress Notes (Signed)
  NEUROLOGY - EPILEPSY FOLLOW UP VISIT     Patient Name: Dominique Parker  MRN: [id number]  Primary Care MD: Leontine Jaycee Gustav [name], MD Phone: [phone]    Date: 07/12/2024     Diagnosis: Epilepsy  Chief Complaint   Patient presents with    Nonintractable epilepsy without status epilepticus, unspeci       Current AEDs: LTG 150mg  BID     Prior AEDs: LTG - on since 07/2020 (she has been tried on other medications but cannot remember names), level 09/30/21 - 4.5 on 100mg  BID, 125mg  BID      Seizure History: Dominique Parker is a 24 y.o. female with PMH developmental delay, anxiety/depression, and epilepsy who began having seizures at age 59 (tonic clonic initially and 5-6 lifetime absence type seizures). She was tried on different medications, but mom cannot recall the names. She was eventually started on Lamotrigine  in 07/2020 after her last GTC seizure which was after [place] the [place] . She had a absence type seizure 2 months ago but mom found out she was not being compliant with her Lamotrigine  100mg  BID at the time. They prefer that she continued on 100mg  BID at this time. She will have blood level drawn. Mom will obtain prior EEG/MRI reports and if we cannot obtain then will repeat studies.    Interval History:   Here with mom.    There have been a few days without seizures now.  She did have a seizure last week. Per patient in the past she has a vision change/color change, then feels fatigue, strange feeling then seizure. Does not feel it as much anymore.   Mom witnesses them still, holds her hand, notices all over her body is completely stiff and episode lasts 2 minutes. She stares and gets red in the face. Not responding.   It is the same as before.    Still taking Lamotrigine  150mg  twice daily.   During the week every other day and she can get 3 in one day sometimes.     They have changed her other medications a few times and there was not any change. She is referring to multivitamins and supplements  however.    Was a aripiprazole (abilify) before but has been off it for 1 year.     Seizure Type: absence vs focal impaired awareness  Desc: staring, not responding +/- urinary incontinence  Duration: 1-2 minutes  Frequency: 1-2/week (can cluster with 2 in one day) on LTG 150mg  BID; 08/28/23, 08/27/23 x 3 ,08/26/23 x 4, April 2024 - 3; March 2024 - 6; prior - 1/week; 01/20/22, 1 every 1-2 weeks; 5-6 time total    Seizure Type:  GTC  Desc: LOC, TC, +tongue bite, incontinence, foaming  Duration: first one 7-10 minutes, later ones 2 minutes or less  Frequency: first at age 40; thereafter sometimes in 1 month, she could have 2-4 seizures, other months she may not have any. Last was 07/2020.    Medications:  Current Outpatient Medications on File Prior to Visit   Medication Sig Dispense Refill    Ferrous Sulfate (IRON PO) Take by mouth      Multiple Vitamin (multivitamin) capsule Take 1 capsule by mouth daily 90 capsule 3    VITAMIN D PO Take by mouth      [DISCONTINUED] lamoTRIgine  (LaMICtal ) 150 MG tablet Take 1 tablet (150 mg) by mouth 2 (two) times daily 180 tablet 3    [DISCONTINUED] acetaminophen  (TYLENOL ) 325 MG tablet Take 2 tablets (  650 mg) by mouth every 6 (six) hours as needed for Pain (Patient not taking: Reported on 07/12/2024) 30 tablet 0    [DISCONTINUED] ondansetron  (ZOFRAN -ODT) 4 MG disintegrating tablet Take 1 tablet (4 mg) by mouth every 6 (six) hours as needed for Nausea (Patient not taking: Reported on 07/12/2024) 8 tablet 0     No current facility-administered medications on file prior to visit.       Past Medical History:   Diagnosis Date    Convulsions (CMS/HCC)     Depression        Family History   Problem Relation Name Age of Onset    No known problems Mother      No known problems Father         Past Surgical History:   Procedure Laterality Date    APPENDECTOMY (OPEN)         Social History     Tobacco Use    Smoking status: Never    Smokeless tobacco: Never   Vaping Use    Vaping status: Never Used    Substance Use Topics    Alcohol use: Never    Drug use: Never       Other Social History:  Housing: lives with mom and dad, son, daughter in law  Driving: no  Education Level: 10th grade  Employment: not working    Allergies: No Known Allergies    PHYSICAL EXAM:  BP 113/72 (BP Site: Left arm, Patient Position: Sitting, Cuff Size: Medium)   Pulse 76   Temp 98.3 F (36.8 C) (Oral)   Resp 16   Ht 1.575 m (5' 2)   Wt 57.2 kg (126 lb)   LMP 06/27/2024 (Exact Date)   SpO2 99%   BMI 23.05 kg/m   Gen:  Well-developed, well-nourished.  No acute distress.  Cooperative with exam.  HEENT:  Normocephalic, atraumatic.  Neck: Normal range of motion.   Lungs:  Normal effort.  Psych: Flat affect    Neuro Exam:  MENTAL STATUS:  Awake, alert, oriented.  Follows commands.  Speech is fluent, non dysarthric.  CRANIAL NERVES:    CN III, IV, VI -  EOMI.    CN VII - Facial movements symmetric.   CN VIII - Hearing intact to conversational speech.   MOTOR:  No pronator drift.  Strength is full (5/5) and symmetric  SENSATION:  Intact to light touch in upper and lower extremities bilaterally.   GAIT: Normal gait      LAB & TEST RESULTS:    LTG level 09/30/21 - 4.5    Results for orders placed or performed in visit on 04/26/24   TSH    Collection Time: 05/01/24  9:09 AM   Result Value Ref Range    TSH 0.019 (L) 0.450 - 4.500 uIU/mL   T3, Free    Collection Time: 05/01/24  9:09 AM   Result Value Ref Range    T3, Free 3.7 2.0 - 4.4 pg/mL   T4, Free    Collection Time: 05/01/24  9:09 AM   Result Value Ref Range    T4, Free 1.57 0.82 - 1.77 ng/dL   Thyroid  Stimulating Immunoglobulin    Collection Time: 05/09/24  2:27 PM   Result Value Ref Range    Thyroid  Stimulating Immunoglobulin <0.10 0.00 - 0.55 IU/L   Thyroglobulin Antibody    Collection Time: 05/09/24  2:27 PM   Result Value Ref Range    THYROGLOBULIN AB <1.0 0.0 - 0.9 IU/mL  Thyroid  Peroxidase Antibody    Collection Time: 05/09/24  2:27 PM   Result Value Ref Range    Thyroid   Peroxidase (TPO) AB 9 0 - 34 IU/mL       IMAGES:   Independently reviewed:    No results found for this or any previous visit.      ASSESSMENT AND PLAN:  Dominique Parker is a 24 y.o. female presenting to me on 09/16/21 with PMH developmental delay, anxiety/depression, and epilepsy who began having seizures at age 27 (tonic clonic initially and 5-6 lifetime absence type seizures). She was tried on different medications, but mom cannot recall the names. She was eventually started on Lamotrigine  in 07/2020 after her last GTC seizure which was [place] . She had a absence type seizure 2 months ago but mom found out she was not being compliant with her Lamotrigine  100mg  BID at the time. They prefer that she continued on 100mg  BID at this time.     01/22/22:. She was having a small focal impaired awareness seizure once every 1-2 weeks per mom. Per mom, she feels she likely does not take her medication consistently because the amount of pills in the bottle does not seem to change much. She is extremely hesitant to increase her medication in general because she feels her seizures are triggered by anger for the most part and is insisting on not increasing.  LTG level on 09/30/21 was 4.5 on 100mg  BID. We discussed that if she was not reliably taking the medication, then she can continue on 100mg  BID for now. After taking it consistently for a month, we can reassess and if she is still having seizures, then increase to 150mg  BID.     03/26/22, she did not have any seizures since 01/20/22 since her mom started administering her medications. She is tolerating Lamotrigine  100mg  BID well. She did see new psychiatrist Dr. [place] but was screaming during the visit so it was cut short. They need to follow back up with her.    06/17/22: She had a possible breakthrough seizure on 06/15/21 in the setting of stress (states she lost her vision, had head heaviness, then some loss of awareness). Per mom, she also seems to still be  having occasional absence seizures. We again discussed increasing her Lamotrigine  slightly to 125mg  BID and then 150mg  BID. This could also help to stabilize mood. She is hesitant to increase however. Still need to see psychiatry.    08/19/22: She did increase LTG to 125mg  BID (but mom is still not sure if she is fully compliant). She continues to have seizure episodes with staring, not responding +/- urinary incontinence 1/week. Mom wants to observe how she does on 125mg  BID longer (while she makes sure she is taking medication properly). I seizures persist thereafter, they will increase to 150mg  BID. At some point we will need to check Lamotrigine  blood level and also consider EMU admission for further evaluation though she may not be on board with this.    10/22/22: Still on LTG 125mg  BID. Did not increase to 150mg  BID. Patient refuses. She had 6 small seizures in March and 3 small seizures in April (staring/not responding), so mom feels she improved slightly last month. She will continue on LTG 125mg  BID and needs to find new psychologist/psychiatrist - will try [facility].    09/06/23: For 2 weeks she was c/o eye discomfort, specifically abnormal R eye movement/sensation. On 08/26/23, she had 4 breakthrough FIA seizures (  with the first one had urinary incontinence). On 08/27/23, she had 3 total seizures (one in PCP office prompting her to go to ER). With all of them she had red eyes, froze, not responding. Each seizure lasted maybe around 2-3 minutes. In ER increased LTG to 150mg  BID per my advice. After the first day on higher dosage 08/28/23, she had a seizure while in the shower. That lasted longer, 5 minutes. Did not fall, mom was there. Today, in the morning after the shower, she felt a sensation that seizure might come back; lost energy and felt weak. We discussed continuing LTG 150mg  BID vs increasing to 175mg  BID and she prefers to stay on her current dosage. We discussed that she needs to see  psychiatry to manage her Abilify.    07/12/24: She continues to have FIA seizures 1-2 times per week (can have 2 in one day). Patient states in the past with her seizures she used to have a warning of vision/color change and feeling fatigued/strange before LOA but now does not have that. Mom witnesses these seizures, holds her hand, notices that her body is stiff all over, staring/responding, gets red in the face, lasts 2 minutes. This is the same as it has always been. She is still taking Lamotrigine  150mg  twice daily. She will increase LTG up to 175mg  BID. She has been off Abilify now for 1 year and has not established care with a new psychiatrist. They will establish care ASAP. Hopefully higher dosage of LTG helps mood stabilization as well.    1. Nonintractable epilepsy without status epilepticus, unspecified epilepsy type (CMS/HCC)  lamoTRIgine  (LaMICtal ) 150 MG tablet      2. Irritability  Referral to Adult Psychiatry & Behavioral Health    Referral to Adult Psychiatry & Behavioral Health      3. Anxiety and depression  Referral to Adult Psychiatry & Behavioral Health    Referral to Adult Psychiatry & Behavioral Health        - Increase Lamotrigine  to 175mg  twice daily (one 150mg  tab + separate 25mg  tab to equal 175mg  twice daily)  --> If you would like you can start by taking 150mg  in AM and 175mg  in PM x 1 week then increase t 175mg  twice daily    - Try to take 175mg  twice daily for a minimum of 1 month at least (preferably 3 months) to see if it lessens the seizures    - The Lamotrigine  increase could help a bit more with mood stabilization as well    - Make appointment with Psychiatry  - For example: Dr. Brad Rosette a pyshciatrist in [place] who appears to take Medicaid  Address: [address], [place], [place] [address]  Phone: [phone]  - You can also try to make an appointment with [facility] [phone], option #2 for psychiatry and medication management appointments).    I have  spent 40 minutes on the day of service including face to face time with patient, coordinating care, record review, and documentation.     Dominique LOIS Kays, MD   [facility]  Board Certified in Adult Neurology, Epilepsy, and Clinical Neurophysiology by the ABPN

## 2024-07-13 ENCOUNTER — Encounter (INDEPENDENT_AMBULATORY_CARE_PROVIDER_SITE_OTHER): Payer: Self-pay

## 2024-09-05 ENCOUNTER — Ambulatory Visit (INDEPENDENT_AMBULATORY_CARE_PROVIDER_SITE_OTHER): Admitting: Family Medicine

## 2024-09-22 ENCOUNTER — Ambulatory Visit (INDEPENDENT_AMBULATORY_CARE_PROVIDER_SITE_OTHER): Admitting: Endocrinology, Diabetes and Metabolism

## 2024-10-19 ENCOUNTER — Ambulatory Visit: Admitting: Student in an Organized Health Care Education/Training Program
